# Patient Record
Sex: Male | Born: 1960 | Race: White | Hispanic: No | Marital: Single | Smoking: Never smoker
Health system: Southern US, Community
[De-identification: ages and names within clinical notes are randomized; demographics above are authoritative.]

## PROBLEM LIST (undated history)

## (undated) DIAGNOSIS — Z86718 Personal history of other venous thrombosis and embolism: Secondary | ICD-10-CM

## (undated) DIAGNOSIS — I10 Essential (primary) hypertension: Secondary | ICD-10-CM

## (undated) DIAGNOSIS — G4730 Sleep apnea, unspecified: Secondary | ICD-10-CM

## (undated) DIAGNOSIS — M109 Gout, unspecified: Secondary | ICD-10-CM

## (undated) DIAGNOSIS — K219 Gastro-esophageal reflux disease without esophagitis: Secondary | ICD-10-CM

## (undated) DIAGNOSIS — J45909 Unspecified asthma, uncomplicated: Secondary | ICD-10-CM

## (undated) DIAGNOSIS — M359 Systemic involvement of connective tissue, unspecified: Secondary | ICD-10-CM

## (undated) HISTORY — PX: UVULOPALATOPHARYNGOPLASTY: SHX827

## (undated) HISTORY — PX: ACHILLES TENDON REPAIR: SUR1153

## (undated) HISTORY — PX: EYE SURGERY: SHX253

---

## 1997-11-29 ENCOUNTER — Other Ambulatory Visit: Admission: RE | Admit: 1997-11-29 | Discharge: 1997-11-29 | Payer: Self-pay | Admitting: Otolaryngology

## 2005-04-21 ENCOUNTER — Emergency Department: Payer: Self-pay | Admitting: Emergency Medicine

## 2005-04-21 ENCOUNTER — Ambulatory Visit: Payer: Self-pay | Admitting: Podiatry

## 2006-07-05 ENCOUNTER — Emergency Department: Payer: Self-pay | Admitting: Emergency Medicine

## 2006-07-05 ENCOUNTER — Other Ambulatory Visit: Payer: Self-pay

## 2010-07-09 ENCOUNTER — Observation Stay: Payer: Self-pay | Admitting: Internal Medicine

## 2010-07-09 DIAGNOSIS — I517 Cardiomegaly: Secondary | ICD-10-CM

## 2011-05-24 ENCOUNTER — Emergency Department: Payer: Self-pay | Admitting: *Deleted

## 2011-05-24 LAB — BASIC METABOLIC PANEL
Anion Gap: 14 (ref 7–16)
Calcium, Total: 8.6 mg/dL (ref 8.5–10.1)
Chloride: 103 mmol/L (ref 98–107)
Co2: 25 mmol/L (ref 21–32)
Creatinine: 0.83 mg/dL (ref 0.60–1.30)
Osmolality: 284 (ref 275–301)
Potassium: 4.1 mmol/L (ref 3.5–5.1)

## 2011-05-24 LAB — CBC
HGB: 13.8 g/dL (ref 13.0–18.0)
MCHC: 34.5 g/dL (ref 32.0–36.0)
RBC: 4.54 10*6/uL (ref 4.40–5.90)
RDW: 13.8 % (ref 11.5–14.5)
WBC: 6.9 10*3/uL (ref 3.8–10.6)

## 2011-05-24 LAB — TROPONIN I: Troponin-I: <0.02 ng/mL

## 2011-06-02 ENCOUNTER — Ambulatory Visit: Payer: Self-pay | Admitting: Internal Medicine

## 2011-06-07 ENCOUNTER — Ambulatory Visit: Payer: Self-pay | Admitting: Internal Medicine

## 2011-12-23 ENCOUNTER — Ambulatory Visit: Payer: Self-pay | Admitting: Internal Medicine

## 2012-10-29 IMAGING — CT CT CHEST W/ CM
1 series · 15 of 31 positions shown, 19 images · IV contrast (isovue)
Comparison: None

REASON FOR EXAM: soft tissue mass rt lung seen on xray
COMMENTS:

PROCEDURE:     KCT - KCT CHEST WITH CONTRAST  - June 07, 2011 [DATE]
RESULT:     Indication: Soft tissue mass right lung
TECHNIQUE: Multiple axial images of the chest are obtained 85 mL of Isovue
300 intravenous contrast.

[Series 2: chest w/ 5.0 i41f 3 · axial · 0.96mm/px · z∈[-22,+283]mm · 15 of 67 slices shown, 19 images]
[im 3/67  mediastinal]
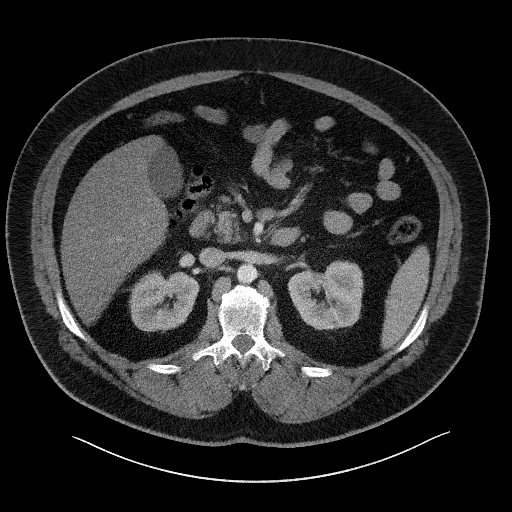
[im 3/67  lung]
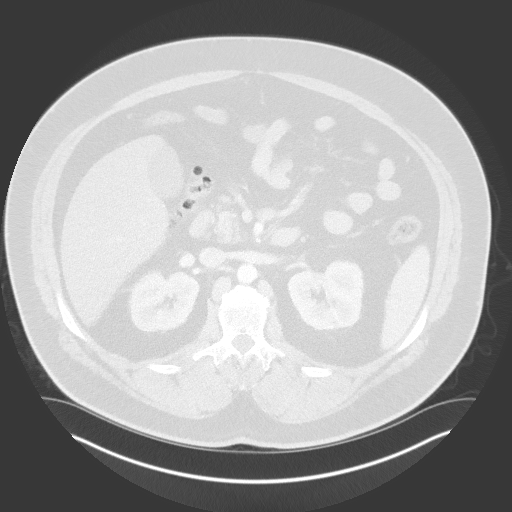
[im 8/67  lung]
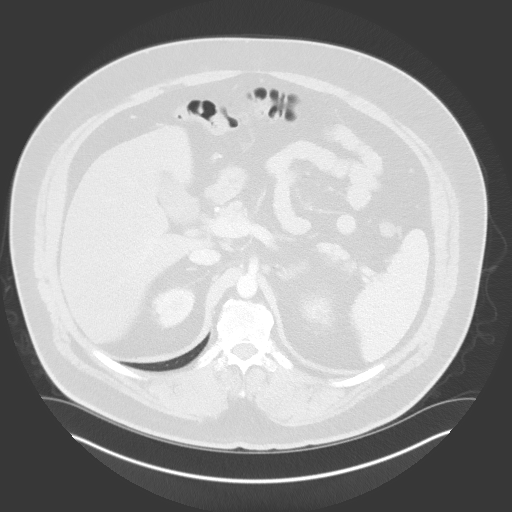
[im 13/67  lung]
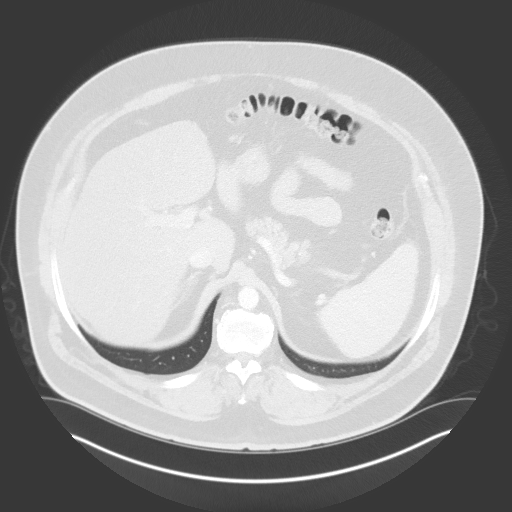
[im 15/67  lung]
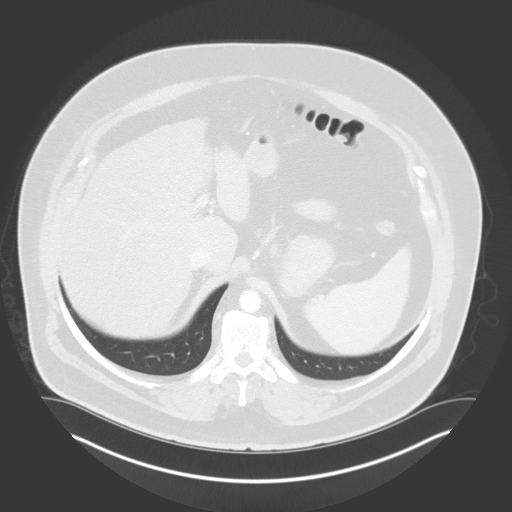
[im 20/67  mediastinal]
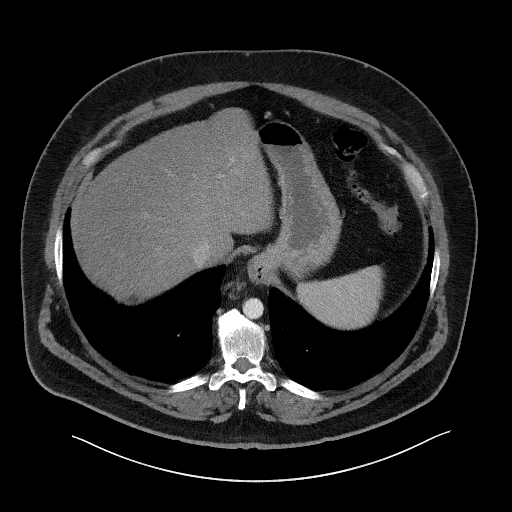
[im 20/67  lung]
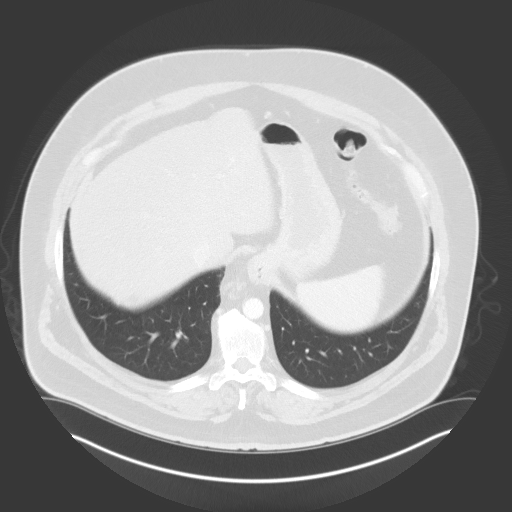
[im 25/67  lung]
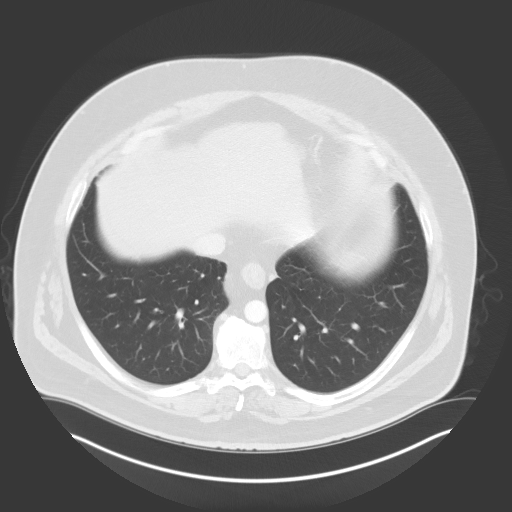
[im 30/67  lung]
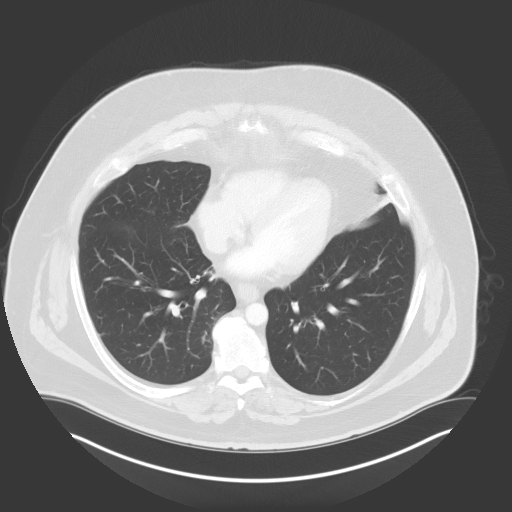
[im 35/67  lung]
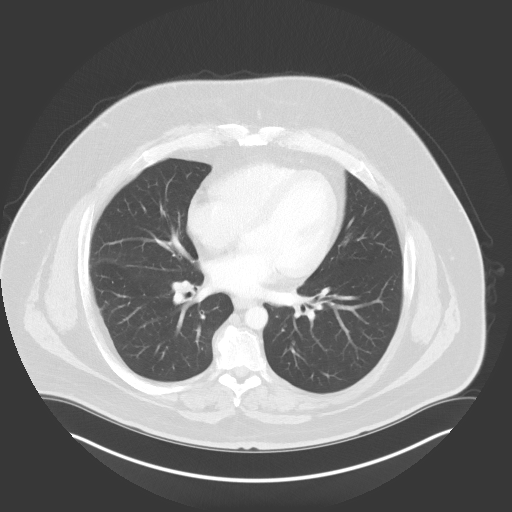
[im 37/67  mediastinal]
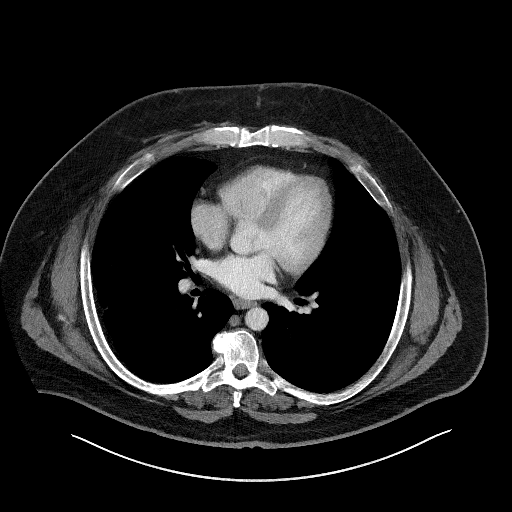
[im 37/67  lung]
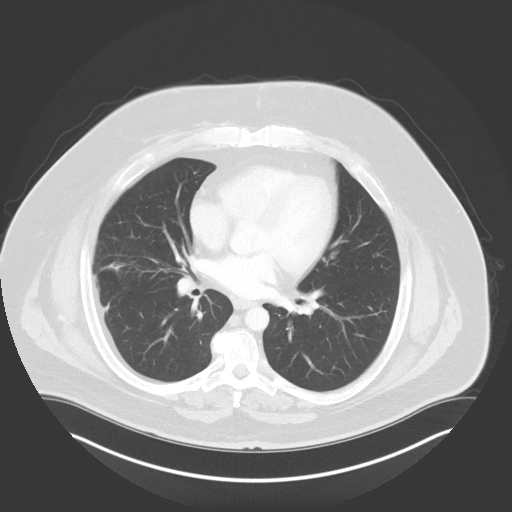
[im 42/67  lung]
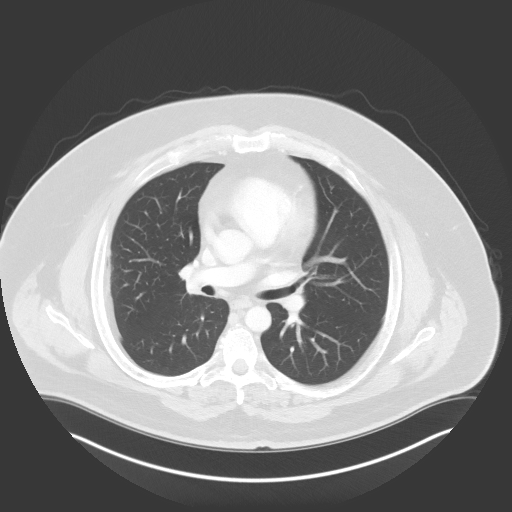
[im 47/67  lung]
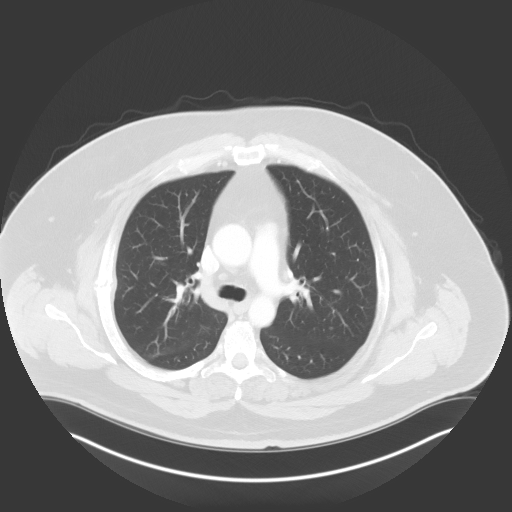
[im 52/67  lung]
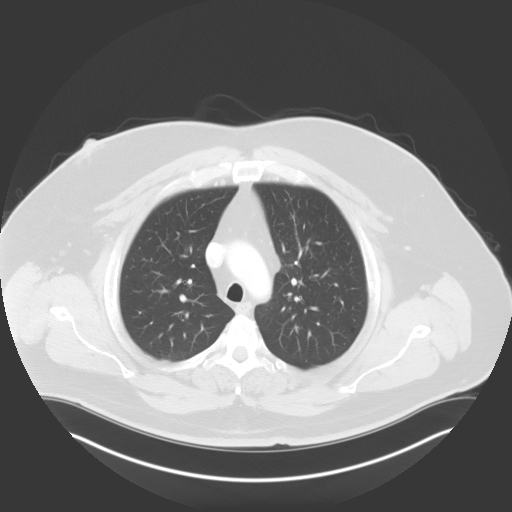
[im 54/67  mediastinal]
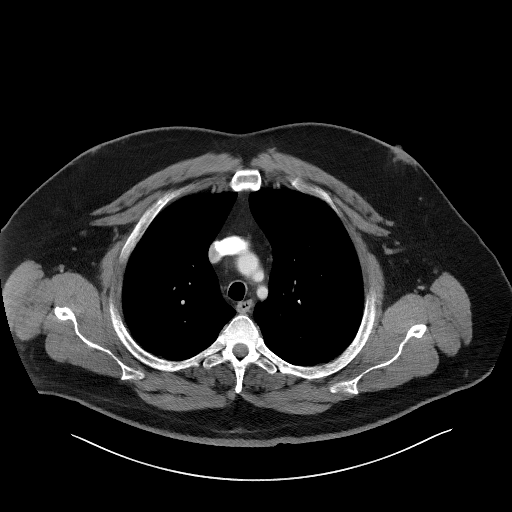
[im 54/67  lung]
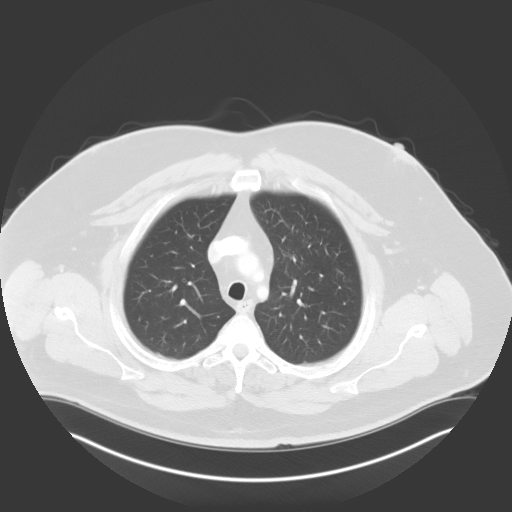
[im 59/67  lung]
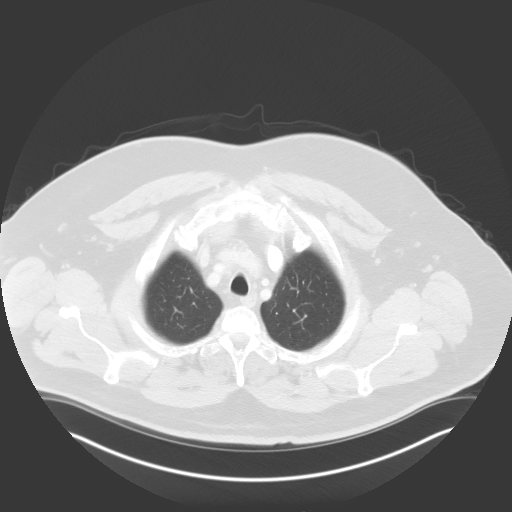
[im 64/67  lung]
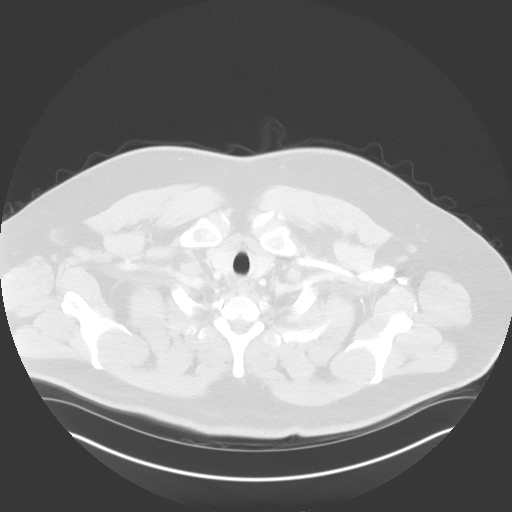

[15 of 31 positions shown; findings below may reference images not displayed]

FINDINGS: The central airways are patent. There is no pulmonary mass. There is no
focal consolidation. There is no pleural effusion or pneumothorax.

There are no pathologically enlarged axillary, hilar, or mediastinal lymph
nodes.

The heart size is normal. There is no pericardial effusion. The thoracic
aorta is normal in caliber.

Review of bone windows demonstrates no focal lytic or sclerotic lesions.
There is a nondisplaced fracture of the right lateral fifth rib with callus
formation a mild pleural thickening accounting for the lenticular shaped
pleural based opacity seen on recent radiograph.

Limited noncontrast images of the upper abdomen were obtained. The adrenal
glands appear normal. The liver is diffusely low in attenuation likely
secondary to hepatic steatosis..
IMPRESSION: 1.  There is a nondisplaced fracture of the right lateral fifth rib with
callus formation a mild pleural thickening accounting for the lenticular
shaped pleural based opacity seen on recent radiograph.

2. Hepatic steatosis.

## 2015-08-06 ENCOUNTER — Emergency Department: Payer: BLUE CROSS/BLUE SHIELD

## 2015-08-06 ENCOUNTER — Ambulatory Visit (INDEPENDENT_AMBULATORY_CARE_PROVIDER_SITE_OTHER)
Admission: EM | Admit: 2015-08-06 | Discharge: 2015-08-06 | Disposition: A | Discharge status: Other Acute Inpatient Hospital | Payer: BLUE CROSS/BLUE SHIELD | Source: Home / Self Care | Attending: Family Medicine | Admitting: Family Medicine

## 2015-08-06 ENCOUNTER — Observation Stay: Payer: BLUE CROSS/BLUE SHIELD

## 2015-08-06 ENCOUNTER — Encounter: Payer: Self-pay | Admitting: Emergency Medicine

## 2015-08-06 ENCOUNTER — Observation Stay (HOSPITAL_BASED_OUTPATIENT_CLINIC_OR_DEPARTMENT_OTHER)
Admit: 2015-08-06 | Discharge: 2015-08-06 | Disposition: A | Discharge status: Home / Self Care | Payer: BLUE CROSS/BLUE SHIELD | Attending: Internal Medicine | Admitting: Internal Medicine

## 2015-08-06 ENCOUNTER — Observation Stay
Admission: EM | Admit: 2015-08-06 | Discharge: 2015-08-07 | Disposition: A | Discharge status: Home / Self Care | Payer: BLUE CROSS/BLUE SHIELD | Attending: Internal Medicine | Admitting: Internal Medicine

## 2015-08-06 DIAGNOSIS — Z6841 Body Mass Index (BMI) 40.0 and over, adult: Secondary | ICD-10-CM | POA: Diagnosis not present

## 2015-08-06 DIAGNOSIS — R609 Edema, unspecified: Secondary | ICD-10-CM

## 2015-08-06 DIAGNOSIS — Z8249 Family history of ischemic heart disease and other diseases of the circulatory system: Secondary | ICD-10-CM | POA: Insufficient documentation

## 2015-08-06 DIAGNOSIS — G4730 Sleep apnea, unspecified: Secondary | ICD-10-CM | POA: Diagnosis not present

## 2015-08-06 DIAGNOSIS — Z566 Other physical and mental strain related to work: Secondary | ICD-10-CM | POA: Insufficient documentation

## 2015-08-06 DIAGNOSIS — M069 Rheumatoid arthritis, unspecified: Secondary | ICD-10-CM | POA: Diagnosis not present

## 2015-08-06 DIAGNOSIS — I890 Lymphedema, not elsewhere classified: Secondary | ICD-10-CM | POA: Insufficient documentation

## 2015-08-06 DIAGNOSIS — Z86718 Personal history of other venous thrombosis and embolism: Secondary | ICD-10-CM | POA: Diagnosis not present

## 2015-08-06 DIAGNOSIS — R0789 Other chest pain: Secondary | ICD-10-CM

## 2015-08-06 DIAGNOSIS — J45909 Unspecified asthma, uncomplicated: Secondary | ICD-10-CM | POA: Diagnosis not present

## 2015-08-06 DIAGNOSIS — J449 Chronic obstructive pulmonary disease, unspecified: Secondary | ICD-10-CM | POA: Diagnosis not present

## 2015-08-06 DIAGNOSIS — Z880 Allergy status to penicillin: Secondary | ICD-10-CM | POA: Diagnosis not present

## 2015-08-06 DIAGNOSIS — R079 Chest pain, unspecified: Secondary | ICD-10-CM

## 2015-08-06 DIAGNOSIS — E669 Obesity, unspecified: Secondary | ICD-10-CM | POA: Diagnosis not present

## 2015-08-06 DIAGNOSIS — M7989 Other specified soft tissue disorders: Secondary | ICD-10-CM | POA: Diagnosis not present

## 2015-08-06 HISTORY — DX: Systemic involvement of connective tissue, unspecified: M35.9

## 2015-08-06 HISTORY — DX: Personal history of other venous thrombosis and embolism: Z86.718

## 2015-08-06 HISTORY — DX: Unspecified asthma, uncomplicated: J45.909

## 2015-08-06 HISTORY — DX: Sleep apnea, unspecified: G47.30

## 2015-08-06 LAB — TROPONIN I
Troponin I: <0.03 ng/mL (ref <0.031)
Troponin I: <0.03 ng/mL (ref <0.031)

## 2015-08-06 LAB — FIBRIN DERIVATIVES D-DIMER (ARMC ONLY): Fibrin derivatives D-dimer (ARMC): 472 (ref 0–499)

## 2015-08-06 LAB — CREATININE, SERUM
CREATININE: 0.88 mg/dL (ref 0.61–1.24)
GFR calc non Af Amer: >60 mL/min (ref >60)

## 2015-08-06 LAB — BASIC METABOLIC PANEL
Anion gap: 4 — ABNORMAL LOW (ref 5–15)
BUN: 19 mg/dL (ref 6–20)
CALCIUM: 9 mg/dL (ref 8.9–10.3)
CO2: 28 mmol/L (ref 22–32)
CREATININE: 0.95 mg/dL (ref 0.61–1.24)
Chloride: 106 mmol/L (ref 101–111)
GFR calc non Af Amer: >60 mL/min (ref >60)
Glucose, Bld: 101 mg/dL — ABNORMAL HIGH (ref 65–99)
Potassium: 4.1 mmol/L (ref 3.5–5.1)
SODIUM: 138 mmol/L (ref 135–145)

## 2015-08-06 LAB — CBC
HCT: 40.7 % (ref 40.0–52.0)
Hemoglobin: 13.5 g/dL (ref 13.0–18.0)
MCH: 29.1 pg (ref 26.0–34.0)
MCHC: 33.3 g/dL (ref 32.0–36.0)
MCV: 87.6 fL (ref 80.0–100.0)
PLATELETS: 210 10*3/uL (ref 150–440)
RBC: 4.65 MIL/uL (ref 4.40–5.90)
RDW: 13.8 % (ref 11.5–14.5)
WBC: 7 10*3/uL (ref 3.8–10.6)

## 2015-08-06 LAB — LIPID PANEL
CHOL/HDL RATIO: 5.1 ratio
Cholesterol: 158 mg/dL (ref 0–200)
HDL: 31 mg/dL — ABNORMAL LOW (ref >40)
LDL CALC: 87 mg/dL (ref 0–99)
TRIGLYCERIDES: 200 mg/dL — AB (ref <150)
VLDL: 40 mg/dL (ref 0–40)

## 2015-08-06 MED ORDER — GI COCKTAIL ~~LOC~~
30.0000 mL | Freq: Once | ORAL | Status: AC
  Administered 2015-08-06: 30 mL via ORAL
  Filled 2015-08-06: qty 30

## 2015-08-06 MED ORDER — ASPIRIN EC 325 MG PO TBEC: 325.0000 mg | DELAYED_RELEASE_TABLET | Freq: Every day | ORAL | Status: DC

## 2015-08-06 MED ORDER — MOMETASONE FURO-FORMOTEROL FUM 200-5 MCG/ACT IN AERO
2.0000 | INHALATION_SPRAY | Freq: Two times a day (BID) | RESPIRATORY_TRACT | Status: DC
  Administered 2015-08-06: 2 via RESPIRATORY_TRACT
  Filled 2015-08-06 (×2): qty 8.8

## 2015-08-06 MED ORDER — ASPIRIN 81 MG PO CHEW
324.0000 mg | CHEWABLE_TABLET | Freq: Once | ORAL | Status: AC
  Administered 2015-08-06: 324 mg via ORAL
  Filled 2015-08-06: qty 4

## 2015-08-06 MED ORDER — SODIUM CHLORIDE 0.9% FLUSH
3.0000 mL | Freq: Two times a day (BID) | INTRAVENOUS | Status: DC
  Administered 2015-08-06: 3 mL via INTRAVENOUS

## 2015-08-06 MED ORDER — ALBUTEROL SULFATE (2.5 MG/3ML) 0.083% IN NEBU: 3.0000 mL | INHALATION_SOLUTION | Freq: Four times a day (QID) | RESPIRATORY_TRACT | Status: DC | PRN

## 2015-08-06 MED ORDER — IOPAMIDOL (ISOVUE-370) INJECTION 76%
100.0000 mL | Freq: Once | INTRAVENOUS | Status: AC | PRN
  Administered 2015-08-06: 100 mL via INTRAVENOUS

## 2015-08-06 MED ORDER — NITROGLYCERIN 0.4 MG SL SUBL: 0.4000 mg | SUBLINGUAL_TABLET | SUBLINGUAL | Status: DC | PRN

## 2015-08-06 MED ORDER — HEPARIN SODIUM (PORCINE) 5000 UNIT/ML IJ SOLN
5000.0000 [IU] | Freq: Three times a day (TID) | INTRAMUSCULAR | Status: DC
  Administered 2015-08-06 – 2015-08-07 (×2): 5000 [IU] via SUBCUTANEOUS
  Filled 2015-08-06 (×2): qty 1

## 2015-08-06 NOTE — Discharge Instructions (Signed)
Chest Pain Observation °It is often hard to give a specific diagnosis for the cause of chest pain. Among other possibilities your symptoms might be caused by inadequate oxygen delivery to your heart (angina). Angina that is not treated or evaluated can lead to a heart attack (myocardial infarction) or death. °Blood tests, electrocardiograms, and X-rays may have been done to help determine a possible cause of your chest pain. After evaluation and observation, your health care provider has determined that it is unlikely your pain was caused by an unstable condition that requires hospitalization. However, a full evaluation of your pain may need to be completed, with additional diagnostic testing as directed. It is very important to keep your follow-up appointments. Not keeping your follow-up appointments could result in permanent heart damage, disability, or death. If there is any problem keeping your follow-up appointments, you must call your health care provider. °HOME CARE INSTRUCTIONS  °Due to the slight chance that your pain could be angina, it is important to follow your health care provider's treatment plan and also maintain a healthy lifestyle: °· Maintain or work toward achieving a healthy weight. °· Stay physically active and exercise regularly. °· Decrease your salt intake. °· Eat a balanced, healthy diet. Talk to a dietitian to learn about heart-healthy foods. °· Increase your fiber intake by including whole grains, vegetables, fruits, and nuts in your diet. °· Avoid situations that cause stress, anger, or depression. °· Take medicines as advised by your health care provider. Report any side effects to your health care provider. Do not stop medicines or adjust the dosages on your own. °· Quit smoking. Do not use nicotine patches or gum until you check with your health care provider. °· Keep your blood pressure, blood sugar, and cholesterol levels within normal limits. °· Limit alcohol intake to no more than  1 drink per day for women who are not pregnant and 2 drinks per day for men. °· Do not abuse drugs. °SEEK IMMEDIATE MEDICAL CARE IF: °You have severe chest pain or pressure which may include symptoms such as: °· You feel pain or pressure in your arms, neck, jaw, or back. °· You have severe back or abdominal pain, feel sick to your stomach (nauseous), or throw up (vomit). °· You are sweating profusely. °· You are having a fast or irregular heartbeat. °· You feel short of breath while at rest. °· You notice increasing shortness of breath during rest, sleep, or with activity. °· You have chest pain that does not get better after rest or after taking your usual medicine. °· You wake from sleep with chest pain. °· You are unable to sleep because you cannot breathe. °· You develop a frequent cough or you are coughing up blood. °· You feel dizzy, faint, or experience extreme fatigue. °· You develop severe weakness, dizziness, fainting, or chills. °Any of these symptoms may represent a serious problem that is an emergency. Do not wait to see if the symptoms will go away. Call your local emergency services (911 in the [place] Do not drive yourself to the hospital. °MAKE SURE YOU: °· Understand these instructions. °· Will watch your condition. °· Will get help right away if you are not doing well or get worse. °  °This information is not intended to replace advice given to you by your health care provider. Make sure you discuss any questions you have with your health care provider. °  °Document Released: 04/03/2010 Document Revised: 03/06/2013 Document Reviewed: 08/31/2012 °Elsevier Interactive Patient   Education 2016 [employer]   Nonspecific Chest Pain It is often hard to find the cause of chest pain. There is always a chance that your pain could be related to something serious, such as a heart attack or a blood clot in your lungs. Chest pain can also be caused by conditions that are not life-threatening. If you have  chest pain, it is very important to follow up with your doctor.  HOME CARE  If you were prescribed an antibiotic medicine, finish it all even if you start to feel better.  Avoid any activities that cause chest pain.  Do not use any tobacco products, including cigarettes, chewing tobacco, or electronic cigarettes. If you need help quitting, ask your doctor.  Do not drink alcohol.  Take medicines only as told by your doctor.  Keep all follow-up visits as told by your doctor. This is important. This includes any further testing if your chest pain does not go away.  Your doctor may tell you to keep your head raised (elevated) while you sleep.  Make lifestyle changes as told by your doctor. These may include:  Getting regular exercise. Ask your doctor to suggest some activities that are safe for you.  Eating a heart-healthy diet. Your doctor or a diet specialist (dietitian) can help you to learn healthy eating options.  Maintaining a healthy weight.  Managing diabetes, if necessary.  Reducing stress. GET HELP IF:  Your chest pain does not go away, even after treatment.  You have a rash with blisters on your chest.  You have a fever. GET HELP RIGHT AWAY IF:  Your chest pain is worse.  You have an increasing cough, or you cough up blood.  You have severe belly (abdominal) pain.  You feel extremely weak.  You pass out (faint).  You have chills.  You have sudden, unexplained chest discomfort.  You have sudden, unexplained discomfort in your arms, back, neck, or jaw.  You have shortness of breath at any time.  You suddenly start to sweat, or your skin gets clammy.  You feel nauseous.  You vomit.  You suddenly feel light-headed or dizzy.  Your heart begins to beat quickly, or it feels like it is skipping beats. These symptoms may be an emergency. Do not wait to see if the symptoms will go away. Get medical help right away. Call your local emergency services (911  in the [place] Do not drive yourself to the hospital.   This information is not intended to replace advice given to you by your health care provider. Make sure you discuss any questions you have with your health care provider.   Document Released: 08/18/2007 Document Revised: 03/22/2014 Document Reviewed: 10/05/2013 Elsevier Interactive Patient Education [employer].

## 2015-08-06 NOTE — ED Notes (Signed)
Pt reports taking two Aspirin 325mg  p.o. Just prior to arrival to urgent care.

## 2015-08-06 NOTE — H&P (Signed)
[facility] [place] NAME: Sean Garza    MR#:  [id number]  DATE OF BIRTH:  30-Jul-1960  DATE OF ADMISSION:  08/06/2015  PRIMARY CARE PHYSICIAN: Albina Billet, MD   REQUESTING/REFERRING PHYSICIAN: Conni Slipper  CHIEF COMPLAINT:   Chief Complaint  Patient presents with  . Chest Pain    HISTORY OF PRESENT ILLNESS: Sean Garza  is a 55 y.o. 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 Today morning while he was at work started having serious substernal chest pain, which was pressure-like, as if somebody sitting on his chest. The pain was not associated with any activities, the breathing, movements. As the pain continued he took off from his work and went to urgent care Center after taking 2 aspirin tablets. Listening to his symptoms is because center suggested to go to emergency room right away and so he came here. His 2 troponins are negative, but ER physician spoke to on call cardiologist and patient has family history of coronary artery disease in his father and brother at almost same age as patient is having currently. So cardiology suggested to observe him in the hospital and possibly stress test tomorrow.  Patient's d-dimer is high but the CT scan of the chest is not done yet, and he also had complain of on and off swelling on his right leg since the last 11 years when he was diagnosed with DVT.  PAST MEDICAL HISTORY:   Past Medical History  Diagnosis Date  . Asthma   . Hx of blood clots   . Sleep apnea     PAST SURGICAL HISTORY: Past Surgical History  Procedure Laterality Date  . Achilles tendon repair      SOCIAL HISTORY:  Social History  Substance Use Topics  . Smoking status: Former Research scientist (life sciences)  . Smokeless tobacco: Not on file  . Alcohol Use: No    FAMILY HISTORY:  Family History  Problem Relation Age of Onset  . CAD Father   . CAD Brother      DRUG ALLERGIES:  Allergies  Allergen Reactions  . Penicillins Other (See Comments)    Reaction:  Unknown     REVIEW OF SYSTEMS:   CONSTITUTIONAL: No fever, fatigue or weakness.  EYES: No blurred or double vision.  EARS, NOSE, AND THROAT: No tinnitus or ear pain.  RESPIRATORY: No cough, shortness of breath, wheezing or hemoptysis.  CARDIOVASCULAR: positive for chest pain, orthopnea, edema.  GASTROINTESTINAL: No nausea, vomiting, diarrhea or abdominal pain.  GENITOURINARY: No dysuria, hematuria.  ENDOCRINE: No polyuria, nocturia,  HEMATOLOGY: No anemia, easy bruising or bleeding SKIN: No rash or lesion. MUSCULOSKELETAL: No joint pain or arthritis. Swelling.   NEUROLOGIC: No tingling, numbness, weakness.  PSYCHIATRY: No anxiety or depression.   MEDICATIONS AT HOME:  Prior to Admission medications   Medication Sig Start Date End Date Taking? Authorizing Provider  albuterol (PROVENTIL HFA;VENTOLIN HFA) 108 (90 Base) MCG/ACT inhaler Inhale into the lungs every 6 (six) hours as needed for wheezing or shortness of breath.    Historical Provider, MD  budesonide-formoterol (SYMBICORT) 160-4.5 MCG/ACT inhaler Inhale 2 puffs into the lungs 2 (two) times daily.    Historical Provider, MD  sildenafil (REVATIO) 20 MG tablet Take 20 mg by mouth 3 (three) times daily.    Historical Provider, MD      PHYSICAL EXAMINATION:   VITAL SIGNS: Blood pressure 122/82, pulse  68, temperature 97.8 F (36.6 C), temperature source Oral, resp. rate 16, height 5\' 8"  (1.727 m), weight 133.358 kg (294 lb), SpO2 97 %.  GENERAL:  55 y.o.-year-old patient lying in the bed with no acute distress.  EYES: Pupils equal, round, reactive to light and accommodation. No scleral icterus. Extraocular muscles intact.  HEENT: Head atraumatic, normocephalic. Oropharynx and nasopharynx clear.  NECK:  Supple, no jugular venous distention. No thyroid enlargement, no tenderness.  LUNGS: Normal breath sounds bilaterally, no  wheezing, rales,rhonchi or crepitation. No use of accessory muscles of respiration.  CARDIOVASCULAR: S1, S2 normal. No murmurs, rubs, or gallops.  ABDOMEN: Soft, nontender, nondistended. Bowel sounds present. No organomegaly or mass.  EXTREMITIES: No pedal edema, cyanosis, or clubbing.  Right leg is swollen, nonpitting, patient says it is chronically swollen. NEUROLOGIC: Cranial nerves II through XII are intact. Muscle strength 5/5 in all extremities. Sensation intact. Gait not checked.  PSYCHIATRIC: The patient is alert and oriented x 3.  SKIN: No obvious rash, lesion, or ulcer.   LABORATORY PANEL:   CBC  Recent Labs Lab 08/06/15 1318  WBC 7.0  HGB 13.5  HCT 40.7  PLT 210  MCV 87.6  MCH 29.1  MCHC 33.3  RDW 13.8   ------------------------------------------------------------------------------------------------------------------  Chemistries   Recent Labs Lab 08/06/15 1318  NA 138  K 4.1  CL 106  CO2 28  GLUCOSE 101*  BUN 19  CREATININE 0.95  CALCIUM 9.0   ------------------------------------------------------------------------------------------------------------------ estimated creatinine clearance is 118.7 mL/min (by C-G formula based on Cr of 0.95). ------------------------------------------------------------------------------------------------------------------ No results for input(s): TSH, T4TOTAL, T3FREE, THYROIDAB in the last 72 hours.  Invalid input(s): FREET3   Coagulation profile No results for input(s): INR, PROTIME in the last 168 hours. ------------------------------------------------------------------------------------------------------------------- No results for input(s): DDIMER in the last 72 hours. -------------------------------------------------------------------------------------------------------------------  Cardiac Enzymes  Recent Labs Lab 08/06/15 1318 08/06/15 1527  TROPONINI <0.03 <0.03    ------------------------------------------------------------------------------------------------------------------ Invalid input(s): POCBNP  ---------------------------------------------------------------------------------------------------------------  Urinalysis No results found for: COLORURINE, APPEARANCEUR, LABSPEC, PHURINE, GLUCOSEU, HGBUR, BILIRUBINUR, KETONESUR, PROTEINUR, UROBILINOGEN, NITRITE, LEUKOCYTESUR   RADIOLOGY: Dg Chest 2 View  08/06/2015  CLINICAL DATA:  Left side chest pain, dizziness starting 8 a.m. this morning EXAM: CHEST  2 VIEW COMPARISON:  06/07/2011 FINDINGS: Cardiomediastinal silhouette is stable. No acute infiltrate or pleural effusion. No pulmonary edema. Osteopenia and mild degenerative changes thoracic spine. IMPRESSION: No active cardiopulmonary disease. Electronically Signed   By: Lahoma Crocker M.D.   On: 08/06/2015 13:55    EKG: Orders placed or performed during the hospital encounter of 08/06/15  . ED EKG within 10 minutes  . ED EKG within 10 minutes  Normal sinus rhythm  IMPRESSION AND PLAN:  * Chest pain   As patient has strong family history and his ovaries, cardiology suggested to observe on telemetry and do a nuclear stress test tomorrow.   We'll follow serial troponin, give aspirin, check his lipid panel and hemoglobin A1c.  * Elevated d-dimer   Patient has history of DVT on the right leg 11 years ago, he did not took anticoagulation for more than 2 months at that time, since then he is on and off swelling of his right leg.   I will do a CT chest angiogram for pulmonary embolism and her DVT study on the right lower extremity.  * Asthma   No exacerbation, continue inhalers as home.   All the records are reviewed and case discussed with ED provider. Management plans discussed with the patient, family and they are  in agreement.  CODE STATUS: Full code Code Status History    This patient does not have a recorded code status. Please follow  your organizational policy for patients in this situation.       TOTAL TIME TAKING CARE OF THIS PATIENT: 50 minutes.    Vaughan Basta M.D on 08/06/2015   Between 7am to 6pm - Pager - [phone]  After 6pm go to [email or web address] - password [email or web address] [facility]  Office  [phone]  CC: Primary care physician; Albina Billet, MD   Note: This dictation was prepared with Dragon dictation along with smaller phrase technology. Any transcriptional errors that result from this process are unintentional.

## 2015-08-06 NOTE — ED Notes (Signed)
Pt resting in bed, family at bedside, pt in no acute distress 

## 2015-08-06 NOTE — ED Notes (Signed)
Patient transported to CT 

## 2015-08-06 NOTE — ED Provider Notes (Signed)
-----------------------------------------   5:31 PM on 08/06/2015 -----------------------------------------  Patient continues with chest pain. Patient's second troponin is negative. Patient has a very strong family history including his father with multiple MIs, brother of multiple MIs who has passed away. I discussed the patient with cardiology. I dosed a GI cocktail, pain remains. Given the patient's very strong family history and continued chest pain we will admit the patient to the hospital for further workup and evaluation. I discussed the patient with cardiology who is in agreement to this plan.  Harvest Dark, MD 08/06/15 [phone]

## 2015-08-06 NOTE — Progress Notes (Signed)
Patient admitted to unit from ED for chest pain/chest pressure, Patient alert and oriented, denies any pain at this time, VSS, family at bedside, Patient oriented to room and call system will continue to monitor

## 2015-08-06 NOTE — ED Provider Notes (Signed)
[facility] Emergency Department Provider Note   ____________________________________________  Time seen: Approximately 3:45 PM  I have reviewed the triage vital signs and the nursing notes.   HISTORY  Chief Complaint Chest Pain    HPI Sean Garza is a 55 y.o. male sent from urgent care. Patient reports he was at work and had a sharp stabbing chest pain lasted just a few seconds but since then he's had a dull achy heaviness in his chest ever since. He denies any shortness of breath he says what he usually has with his asthma denies any nausea vomiting or sweating. He is somewhat lightheaded he says. He reports his father had an MI at about his age and his brother had 2 MIs at about his age as well.  Past Medical History  Diagnosis Date  . Asthma   . Hx of blood clots   . Sleep apnea     There are no active problems to display for this patient.   Past Surgical History  Procedure Laterality Date  . Achilles tendon repair      Current Outpatient Rx  Name  Route  Sig  Dispense  Refill  . albuterol (PROVENTIL HFA;VENTOLIN HFA) 108 (90 Base) MCG/ACT inhaler   Inhalation   Inhale into the lungs every 6 (six) hours as needed for wheezing or shortness of breath.         . budesonide-formoterol (SYMBICORT) 160-4.5 MCG/ACT inhaler   Inhalation   Inhale 2 puffs into the lungs 2 (two) times daily.         . sildenafil (REVATIO) 20 MG tablet   Oral   Take 20 mg by mouth 3 (three) times daily.           Allergies Penicillins  No family history on file.  Social History Social History  Substance Use Topics  . Smoking status: Former Research scientist (life sciences)  . Smokeless tobacco: None  . Alcohol Use: No    Review of Systems Constitutional: No fever/chills Eyes: No visual changes. ENT: No sore throat. Cardiovascular: See history of present illness Respiratory: Denies shortness of breath. Gastrointestinal: No abdominal pain.  No nausea, no vomiting.   No diarrhea.  No constipation. Genitourinary: Negative for dysuria. Musculoskeletal: Negative for back pain. Skin: Negative for rash. Neurological: Negative for headaches, focal weakness or numbness. } 10-point ROS otherwise negative.  ____________________________________________   PHYSICAL EXAM:  VITAL SIGNS: ED Triage Vitals  Enc Vitals Group     BP 08/06/15 1439 144/91 mmHg     Pulse Rate 08/06/15 1320 71     Resp 08/06/15 1320 15     Temp 08/06/15 1320 97.8 F (36.6 C)     Temp Source 08/06/15 1320 Oral     SpO2 08/06/15 1320 97 %     Weight 08/06/15 1320 294 lb (133.358 kg)     Height 08/06/15 1320 5\' 8"  (1.727 m)     Head Cir --      Peak Flow --      Pain Score 08/06/15 1321 2     Pain Loc --      Pain Edu? --      Excl. in [place]? --     Constitutional: Alert and oriented. Well appearing and in no acute distress. Eyes: Conjunctivae are normal. PERRL. EOMI. Head: Atraumatic. Nose: No congestion/rhinnorhea. Mouth/Throat: Mucous membranes are moist.  Oropharynx non-erythematous. Neck: No stridor.   Cardiovascular: Normal rate, regular rhythm. Grossly normal heart sounds.  Good peripheral circulation.  Respiratory: Normal respiratory effort.  No retractions. Lungs CTAB. Gastrointestinal: Soft and nontender. No distention. No abdominal bruits. No CVA tenderness. {Musculoskeletal: No lower extremity tenderness nor edema.  No joint effusions. Neurologic:  Normal speech and language. No gross focal neurologic deficits are appreciated. No gait instability. Skin:  Skin is warm, dry and intact. No rash noted. Psychiatric: Mood and affect are normal. Speech and behavior are normal.  ____________________________________________   LABS (all labs ordered are listed, but only abnormal results are displayed)  Labs Reviewed  BASIC METABOLIC PANEL - Abnormal; Notable for the following:    Glucose, Bld 101 (*)    Anion gap 4 (*)    All other components within normal limits    CBC  TROPONIN I  FIBRIN DERIVATIVES D-DIMER ([facility] ONLY)  TROPONIN I   ____________________________________________  EKG EKG read and interpreted by me shows normal sinus rhythm rate of 72 normal axis no acute ST-T wave changes looks very similar to the EKG from urgent care which also looks very similar to an EKG from 05/24/2011. ____________________________________________  RADIOLOGY chest x-ray read by radiology as no acute disease ____________________________________________   PROCEDURES    ____________________________________________   INITIAL IMPRESSION / ASSESSMENT AND PLAN / ED COURSE  Pertinent labs & imaging results that were available during my care of the patient were reviewed by me and considered in my medical decision making (see chart for details).  I'm currently waiting for the second troponin plan is if this is negative we'll refer him to cardiology for follow-up tomorrow after discussing with them Dr. Ellyn Hack is on-call for unassigned patients. I will likely sign this patient out to Dr. Jacklynn Bue. ____________________________________________   FINAL CLINICAL IMPRESSION(S) / ED DIAGNOSES  Final diagnoses:  Chest pain, unspecified chest pain type      NEW MEDICATIONS STARTED DURING THIS VISIT:  New Prescriptions   No medications on file     Note:  This document was prepared using Dragon voice recognition software and may include unintentional dictation errors.    Nena Polio, MD 08/06/15 [phone]

## 2015-08-06 NOTE — ED Notes (Signed)
Pt comes into the ED via EMS from [facility] urgent care with the c/o chest pain that was initially a sharp pain centrally to the chest and then throughout the day it has become pressure.  Patient states he has mild dizziness today with the initial episode.  Patient in NAD at this time with even and unlabored respirations.

## 2015-08-06 NOTE — ED Provider Notes (Signed)
CSN: [id number]     Arrival date & time 08/06/15  1203 History   First MD Initiated Contact with Patient 08/06/15 1243    Nurses notes were reviewed. Chief Complaint  Patient presents with  . Chest Pain    Mid sternal to left sided chest pain starting at 0830 with a sharp pain and then feels heavy. Pain 3/10. Denies nausea or radiation.    Patient's here because of chest pain that was described as being substernal. He reports waking up this morning with substernal chest pain that was very sharp and intense. He had stabbing pain diaphoresis. The pain lasted several minutes since then he's had a heaviness and fullness in his chest they cannot shake. He describes stiffness as though something sitting on his chest. He denies any nausea vomiting and no radiation with pain. He did take 2 full aspirins before he came here. He does take low-dose Viagra but states she's not had it for several days. Past history denies hypertension diabetes he is obese and has a history of asthma and history of blood clots. He is a former smoker. And the strong family history of heart disease and a father and brother     (Consider location/radiation/quality/duration/timing/severity/associated sxs/prior Treatment) Patient is a 55 y.o. male presenting with chest pain. The history is provided by the patient, a significant other and a relative. No language interpreter was used.  Chest Pain Pain location:  Substernal area Pain quality: pressure, stabbing and tightness   Pain quality: not radiating   Pain radiates to:  Does not radiate Pain radiates to the back: no   Pain severity:  Moderate Onset quality:  Sudden Timing:  Constant Progression:  Waxing and waning Chronicity:  New Relieved by:  Nothing Worsened by:  Nothing tried Risk factors: male sex, obesity and prior DVT/PE     Past Medical History  Diagnosis Date  . Asthma   . Hx of blood clots    Past Surgical History  Procedure Laterality Date  . Achilles  tendon repair     History reviewed. No pertinent family history. Social History  Substance Use Topics  . Smoking status: Former Research scientist (life sciences)  . Smokeless tobacco: None  . Alcohol Use: No    Review of Systems  Cardiovascular: Positive for chest pain.  All other systems reviewed and are negative.   Allergies  Penicillins  Home Medications   Prior to Admission medications   Medication Sig Start Date End Date Taking? Authorizing Provider  albuterol (PROVENTIL HFA;VENTOLIN HFA) 108 (90 Base) MCG/ACT inhaler Inhale into the lungs every 6 (six) hours as needed for wheezing or shortness of breath.   Yes Historical Provider, MD  budesonide-formoterol (SYMBICORT) 160-4.5 MCG/ACT inhaler Inhale 2 puffs into the lungs 2 (two) times daily.   Yes Historical Provider, MD  sildenafil (REVATIO) 20 MG tablet Take 20 mg by mouth 3 (three) times daily.   Yes Historical Provider, MD   Meds Ordered and Administered this Visit  Medications - No data to display  BP 142/83 mmHg  Pulse 76  Temp(Src) 98.5 F (36.9 C) (Tympanic)  Resp 20  Ht 5\' 8"  (1.727 m)  Wt 300 lb (136.079 kg)  BMI 45.63 kg/m2  SpO2 97% No data found.   Physical Exam  Constitutional: He is oriented to person, place, and time. He appears well-developed and well-nourished.  Obese white male  HENT:  Head: Normocephalic and atraumatic.  Eyes: Conjunctivae are normal. Pupils are equal, round, and reactive to light.  Neck:  Normal range of motion. Neck supple.  Cardiovascular: Normal rate, regular rhythm and normal heart sounds.   Pulmonary/Chest: Effort normal and breath sounds normal.  Abdominal: Soft.  Musculoskeletal: Normal range of motion. He exhibits no tenderness.  Neurological: He is alert and oriented to person, place, and time.  Skin: Skin is warm and dry. No erythema.  Psychiatric: He has a normal mood and affect.  Vitals reviewed.   ED Course  Procedures (including critical care time)  Labs Review Labs Reviewed  - No data to display  Imaging Review No results found.   Visual Acuity Review  Right Eye Distance:   Left Eye Distance:   Bilateral Distance:    Right Eye Near:   Left Eye Near:    Bilateral Near:         MDM   1. Chest pain of uncertain etiology    Patient will be transferred to [facility] ED. Report given to nurse covering for charge nurse Marya Amsler at [facility] ED. Explained to patient significant other and daughter risk factors age weight and EKGs still has some worsening changes with Q-wave in 3 aVF and some prolongation of the QRS complex.  ED ECG REPORT I, Shyia Fillingim H, the attending physician, personally viewed and interpreted this ECG.   Date: 08/06/2015  EKG  Time12:07:25  Rate:77  Rhythm: there are no previous tracings available for comparison, normal sinus rhythm  Axis: 45  Intervals:none  ST&T Change: none EKG with Q waves in 3 and aVF also some prolongation of QRS complex.  Note: This dictation was prepared with Dragon dictation along with smaller phrase technology. Any transcriptional errors that result from this process are unintentional.    Frederich Cha, MD 08/06/15 1303

## 2015-08-06 NOTE — ED Notes (Signed)
Pt return from xray, resting in bed, family at bedside

## 2015-08-06 NOTE — ED Notes (Signed)
Patient transported to X-ray 

## 2015-08-07 ENCOUNTER — Observation Stay (HOSPITAL_BASED_OUTPATIENT_CLINIC_OR_DEPARTMENT_OTHER): Payer: BLUE CROSS/BLUE SHIELD

## 2015-08-07 ENCOUNTER — Encounter: Payer: Self-pay | Admitting: Radiology

## 2015-08-07 ENCOUNTER — Other Ambulatory Visit: Payer: BLUE CROSS/BLUE SHIELD

## 2015-08-07 ENCOUNTER — Observation Stay: Payer: BLUE CROSS/BLUE SHIELD

## 2015-08-07 DIAGNOSIS — G4730 Sleep apnea, unspecified: Secondary | ICD-10-CM | POA: Insufficient documentation

## 2015-08-07 DIAGNOSIS — M7989 Other specified soft tissue disorders: Secondary | ICD-10-CM | POA: Insufficient documentation

## 2015-08-07 DIAGNOSIS — Z8249 Family history of ischemic heart disease and other diseases of the circulatory system: Secondary | ICD-10-CM

## 2015-08-07 DIAGNOSIS — J449 Chronic obstructive pulmonary disease, unspecified: Secondary | ICD-10-CM | POA: Insufficient documentation

## 2015-08-07 DIAGNOSIS — Z880 Allergy status to penicillin: Secondary | ICD-10-CM | POA: Insufficient documentation

## 2015-08-07 DIAGNOSIS — E669 Obesity, unspecified: Secondary | ICD-10-CM

## 2015-08-07 DIAGNOSIS — J45909 Unspecified asthma, uncomplicated: Secondary | ICD-10-CM | POA: Insufficient documentation

## 2015-08-07 DIAGNOSIS — R609 Edema, unspecified: Secondary | ICD-10-CM

## 2015-08-07 DIAGNOSIS — Z566 Other physical and mental strain related to work: Secondary | ICD-10-CM

## 2015-08-07 DIAGNOSIS — R079 Chest pain, unspecified: Secondary | ICD-10-CM

## 2015-08-07 DIAGNOSIS — Z6841 Body Mass Index (BMI) 40.0 and over, adult: Secondary | ICD-10-CM | POA: Insufficient documentation

## 2015-08-07 DIAGNOSIS — Z86718 Personal history of other venous thrombosis and embolism: Secondary | ICD-10-CM

## 2015-08-07 DIAGNOSIS — I890 Lymphedema, not elsewhere classified: Secondary | ICD-10-CM | POA: Insufficient documentation

## 2015-08-07 DIAGNOSIS — M069 Rheumatoid arthritis, unspecified: Secondary | ICD-10-CM | POA: Insufficient documentation

## 2015-08-07 DIAGNOSIS — R0789 Other chest pain: Principal | ICD-10-CM | POA: Insufficient documentation

## 2015-08-07 LAB — NM MYOCAR MULTI W/SPECT W/WALL MOTION / EF
CHL CUP RESTING HR STRESS: 61 {beats}/min
CSEPED: 8 min
CSEPHR: 89 %
Estimated workload: 10.1 METS
Exercise duration (sec): 15 s
Peak HR: 148 {beats}/min

## 2015-08-07 LAB — CBC
HCT: 40.4 % (ref 40.0–52.0)
Hemoglobin: 13.7 g/dL (ref 13.0–18.0)
MCH: 29.4 pg (ref 26.0–34.0)
MCHC: 34 g/dL (ref 32.0–36.0)
MCV: 86.4 fL (ref 80.0–100.0)
PLATELETS: 201 10*3/uL (ref 150–440)
RBC: 4.67 MIL/uL (ref 4.40–5.90)
RDW: 13.9 % (ref 11.5–14.5)
WBC: 6.6 10*3/uL (ref 3.8–10.6)

## 2015-08-07 LAB — BASIC METABOLIC PANEL
Anion gap: 5 (ref 5–15)
BUN: 18 mg/dL (ref 6–20)
CHLORIDE: 106 mmol/L (ref 101–111)
CO2: 26 mmol/L (ref 22–32)
CREATININE: 0.8 mg/dL (ref 0.61–1.24)
Calcium: 8.6 mg/dL — ABNORMAL LOW (ref 8.9–10.3)
Glucose, Bld: 87 mg/dL (ref 65–99)
Potassium: 3.8 mmol/L (ref 3.5–5.1)
SODIUM: 137 mmol/L (ref 135–145)

## 2015-08-07 LAB — GLUCOSE, CAPILLARY: GLUCOSE-CAPILLARY: 88 mg/dL (ref 65–99)

## 2015-08-07 LAB — TROPONIN I

## 2015-08-07 LAB — ECHOCARDIOGRAM COMPLETE
Height: 68 in
WEIGHTICAEL: 4704 [oz_av]

## 2015-08-07 MED ORDER — TECHNETIUM TC 99M TETROFOSMIN IV KIT: 30.0000 | PACK | Freq: Once | INTRAVENOUS | Status: DC | PRN

## 2015-08-07 MED ORDER — ACETAMINOPHEN 325 MG PO TABS
650.0000 mg | ORAL_TABLET | Freq: Four times a day (QID) | ORAL | Status: DC | PRN
  Administered 2015-08-07: 650 mg via ORAL
  Filled 2015-08-07: qty 2

## 2015-08-07 NOTE — Progress Notes (Signed)
Per Dr. Rockey Situ, stress test was negative and patient does not need to stay to have second part done tomorrow. Can be discharged from his perspective. Waiting on [place] LE results. Will update Dr. Vianne Bulls.

## 2015-08-07 NOTE — Progress Notes (Signed)
Patient complaining of headache - thought it might because he hasn't eaten anything since yesterday. CBG was checked (88). Spoke with Dr. Vianne Bulls - order for 650 tylenol PO.

## 2015-08-07 NOTE — Consult Note (Signed)
Cardiology Consultation Note  Patient ID: Sean Garza, MRN: [id number], DOB/AGE: 05/15/1960 55 y.o. Admit date: 08/06/2015   Date of Consult: 08/07/2015 Primary Physician: Albina Billet, MD Primary Cardiologist: New to [facility] Requesting Physician: Doylene Canning, MD  Chief Complaint: Chest pain Reason for Consult: Chest pain  HPI: 55 y.o. 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  No prior cardiac history. He reports his father had an MI around the patient's current age and his brother had an MI "just a little older" leading to cardiac bypass surgery. Patient was a prior smoker in his 24's, though reports smoking no more than 10 cigarettes total. He denies any etoh or illegal drugs. He does not use stairs 2/2 SOB. He has never had chest pain like the pain he has been experiencing on 5/24.   Patient recently started a new job, which has been stressful. He was at work on 5/24 when he developed sharp chest pain that lasted for a few minutes and self resolved. This was followed by the development of an ache along his substernal region. He reported feeling like something was sitting on his chest. Some dizziness, otherwise no associated symptoms.  Upon the patient's arrival to [facility] they were found to have negative troponin x 3, .SCr 0.95, K+ 4.1-->3.8, D-dimer 476 (must be greater than 499 to be elevated), unremarkable CBC, LDL 87, A1C pending. ECG non-acute as below, CXR negative. CTA chest without evidence of central PE, peripheral vessel were poorly seen. He is currently chest pain free. IM has already ordered nuclear stress test.     Past Medical History  Diagnosis Date  . Asthma   . Hx of blood clots   . Sleep apnea   . Collagen vascular disease ([place])     Rhematoid Arthritis      Most Recent Cardiac Studies: none   Surgical History:  Past Surgical History  Procedure Laterality Date  .  Achilles tendon repair       Home Meds: Prior to Admission medications   Medication Sig Start Date End Date Taking? Authorizing Provider  albuterol (PROVENTIL HFA;VENTOLIN HFA) 108 (90 Base) MCG/ACT inhaler Inhale 2 puffs into the lungs every 6 (six) hours as needed for wheezing or shortness of breath.    Yes Historical Provider, MD  budesonide-formoterol (SYMBICORT) 160-4.5 MCG/ACT inhaler Inhale 2 puffs into the lungs 2 (two) times daily.   Yes Historical Provider, MD    Inpatient Medications:  . aspirin EC  325 mg Oral Daily  . heparin  5,000 Units Subcutaneous Q8H  . mometasone-formoterol  2 puff Inhalation BID  . sodium chloride flush  3 mL Intravenous Q12H      Allergies:  Allergies  Allergen Reactions  . Penicillins Other (See Comments)    Reaction:  Unknown     Social History   Social History  . Marital Status: Single    Spouse Name: N/A  . Number of Children: N/A  . Years of Education: N/A   Occupational History  . Not on file.   Social History Main Topics  . Smoking status: Former Research scientist (life sciences)  . Smokeless tobacco: Not on file  . Alcohol Use: No  . Drug Use: No  . Sexual Activity: Not on file   Other Topics Concern  . Not on file   Social History Narrative     Family History  Problem Relation Age of  Onset  . CAD Father   . CAD Brother      Review of Systems: Review of Systems  Constitutional: Negative for fever, chills, weight loss, malaise/fatigue and diaphoresis.  HENT: Negative for congestion.   Eyes: Negative for discharge and redness.  Respiratory: Negative for cough, hemoptysis, sputum production, shortness of breath and wheezing.   Cardiovascular: Positive for chest pain. Negative for palpitations, orthopnea, claudication, leg swelling and PND.  Gastrointestinal: Negative for heartburn and nausea.  Musculoskeletal: Negative for myalgias and falls.  Neurological: Positive for dizziness. Negative for tingling, tremors, sensory change, speech  change, focal weakness, loss of consciousness and weakness.  Endo/Heme/Allergies: Does not bruise/bleed easily.  Psychiatric/Behavioral: Negative for substance abuse. The patient is nervous/anxious.   All other systems reviewed and are negative.   Labs:  Recent Labs  08/06/15 1318 08/06/15 1527 08/06/15 1904 08/07/15 0155  TROPONINI <0.03 <0.03 <0.03 <0.03   Lab Results  Component Value Date   WBC 6.6 08/07/2015   HGB 13.7 08/07/2015   HCT 40.4 08/07/2015   MCV 86.4 08/07/2015   PLT 201 08/07/2015    Recent Labs Lab 08/07/15 0155  NA 137  K 3.8  CL 106  CO2 26  BUN 18  CREATININE 0.80  CALCIUM 8.6*  GLUCOSE 87   Lab Results  Component Value Date   CHOL 158 08/06/2015   HDL 31* 08/06/2015   LDLCALC 87 08/06/2015   TRIG 200* 08/06/2015   No results found for: DDIMER  Radiology/Studies:  Dg Chest 2 View  08/06/2015  CLINICAL DATA:  Left side chest pain, dizziness starting 8 a.m. this morning EXAM: CHEST  2 VIEW COMPARISON:  06/07/2011 FINDINGS: Cardiomediastinal silhouette is stable. No acute infiltrate or pleural effusion. No pulmonary edema. Osteopenia and mild degenerative changes thoracic spine. IMPRESSION: No active cardiopulmonary disease. Electronically Signed   By: Lahoma Crocker M.D.   On: 08/06/2015 13:55   Ct Angio Chest Pe W/cm &/or Wo Cm  08/06/2015  CLINICAL DATA:  Chest pain, right lower extremity DVT 11 years ago. Not on anticoagulation. Substernal chest pain. EXAM: CT ANGIOGRAPHY CHEST WITH CONTRAST TECHNIQUE: Multidetector CT imaging of the chest was performed using the standard protocol during bolus administration of intravenous contrast. Multiplanar CT image reconstructions and MIPs were obtained to evaluate the vascular anatomy. CONTRAST:  100 mL Isovue 370 COMPARISON:  None. FINDINGS: Mediastinum/Lymph Nodes: There is adequate opacification of the central pulmonary arteries, but the segmental and subsegmental branches are suboptimally opacified. There  is no central pulmonary embolus. The main pulmonary artery, right main pulmonary artery and left main pulmonary arteries are normal in size. The heart size is normal. There is no pericardial effusion. Normal caliber thoracic aorta. No thoracic aortic dissection. Lungs/Pleura: No pulmonary mass, infiltrate, or effusion. Upper abdomen: Small hiatal hernia. No other focal upper abdominal abnormality. Musculoskeletal: No chest wall mass or suspicious bone lesions identified. Anterior bridging osteophytes throughout the lower thoracic spine as can be seen with diffuse idiopathic skeletal hyperostosis. Review of the MIP images confirms the above findings. IMPRESSION: 1. There is adequate opacification of the central pulmonary arteries, but the segmental and subsegmental branches are suboptimally opacified. There is no central pulmonary embolus. Electronically Signed   By: Kathreen Devoid   On: 08/06/2015 18:39    EKG: NSR, 77 bpm, no acute st/t changes  Weights: Filed Weights   08/06/15 1320  Weight: 294 lb (133.358 kg)     Physical Exam: Blood pressure 122/80, pulse 60, temperature 97.6 F (36.4  C), temperature source Oral, resp. rate 19, height 5\' 8"  (1.727 m), weight 294 lb (133.358 kg), SpO2 98 %. Body mass index is 44.71 kg/(m^2). General: Well developed, well nourished, in no acute distress. Head: Normocephalic, atraumatic, sclera non-icteric, no xanthomas, nares are without discharge.  Neck: Negative for carotid bruits. JVD not elevated. Lungs: Clear bilaterally to auscultation without wheezes, rales, or rhonchi. Breathing is unlabored. Heart: RRR with S1 S2. No murmurs, rubs, or gallops appreciated. Abdomen: Obese, soft, non-tender, non-distended with normoactive bowel sounds. No hepatomegaly. No rebound/guarding. No obvious abdominal masses. Msk:  Strength and tone appear normal for age. Extremities: No clubbing or cyanosis. No edema.  Distal pedal pulses are 2+ and equal bilaterally. Neuro:  Alert and oriented X 3. No facial asymmetry. No focal deficit. Moves all extremities spontaneously. Psych:  Responds to questions appropriately with a normal affect.    Assessment and Plan:  Principal Problem:   Chest pain    1. Chest pain with moderate risk of cardiac etiology: -Currently chest pain free and has been so since last evening -Troponin negative x 3, CTA chest negative for central PE, unable to visualize peripheral vessels -He is already scheduled for nuclear stress test by IM -Further recommendations pending results -A1C pending -Lipid with LDL of 87 -Aspirin   2. Family history of premature CAD: -As above  3. Asthma: -Well controlled -Per IM  4. Obesity: -Weight loss advised   5. History of LE DVT: -11 years ago -Treated with Coumadin, no longer on   Signed, Ryan Dunn, PA-C Pager: [phone] 08/07/2015, 8:37 AM

## 2015-08-07 NOTE — Discharge Summary (Signed)
Sean Garza, is a 55 y.o. male  DOB 1960-12-19  MRN [id number].  Admission date:  08/06/2015  Admitting Physician  Vaughan Basta, MD  Discharge Date:  08/07/2015   Primary MD  Albina Billet, MD  Recommendations for primary care physician for things to follow:    follow up  With primary doctor in 1 week Admission Diagnosis  Swelling [R60.9] Chest pain [R07.9] Chest pain, unspecified chest pain type [R07.9]   Discharge Diagnosis  Swelling [R60.9] Chest pain [R07.9] Chest pain, unspecified chest pain type [R07.9]    Principal Problem:   Chest pain Active Problems:   Swelling   History of DVT (deep vein thrombosis)   Family history of coronary artery disease   Obesity   Stress at work      Past Medical History  Diagnosis Date  . Asthma   . Hx of blood clots   . Sleep apnea   . Collagen vascular disease ([place])     Rhematoid Arthritis    Past Surgical History  Procedure Laterality Date  . Achilles tendon repair         History of present illness and  Hospital Course:     Kindly see H&P for history of present illness and admission details, please review complete Labs, Consult reports and Test reports for all details in brief  HPI  from the history and physical done on the day of admission  55 year old male patient admitted for chest pain. Has strong family history of premature coronary artery disease.  Hospital Course  #1 chest pain likely musculoskeletal: Troponins are negative for 3 times. Patient had a stress test this morning negative for ischemia. EF 62%. Discharge home in stable condition. #2. History of DVT: Patient ultrasound of the legs did not show any DVT, CT of the chest did not show any PE. 3.h/o copd;no wheezing.    Discharge Condition:   Follow UP  Follow-up Information     Follow up with Please make an appointment with your PCP.        Discharge Instructions  and  Discharge Medications       Medication List    TAKE these medications        albuterol 108 (90 Base) MCG/ACT inhaler  Commonly known as:  PROVENTIL HFA;VENTOLIN HFA  Inhale 2 puffs into the lungs every 6 (six) hours as needed for wheezing or shortness of breath.     budesonide-formoterol 160-4.5 MCG/ACT inhaler  Commonly known as:  SYMBICORT  Inhale 2 puffs into the lungs 2 (two) times daily.          Diet and Activity recommendation: See Discharge Instructions above   Consults obtained - cardiology   Major procedures and Radiology Reports - PLEASE review detailed and final reports for all details, in brief -     Dg Chest 2 View  08/06/2015  CLINICAL DATA:  Left side chest pain, dizziness starting 8 a.m. this morning EXAM: CHEST  2 VIEW COMPARISON:  06/07/2011 FINDINGS: Cardiomediastinal silhouette is stable. No acute infiltrate or pleural effusion. No pulmonary edema. Osteopenia and mild degenerative changes thoracic spine. IMPRESSION: No active cardiopulmonary disease. Electronically Signed   By: Lahoma Crocker M.D.   On: 08/06/2015 13:55   Ct Angio Chest Pe W/cm &/or Wo Cm  08/06/2015  CLINICAL DATA:  Chest pain, right lower extremity DVT 11 years ago. Not on anticoagulation. Substernal chest pain. EXAM: CT ANGIOGRAPHY CHEST WITH CONTRAST TECHNIQUE: Multidetector CT imaging of the chest was  performed using the standard protocol during bolus administration of intravenous contrast. Multiplanar CT image reconstructions and MIPs were obtained to evaluate the vascular anatomy. CONTRAST:  100 mL Isovue 370 COMPARISON:  None. FINDINGS: Mediastinum/Lymph Nodes: There is adequate opacification of the central pulmonary arteries, but the segmental and subsegmental branches are suboptimally opacified. There is no central pulmonary embolus. The main pulmonary artery, right main pulmonary artery  and left main pulmonary arteries are normal in size. The heart size is normal. There is no pericardial effusion. Normal caliber thoracic aorta. No thoracic aortic dissection. Lungs/Pleura: No pulmonary mass, infiltrate, or effusion. Upper abdomen: Small hiatal hernia. No other focal upper abdominal abnormality. Musculoskeletal: No chest wall mass or suspicious bone lesions identified. Anterior bridging osteophytes throughout the lower thoracic spine as can be seen with diffuse idiopathic skeletal hyperostosis. Review of the MIP images confirms the above findings. IMPRESSION: 1. There is adequate opacification of the central pulmonary arteries, but the segmental and subsegmental branches are suboptimally opacified. There is no central pulmonary embolus. Electronically Signed   By: Kathreen Devoid   On: 08/06/2015 18:39   Nm Myocar Multi W/spect W/wall Motion / Leory Plowman  08/07/2015  Exercise myocardial perfusion imaging study with no significant  ischemia Normal wall motion, EF estimated at 62% No EKG changes concerning for ischemia at peak stress or in recovery. Hypertensive with exercise Low risk scan Signed, Esmond Plants, MD, Ph.D [facility]   US Venous Img Lower Unilateral Right  08/07/2015  CLINICAL DATA:  Right lower extremity swelling. EXAM: RIGHT LOWER EXTREMITY VENOUS DOPPLER ULTRASOUND TECHNIQUE: Gray-scale sonography with graded compression, as well as color Doppler and duplex ultrasound were performed to evaluate the lower extremity deep venous systems from the level of the common femoral vein and including the common femoral, femoral, profunda femoral, popliteal and calf veins including the posterior tibial, peroneal and gastrocnemius veins when visible. The superficial great saphenous vein was also interrogated. Spectral Doppler was utilized to evaluate flow at rest and with distal augmentation maneuvers in the common femoral, femoral and popliteal veins. COMPARISON:  None. FINDINGS: Contralateral Common  Femoral Vein: Respiratory phasicity is normal and symmetric with the symptomatic side. No evidence of thrombus. Normal compressibility. Common Femoral Vein: No evidence of thrombus. Normal compressibility, respiratory phasicity and response to augmentation. Saphenofemoral Junction: No evidence of thrombus. Normal compressibility and flow on color Doppler imaging. Profunda Femoral Vein: No evidence of thrombus. Normal compressibility and flow on color Doppler imaging. Femoral Vein: No evidence of thrombus. Normal compressibility, respiratory phasicity and response to augmentation. Popliteal Vein: No evidence of thrombus. Normal compressibility, respiratory phasicity and response to augmentation. Calf Veins: No evidence of thrombus. Normal compressibility and flow on color Doppler imaging. Superficial Great Saphenous Vein: No evidence of thrombus. Normal compressibility and flow on color Doppler imaging. Other Findings:  None. IMPRESSION: No evidence of deep venous thrombosis. Electronically Signed   By: Marcello Moores  Register   On: 08/07/2015 12:06    Micro Results    No results found for this or any previous visit (from the past 240 hour(s)).     Today   Subjective:   Sean Garza today has no headache,no chest abdominal pain,no new weakness tingling or numbness, feels much better wants to go home today.  Objective:   Blood pressure 100/59, pulse 68, temperature 97.5 F (36.4 C), temperature source Oral, resp. rate 18, height 5\' 8"  (1.727 m), weight 133.358 kg (294 lb), SpO2 95 %.   Intake/Output Summary (Last 24 hours) at 08/07/15  [place] filed at 08/07/15 1000  Gross per 24 hour  Intake      0 ml  Output      0 ml  Net      0 ml    Exam Awake Alert, Oriented x 3, No new F.N deficits, Normal affect [place].AT,PERRAL Supple Neck,No JVD, No cervical lymphadenopathy appriciated.  Symmetrical Chest wall movement, Good air movement bilaterally, CTAB RRR,No Gallops,Rubs or new Murmurs, No  Parasternal Heave +ve B.Sounds, Abd Soft, Non tender, No organomegaly appriciated, No rebound -guarding or rigidity. No Cyanosis, Clubbing or edema, No new Rash or bruise  Data Review   CBC w Diff: Lab Results  Component Value Date   WBC 6.6 08/07/2015   WBC 6.9 05/24/2011   HGB 13.7 08/07/2015   HGB 13.8 05/24/2011   HCT 40.4 08/07/2015   HCT 40.0 05/24/2011   PLT 201 08/07/2015   PLT 249 05/24/2011    CMP: Lab Results  Component Value Date   NA 137 08/07/2015   NA 142 05/24/2011   K 3.8 08/07/2015   K 4.1 05/24/2011   CL 106 08/07/2015   CL 103 05/24/2011   CO2 26 08/07/2015   CO2 25 05/24/2011   BUN 18 08/07/2015   BUN 16 05/24/2011   CREATININE 0.80 08/07/2015   CREATININE 0.83 05/24/2011  .   Total Time in preparing paper work, data evaluation and todays exam - 47 minutes  Kandee Escalante M.D on 08/07/2015 at 2:06 PM    Note: This dictation was prepared with Dragon dictation along with smaller phrase technology. Any transcriptional errors that result from this process are unintentional.

## 2015-08-07 NOTE — Progress Notes (Signed)
Patient given discharge teaching and paperwork regarding medications, diet, follow-up appointments and activity. Patient understanding verbalized. No complaints at this time. IV and telemetry discontinued prior to leaving. Skin assessment as previously charted and vitals are stable; on room air. Patient being discharged to home. Family present during discharge teaching.

## 2015-08-08 ENCOUNTER — Other Ambulatory Visit: Payer: BLUE CROSS/BLUE SHIELD

## 2015-08-08 LAB — HEMOGLOBIN A1C: HEMOGLOBIN A1C: 5.4 % (ref 4.0–6.0)

## 2015-08-28 ENCOUNTER — Encounter: Payer: Self-pay | Admitting: *Deleted

## 2015-08-28 ENCOUNTER — Ambulatory Visit
Admission: EM | Admit: 2015-08-28 | Discharge: 2015-08-28 | Disposition: A | Discharge status: Home / Self Care | Payer: BLUE CROSS/BLUE SHIELD | Attending: Family Medicine | Admitting: Family Medicine

## 2015-08-28 DIAGNOSIS — H109 Unspecified conjunctivitis: Secondary | ICD-10-CM

## 2015-08-28 DIAGNOSIS — H02533 Eyelid retraction right eye, unspecified eyelid: Secondary | ICD-10-CM | POA: Diagnosis not present

## 2015-08-28 MED ORDER — MOXIFLOXACIN HCL 0.5 % OP SOLN: 1.0000 [drp] | Freq: Three times a day (TID) | OPHTHALMIC | Status: DC

## 2015-08-28 NOTE — ED Notes (Signed)
Right eye redness and edema x3 days. Pt awakens in the morning with yellow matting to right eye.

## 2015-08-28 NOTE — ED Provider Notes (Signed)
CSN: [id number]     Arrival date & time 08/28/15  1114 History   First MD Initiated Contact with Patient 08/28/15 1203     Chief Complaint  Patient presents with  . Eye Drainage  . Facial Swelling   (Consider location/radiation/quality/duration/timing/severity/associated sxs/prior Treatment) HPI  This a 55 year old male presents with right eye redness and edema and matting for about a week but has worsened over the last 3 days. He states when he awakens in the morning he has a yellow matting to his right eye. Is recently had his prescription redone for his eyes. His Acuity is 20/50 in the right eye 20/20 on the left eye and 20/20 both eyes. He does not wear contacts. He has no light sensitivity. He has no foreign body sensation. He does have a lid lag noticeable on the right. He denies any other neurological symptoms. He does state that his vision on his right eye is blurred. Denies any itching or burning.      Past Medical History  Diagnosis Date  . Asthma   . Hx of blood clots   . Sleep apnea   . Collagen vascular disease ([place])     Rhematoid Arthritis   Past Surgical History  Procedure Laterality Date  . Achilles tendon repair     Family History  Problem Relation Age of Onset  . CAD Father   . CAD Brother    Social History  Substance Use Topics  . Smoking status: Former Research scientist (life sciences)  . Smokeless tobacco: None  . Alcohol Use: No    Review of Systems  Constitutional: Positive for activity change. Negative for fever, chills and fatigue.  Eyes: Positive for discharge, redness and visual disturbance. Negative for photophobia and pain.  All other systems reviewed and are negative.   Allergies  Penicillins  Home Medications   Prior to Admission medications   Medication Sig Start Date End Date Taking? Authorizing Provider  budesonide-formoterol (SYMBICORT) 160-4.5 MCG/ACT inhaler Inhale 2 puffs into the lungs 2 (two) times daily.   Yes Historical Provider, MD  albuterol  (PROVENTIL HFA;VENTOLIN HFA) 108 (90 Base) MCG/ACT inhaler Inhale 2 puffs into the lungs every 6 (six) hours as needed for wheezing or shortness of breath.     Historical Provider, MD  moxifloxacin (VIGAMOX) 0.5 % ophthalmic solution Place 1 drop into the right eye 3 (three) times daily. 08/28/15   Lorin Picket, PA-C   Meds Ordered and Administered this Visit  Medications - No data to display  BP 134/82 mmHg  Pulse 71  Temp(Src) 98 F (36.7 C) (Oral)  Resp 16  Ht 5\' 8"  (1.727 m)  Wt 285 lb (129.275 kg)  BMI 43.34 kg/m2  SpO2 95% No data found.   Physical Exam  Constitutional: He is oriented to person, place, and time. He appears well-developed and well-nourished. No distress.  HENT:  Head: Normocephalic and atraumatic.  Right Ear: External ear normal.  Left Ear: External ear normal.  Eyes: EOM are normal. Pupils are equal, round, and reactive to light. Right eye exhibits discharge. Left eye exhibits no discharge. No scleral icterus.  Exam nation of the right eye shows [place] a small amount of conjunctivitis with residual matting over the lateral eyelashes and lateral canthus. He does have noticeable lid lag in the right eye. EOMs are intact and full. PERRLA. No foreign body is seen under the upper lids. There does not appear to be any photosensitivity. Woods lamp was not available to [place] today and we  do not have slit lamp  Neck: Normal range of motion. Neck supple.  Musculoskeletal: Normal range of motion. He exhibits edema and tenderness.  Lymphadenopathy:    He has no cervical adenopathy.  Neurological: He is alert and oriented to person, place, and time.  Skin: Skin is warm and dry. He is not diaphoretic.  Psychiatric: He has a normal mood and affect. His behavior is normal. Judgment and thought content normal.  Nursing note and vitals reviewed.   ED Course  Procedures (including critical care time)  Labs Review Labs Reviewed - No data to display  Imaging Review No results  found.   Visual Acuity Review  Right Eye Distance: 20/50 Left Eye Distance: 20/20 Bilateral Distance: 20/20  Right Eye Near:   Left Eye Near:    Bilateral Near:         MDM   1. Conjunctivitis of right eye   2. Lid retraction or lag, right    Discharge Medication List as of 08/28/2015 12:28 PM    START taking these medications   Details  moxifloxacin (VIGAMOX) 0.5 % ophthalmic solution Place 1 drop into the right eye 3 (three) times daily., Starting 08/28/2015, Until Discontinued, Print       Discharge Medication List as of 08/28/2015 12:28 PM    START taking these medications   Details  moxifloxacin (VIGAMOX) 0.5 % ophthalmic solution Place 1 drop into the right eye 3 (three) times daily., Starting 08/28/2015, Until Discontinued, Print      Plan: 1. Test/x-ray results and diagnosis reviewed with patient 2. rx as per orders; risks, benefits, potential side effects reviewed with patient 3. Recommend supportive treatment with Compresses as needed. I recommended that he be seen at [facility] eye for further evaluation and necessary treatment for the lid lag and conjunctivitis. The information was provided to the patient he will make the phone call appointment in 2 days. Treat him empirically with Vigamox. This is explained to him in detail 4. F/u prn if symptoms worsen or don't improve     Lorin Picket, PA-C 08/28/15 1248

## 2015-08-28 NOTE — Discharge Instructions (Signed)
Bacterial Conjunctivitis °Bacterial conjunctivitis, commonly called pink eye, is an inflammation of the clear membrane that covers the white part of the eye (conjunctiva). The inflammation can also happen on the underside of the eyelids. The blood vessels in the conjunctiva become inflamed, causing the eye to become red or pink. Bacterial conjunctivitis may spread easily from one eye to another and from person to person (contagious).  °CAUSES  °Bacterial conjunctivitis is caused by bacteria. The bacteria may come from your own skin, your upper respiratory tract, or from someone else with bacterial conjunctivitis. °SYMPTOMS  °The normally white color of the eye or the underside of the eyelid is usually pink or red. The pink eye is usually associated with irritation, tearing, and some sensitivity to light. Bacterial conjunctivitis is often associated with a thick, yellowish discharge from the eye. The discharge may turn into a crust on the eyelids overnight, which causes your eyelids to stick together. If a discharge is present, there may also be some blurred vision in the affected eye. °DIAGNOSIS  °Bacterial conjunctivitis is diagnosed by your caregiver through an eye exam and the symptoms that you report. Your caregiver looks for changes in the surface tissues of your eyes, which may point to the specific type of conjunctivitis. A sample of any discharge may be collected on a cotton-tip swab if you have a severe case of conjunctivitis, if your cornea is affected, or if you keep getting repeat infections that do not respond to treatment. The sample will be sent to a lab to see if the inflammation is caused by a bacterial infection and to see if the infection will respond to antibiotic medicines. °TREATMENT  °1. Bacterial conjunctivitis is treated with antibiotics. Antibiotic eyedrops are most often used. However, antibiotic ointments are also available. Antibiotics pills are sometimes used. Artificial tears or eye  washes may ease discomfort. °HOME CARE INSTRUCTIONS  °1. To ease discomfort, apply a cool, clean washcloth to your eye for 10-20 minutes, 3-4 times a day. °2. Gently wipe away any drainage from your eye with a warm, wet washcloth or a cotton ball. °3. Wash your hands often with soap and water. Use paper towels to dry your hands. °4. Do not share towels or washcloths. This may spread the infection. °5. Change or wash your pillowcase every day. °6. You should not use eye makeup until the infection is gone. °7. Do not operate machinery or drive if your vision is blurred. °8. Stop using contact lenses. Ask your caregiver how to sterilize or replace your contacts before using them again. This depends on the type of contact lenses that you use. °9. When applying medicine to the infected eye, do not touch the edge of your eyelid with the eyedrop bottle or ointment tube. °SEEK IMMEDIATE MEDICAL CARE IF:  °· Your infection has not improved within 3 days after beginning treatment. °· You had yellow discharge from your eye and it returns. °· You have increased eye pain. °· Your eye redness is spreading. °· Your vision becomes blurred. °· You have a fever or persistent symptoms for more than 2-3 days. °· You have a fever and your symptoms suddenly get worse. °· You have facial pain, redness, or swelling. °MAKE SURE YOU:  °· Understand these instructions. °· Will watch your condition. °· Will get help right away if you are not doing well or get worse. °  °This information is not intended to replace advice given to you by your health care provider. Make sure you   discuss any questions you have with your health care provider. °  °Document Released: 03/01/2005 Document Revised: 03/22/2014 Document Reviewed: 08/02/2011 °Elsevier Interactive Patient Education ©2016 [employer] ° °How to Use Eye Drops and Eye Ointments °HOW TO APPLY EYE DROPS °Follow these steps when applying eye drops: °2. Wash your hands. °3. Tilt your head  back. °4. Put a finger under your eye and use it to gently pull your lower lid downward. Keep that finger in place. °5. Using your other hand, hold the dropper between your thumb and index finger. °6. Position the dropper just over the edge of the lower lid. Hold it as close to your eye as you can without touching the dropper to your eye. °7. Steady your hand. One way to do this is to lean your index finger against your brow. °8. Look up. °9. Slowly and gently squeeze one drop of medicine into your eye. °10. Close your eye. °11. Place a finger between your lower eyelid and your nose. Press gently for 2 minutes. This increases the amount of time that the medicine is exposed to the eye. It also reduces side effects that can develop if the drop gets into the bloodstream through the nose. °HOW TO APPLY EYE OINTMENTS °Follow these steps when applying eye ointments: °10. Wash your hands. °11. Put a finger under your eye and use it to gently pull your lower lid downward. Keep that finger in place. °12. Using your other hand, place the tip of the tube between your thumb and index finger with the remaining fingers braced against your cheek or nose. °13. Hold the tube just over the edge of your lower lid without touching the tube to your lid or eyeball. °14. Look up. °15. Line the inner part of your lower lid with ointment. °16. Gently pull up on your upper lid and look down. This will force the ointment to spread over the surface of the eye. °17. Release the upper lid. °18. If you can, close your eyes for 1-2 minutes. °Do not rub your eyes. If you applied the ointment correctly, your vision will be blurry for a few minutes. This is normal. °ADDITIONAL INFORMATION °· Make sure to use the eye drops or ointment as told by your health care provider. °· If you have been told to use both eye drops and an eye ointment, apply the eye drops first, then wait 3-4 minutes before you apply the ointment. °· Try not to touch the tip of the  dropper or tube to your eye. A dropper or tube that has touched the eye can become contaminated. °  °This information is not intended to replace advice given to you by your health care provider. Make sure you discuss any questions you have with your health care provider. °  °Document Released: 06/07/2000 Document Revised: 07/16/2014 Document Reviewed: 02/25/2014 °Elsevier Interactive Patient Education ©2016 Elsevier Inc. ° °

## 2016-12-10 ENCOUNTER — Encounter: Payer: Self-pay | Admitting: *Deleted

## 2016-12-13 ENCOUNTER — Ambulatory Visit: Payer: BLUE CROSS/BLUE SHIELD | Admitting: Anesthesiology

## 2016-12-13 ENCOUNTER — Encounter
Admission: RE | Disposition: A | Discharge status: Home / Self Care | Payer: Self-pay | Source: Ambulatory Visit | Attending: Unknown Physician Specialty

## 2016-12-13 ENCOUNTER — Ambulatory Visit
Admission: RE | Admit: 2016-12-13 | Discharge: 2016-12-13 | Disposition: A | Discharge status: Home / Self Care | Payer: BLUE CROSS/BLUE SHIELD | Source: Ambulatory Visit | Attending: Unknown Physician Specialty | Admitting: Unknown Physician Specialty

## 2016-12-13 ENCOUNTER — Encounter: Payer: Self-pay | Admitting: *Deleted

## 2016-12-13 DIAGNOSIS — Z1211 Encounter for screening for malignant neoplasm of colon: Secondary | ICD-10-CM | POA: Diagnosis not present

## 2016-12-13 DIAGNOSIS — Z9889 Other specified postprocedural states: Secondary | ICD-10-CM | POA: Insufficient documentation

## 2016-12-13 DIAGNOSIS — D123 Benign neoplasm of transverse colon: Secondary | ICD-10-CM | POA: Insufficient documentation

## 2016-12-13 DIAGNOSIS — J45909 Unspecified asthma, uncomplicated: Secondary | ICD-10-CM | POA: Insufficient documentation

## 2016-12-13 DIAGNOSIS — Z86718 Personal history of other venous thrombosis and embolism: Secondary | ICD-10-CM | POA: Insufficient documentation

## 2016-12-13 DIAGNOSIS — Z79899 Other long term (current) drug therapy: Secondary | ICD-10-CM | POA: Diagnosis not present

## 2016-12-13 DIAGNOSIS — G4730 Sleep apnea, unspecified: Secondary | ICD-10-CM | POA: Insufficient documentation

## 2016-12-13 DIAGNOSIS — M069 Rheumatoid arthritis, unspecified: Secondary | ICD-10-CM | POA: Insufficient documentation

## 2016-12-13 DIAGNOSIS — K621 Rectal polyp: Secondary | ICD-10-CM | POA: Insufficient documentation

## 2016-12-13 DIAGNOSIS — I10 Essential (primary) hypertension: Secondary | ICD-10-CM | POA: Insufficient documentation

## 2016-12-13 DIAGNOSIS — K640 First degree hemorrhoids: Secondary | ICD-10-CM | POA: Diagnosis not present

## 2016-12-13 DIAGNOSIS — E669 Obesity, unspecified: Secondary | ICD-10-CM | POA: Insufficient documentation

## 2016-12-13 DIAGNOSIS — Z87891 Personal history of nicotine dependence: Secondary | ICD-10-CM | POA: Diagnosis not present

## 2016-12-13 DIAGNOSIS — Z6841 Body Mass Index (BMI) 40.0 and over, adult: Secondary | ICD-10-CM | POA: Insufficient documentation

## 2016-12-13 DIAGNOSIS — M359 Systemic involvement of connective tissue, unspecified: Secondary | ICD-10-CM | POA: Diagnosis not present

## 2016-12-13 DIAGNOSIS — Z8249 Family history of ischemic heart disease and other diseases of the circulatory system: Secondary | ICD-10-CM | POA: Insufficient documentation

## 2016-12-13 DIAGNOSIS — K219 Gastro-esophageal reflux disease without esophagitis: Secondary | ICD-10-CM | POA: Insufficient documentation

## 2016-12-13 DIAGNOSIS — Z880 Allergy status to penicillin: Secondary | ICD-10-CM | POA: Insufficient documentation

## 2016-12-13 DIAGNOSIS — Z7951 Long term (current) use of inhaled steroids: Secondary | ICD-10-CM | POA: Insufficient documentation

## 2016-12-13 HISTORY — DX: Gout, unspecified: M10.9

## 2016-12-13 HISTORY — DX: Essential (primary) hypertension: I10

## 2016-12-13 HISTORY — DX: Gastro-esophageal reflux disease without esophagitis: K21.9

## 2016-12-13 HISTORY — PX: COLONOSCOPY WITH PROPOFOL: SHX5780

## 2016-12-13 SURGERY — COLONOSCOPY WITH PROPOFOL
Anesthesia: General

## 2016-12-13 MED ORDER — PROPOFOL 500 MG/50ML IV EMUL
INTRAVENOUS | Status: DC | PRN
  Administered 2016-12-13: 120 ug/kg/min via INTRAVENOUS

## 2016-12-13 MED ORDER — SODIUM CHLORIDE 0.9 % IV SOLN: INTRAVENOUS | Status: DC

## 2016-12-13 MED ORDER — PROPOFOL 10 MG/ML IV BOLUS
INTRAVENOUS | Status: AC
  Filled 2016-12-13: qty 20

## 2016-12-13 MED ORDER — PROPOFOL 10 MG/ML IV BOLUS
INTRAVENOUS | Status: DC | PRN
  Administered 2016-12-13: 90 mg via INTRAVENOUS

## 2016-12-13 MED ORDER — SODIUM CHLORIDE 0.9 % IV SOLN
INTRAVENOUS | Status: DC
  Administered 2016-12-13: 15:00:00 via INTRAVENOUS

## 2016-12-13 MED ORDER — FENTANYL CITRATE (PF) 100 MCG/2ML IJ SOLN
INTRAMUSCULAR | Status: AC
  Filled 2016-12-13: qty 2

## 2016-12-13 MED ORDER — PROPOFOL 500 MG/50ML IV EMUL
INTRAVENOUS | Status: AC
  Filled 2016-12-13: qty 50

## 2016-12-13 NOTE — Transfer of Care (Signed)
Immediate Anesthesia Transfer of Care Note  Patient: Sean Garza  Procedure(s) Performed: COLONOSCOPY WITH PROPOFOL (N/A )  Patient Location: PACU and Endoscopy Unit  Anesthesia Type:General  Level of Consciousness: drowsy and patient cooperative  Airway & Oxygen Therapy: Patient Spontanous Breathing and Patient connected to nasal cannula oxygen  Post-op Assessment: Report given to RN and Post -op Vital signs reviewed and stable  Post vital signs: Reviewed and stable  Last Vitals:  Vitals:   12/13/16 1238 12/13/16 1545  BP: (!) 149/95 123/61  Pulse: 75 68  Resp: 16 18  Temp: 37.1 C   SpO2: 99% 97%    Last Pain:  Vitals:   12/13/16 1238  TempSrc: Tympanic  PainSc: 3          Complications: No apparent anesthesia complications

## 2016-12-13 NOTE — Op Note (Signed)
[facility] Gastroenterology Patient Name: Sean Garza Procedure Date: 12/13/2016 3:02 PM MRN: [id number] Account #: [id number] Date of Birth: [birth year] Admit Type: Outpatient Age: 56 Room: [facility] ENDO ROOM 3 Gender: Male Note Status: Finalized Procedure:            Colonoscopy Indications:          Screening for colorectal malignant neoplasm Providers:            Manya Silvas, MD Referring MD:         Leona Carry. Hall Busing, MD (Referring MD) Medicines:            Propofol per Anesthesia Complications:        No immediate complications. Procedure:            Pre-Anesthesia Assessment:                       - After reviewing the risks and benefits, the patient                        was deemed in satisfactory condition to undergo the                        procedure.                       After obtaining informed consent, the colonoscope was                        passed under direct vision. Throughout the procedure,                        the patient's blood pressure, pulse, and oxygen                        saturations were monitored continuously. The                        Colonoscope was introduced through the anus and                        advanced to the the cecum, identified by appendiceal                        orifice and ileocecal valve. The colonoscopy was                        somewhat difficult. The patient tolerated the procedure                        well. The quality of the bowel preparation was adequate                        to identify polyps. Findings:      A diminutive polyp was found in the transverse colon. The polyp was       sessile. The polyp was removed with a jumbo cold forceps. Resection and       retrieval were complete.      A diminutive polyp was found in the rectum. The polyp was sessile. The       polyp was removed with a jumbo cold forceps. Resection and retrieval  were complete.      The exam was otherwise without  abnormality.      Internal hemorrhoids were found during endoscopy. The hemorrhoids were       small and Grade I (internal hemorrhoids that do not prolapse). Impression:           - One diminutive polyp in the transverse colon, removed                        with a jumbo cold forceps. Resected and retrieved.                       - One diminutive polyp in the rectum, removed with a                        jumbo cold forceps. Resected and retrieved.                       - The examination was otherwise normal.                       - Internal hemorrhoids. Recommendation:       - Await pathology results. Manya Silvas, MD 12/13/2016 3:40:22 PM This report has been signed electronically. Number of Addenda: 0 Note Initiated On: 12/13/2016 3:02 PM Scope Withdrawal Time: 0 hours 21 minutes 31 seconds  Total Procedure Duration: 0 hours 28 minutes 16 seconds       [facility]

## 2016-12-13 NOTE — H&P (Signed)
   Primary Care Physician:  Albina Billet, MD Primary Gastroenterologist:  Dr. Vira Agar  Pre-Procedure History & Physical: HPI:  Sean Garza is a 56 y.o. male is here for an colonoscopy.   Past Medical History:  Diagnosis Date  . Asthma   . Collagen vascular disease (H[facility])    Rhematoid Arthritis  . GERD (gastroesophageal reflux disease)   . Gout   . Hx of blood clots   . Hypertension   . Sleep apnea     Past Surgical History:  Procedure Laterality Date  . ACHILLES TENDON REPAIR    . EYE SURGERY     floppy eye syndrome surgery    Prior to Admission medications   Medication Sig Start Date End Date Taking? Authorizing Provider  albuterol (PROVENTIL HFA;VENTOLIN HFA) 108 (90 Base) MCG/ACT inhaler Inhale 2 puffs into the lungs every 6 (six) hours as needed for wheezing or shortness of breath.    Yes [provider]  budesonide-formoterol (SYMBICORT) 160-4.5 MCG/ACT inhaler Inhale 2 puffs into the lungs 2 (two) times daily.   Yes [provider]  sildenafil (REVATIO) 20 MG tablet Take 20 mg by mouth daily. As needed   Yes [provider]  moxifloxacin (VIGAMOX) 0.5 % ophthalmic solution Place 1 drop into the right eye 3 (three) times daily. 08/28/15   Lorin Picket, PA-C    Allergies as of 09/17/2016 - Review Complete 08/28/2015  Allergen Reaction Noted  . Penicillins Other (See Comments) 08/06/2015    Family History  Problem Relation Age of Onset  . CAD Father   . CAD Brother     Social History   Social History  . Marital status: Single    Spouse name: N/A  . Number of children: N/A  . Years of education: N/A   Occupational History  . Not on file.   Social History Main Topics  . Smoking status: Former Research scientist (life sciences)  . Smokeless tobacco: Never Used  . Alcohol use No  . Drug use: No  . Sexual activity: Not on file   Other Topics Concern  . Not on file   Social History Narrative  . No narrative on file    Review of Systems: See  HPI, otherwise negative ROS  Physical Exam: BP (!) 149/95   Pulse 75   Temp 98.8 F (37.1 C) (Tympanic)   Resp 16   Ht 5\' 8"  (1.727 m)   Wt 133.8 kg (295 lb)   SpO2 99%   BMI 44.85 kg/m  General:   Alert,  pleasant and cooperative in NAD,patient significantly obese. Head:  Normocephalic and atraumatic. Neck:  Supple; no masses or thyromegaly. Lungs:  Clear throughout to auscultation.    Heart:  Regular rate and rhythm. Abdomen:  Soft, nontender and nondistended. Normal bowel sounds, without guarding, and without rebound.   Neurologic:  Alert and  oriented x4;  grossly normal neurologically.  Impression/Plan: Sean Garza is here for an colonoscopy to be performed for colon screening.  Risks, benefits, limitations, and alternatives regarding  colonoscopy have been reviewed with the patient.  Questions have been answered.  All parties agreeable.   Gaylyn Cheers, MD  12/13/2016, 3:00 PM

## 2016-12-13 NOTE — Anesthesia Post-op Follow-up Note (Signed)
Anesthesia QCDR form completed.        

## 2016-12-13 NOTE — Anesthesia Preprocedure Evaluation (Signed)
Anesthesia Evaluation  Patient identified by MRN, date of birth, ID band Patient awake    Reviewed: Allergy & Precautions, NPO status , Patient's Chart, lab work & pertinent test results  History of Anesthesia Complications Negative for: history of anesthetic complications  Airway Mallampati: I  TM Distance: >3 FB Neck ROM: Full    Dental no notable dental hx.    Pulmonary asthma , sleep apnea (s/p corrective surgery) , former smoker,    breath sounds clear to auscultation- rhonchi (-) wheezing      Cardiovascular hypertension, Pt. on medications (-) CAD, (-) Past MI and (-) Cardiac Stents  Rhythm:Regular Rate:Normal - Systolic murmurs and - Diastolic murmurs    Neuro/Psych negative neurological ROS  negative psych ROS   GI/Hepatic Neg liver ROS, GERD  ,  Endo/Other  negative endocrine ROSneg diabetes  Renal/GU negative Renal ROS     Musculoskeletal negative musculoskeletal ROS (+)   Abdominal (+) + obese,   Peds  Hematology negative hematology ROS (+)   Anesthesia Other Findings Past Medical History: No date: Asthma No date: Collagen vascular disease ([place])     Comment:  Rhematoid Arthritis No date: GERD (gastroesophageal reflux disease) No date: Gout No date: Hx of blood clots No date: Hypertension No date: Sleep apnea   Reproductive/Obstetrics                             Anesthesia Physical Anesthesia Plan  ASA: II  Anesthesia Plan: General   Post-op Pain Management:    Induction: Intravenous  PONV Risk Score and Plan: 1 and Propofol infusion  Airway Management Planned: Natural Airway  Additional Equipment:   Intra-op Plan:   Post-operative Plan:   Informed Consent: I have reviewed the patients History and Physical, chart, labs and discussed the procedure including the risks, benefits and alternatives for the proposed anesthesia with the patient or authorized  representative who has indicated his/her understanding and acceptance.   Dental advisory given  Plan Discussed with: CRNA and Anesthesiologist  Anesthesia Plan Comments:         Anesthesia Quick Evaluation

## 2016-12-13 NOTE — Anesthesia Postprocedure Evaluation (Signed)
Anesthesia Post Note  Patient: Sean Garza  Procedure(s) Performed: COLONOSCOPY WITH PROPOFOL (N/A )  Patient location during evaluation: PACU Anesthesia Type: General Level of consciousness: awake and alert and oriented Pain management: pain level controlled Vital Signs Assessment: post-procedure vital signs reviewed and stable Respiratory status: spontaneous breathing Cardiovascular status: blood pressure returned to baseline Anesthetic complications: no     Last Vitals:  Vitals:   12/13/16 1238 12/13/16 1545  BP: (!) 149/95 123/61  Pulse: 75 68  Resp: 16 18  Temp: 37.1 C 36.6 C  SpO2: 99% 97%    Last Pain:  Vitals:   12/13/16 1545  TempSrc: Tympanic  PainSc:                  Karron Alvizo

## 2016-12-14 ENCOUNTER — Encounter: Payer: Self-pay | Admitting: Unknown Physician Specialty

## 2016-12-15 LAB — SURGICAL PATHOLOGY

## 2017-02-16 ENCOUNTER — Other Ambulatory Visit: Payer: Self-pay | Admitting: Student

## 2017-02-16 DIAGNOSIS — M7582 Other shoulder lesions, left shoulder: Secondary | ICD-10-CM

## 2017-02-24 ENCOUNTER — Ambulatory Visit
Admission: RE | Admit: 2017-02-24 | Discharge: 2017-02-24 | Disposition: A | Discharge status: Home / Self Care | Payer: BLUE CROSS/BLUE SHIELD | Source: Ambulatory Visit | Attending: Student | Admitting: Student

## 2017-02-24 DIAGNOSIS — M19012 Primary osteoarthritis, left shoulder: Secondary | ICD-10-CM | POA: Diagnosis not present

## 2017-02-24 DIAGNOSIS — M7582 Other shoulder lesions, left shoulder: Secondary | ICD-10-CM | POA: Diagnosis present

## 2017-02-28 DIAGNOSIS — M7582 Other shoulder lesions, left shoulder: Secondary | ICD-10-CM | POA: Insufficient documentation

## 2017-02-28 DIAGNOSIS — M75122 Complete rotator cuff tear or rupture of left shoulder, not specified as traumatic: Secondary | ICD-10-CM | POA: Insufficient documentation

## 2017-03-17 ENCOUNTER — Inpatient Hospital Stay: Admission: RE | Admit: 2017-03-17 | Payer: BLUE CROSS/BLUE SHIELD | Source: Ambulatory Visit

## 2017-03-21 ENCOUNTER — Encounter
Admission: RE | Admit: 2017-03-21 | Discharge: 2017-03-21 | Disposition: A | Discharge status: Home / Self Care | Payer: BLUE CROSS/BLUE SHIELD | Source: Ambulatory Visit | Attending: Surgery | Admitting: Surgery

## 2017-03-21 NOTE — Patient Instructions (Signed)
Your procedure is scheduled on:03/24/17 Report to Day Surgery. [facility] SECOND FLOOR To find out your arrival time please call [phone] between 1PM - 3PM on1/9/19.  Remember: Instructions that are not followed completely may result in serious medical risk, up to and including death, or upon the discretion of your surgeon and anesthesiologist your surgery may need to be rescheduled.     _X__ 1. Do not eat food after midnight the night before your procedure.                 No gum chewing or hard candies. You may drink clear liquids up to 2 hours                 before you are scheduled to arrive for your surgery- DO not drink clear                 liquids within 2 hours of the start of your surgery.                 Clear Liquids include:  water, apple juice without pulp, clear carbohydrate                 drink such as Clearfast of [place], Black Coffee or Tea (Do not add                 anything to coffee or tea).     _X__ 2.  No Alcohol for 24 hours before or after surgery.   _X__ 3.  Do Not Smoke or use e-cigarettes For 24 Hours Prior to Your Surgery.                 Do not use any chewable tobacco products for at least 6 hours prior to                 surgery.  ____  4.  Bring all medications with you on the day of surgery if instructed.   _X___  5.  Notify your doctor if there is any change in your medical condition      (cold, fever, infections).     Do not wear jewelry, make-up, hairpins, clips or nail polish. Do not wear lotions, powders, or perfumes. You may wear deodorant. Do not shave 48 hours prior to surgery. Men may shave face and neck. Do not bring valuables to the hospital.    [facility] is not responsible for any belongings or valuables.  Contacts, dentures or bridgework may not be worn into surgery. Leave your suitcase in the car. After surgery it may be brought to your room. For patients admitted to the hospital, discharge time is  determined by your treatment team.   Patients discharged the day of surgery will not be allowed to drive home.   Please read over the following fact sheets that you were given:   Surgical Site Infection Prevention   ____ Take these medicines the morning of surgery with A SIP OF WATER:    1. NONE  2.   3.   4.  5.  6.  ____ Fleet Enema (as directed)   _X_ Use CHG Soap as directed  _X___ Use inhalers on the day of surgery AND BRING  ____ Stop metformin 2 days prior to surgery    ____ Take 1/2 of usual insulin dose the night before surgery. No insulin the morning          of surgery.   ____ Stop  Coumadin/Plavix/aspirin on  ____ Stop Anti-inflammatories on    ____ Stop supplements until after surgery.    ____ Bring C-Pap to the hospital.

## 2017-03-23 ENCOUNTER — Encounter
Admission: RE | Admit: 2017-03-23 | Discharge: 2017-03-23 | Disposition: A | Discharge status: Home / Self Care | Payer: BLUE CROSS/BLUE SHIELD | Source: Ambulatory Visit | Attending: Surgery | Admitting: Surgery

## 2017-03-23 DIAGNOSIS — J45909 Unspecified asthma, uncomplicated: Secondary | ICD-10-CM | POA: Diagnosis not present

## 2017-03-23 DIAGNOSIS — M7502 Adhesive capsulitis of left shoulder: Secondary | ICD-10-CM | POA: Diagnosis not present

## 2017-03-23 DIAGNOSIS — Z79899 Other long term (current) drug therapy: Secondary | ICD-10-CM | POA: Diagnosis not present

## 2017-03-23 DIAGNOSIS — Z86718 Personal history of other venous thrombosis and embolism: Secondary | ICD-10-CM | POA: Diagnosis not present

## 2017-03-23 DIAGNOSIS — K219 Gastro-esophageal reflux disease without esophagitis: Secondary | ICD-10-CM | POA: Diagnosis not present

## 2017-03-23 DIAGNOSIS — M109 Gout, unspecified: Secondary | ICD-10-CM | POA: Diagnosis not present

## 2017-03-23 DIAGNOSIS — M75122 Complete rotator cuff tear or rupture of left shoulder, not specified as traumatic: Secondary | ICD-10-CM | POA: Diagnosis not present

## 2017-03-23 DIAGNOSIS — I10 Essential (primary) hypertension: Secondary | ICD-10-CM | POA: Diagnosis not present

## 2017-03-23 DIAGNOSIS — G4730 Sleep apnea, unspecified: Secondary | ICD-10-CM | POA: Diagnosis not present

## 2017-03-23 LAB — BASIC METABOLIC PANEL
ANION GAP: 8 (ref 5–15)
BUN: 16 mg/dL (ref 6–20)
CALCIUM: 8.9 mg/dL (ref 8.9–10.3)
CO2: 23 mmol/L (ref 22–32)
CREATININE: 0.91 mg/dL (ref 0.61–1.24)
Chloride: 104 mmol/L (ref 101–111)
GFR calc Af Amer: >60 mL/min (ref >60)
GLUCOSE: 93 mg/dL (ref 65–99)
Potassium: 4 mmol/L (ref 3.5–5.1)
Sodium: 135 mmol/L (ref 135–145)

## 2017-03-23 LAB — CBC
HCT: 41.8 % (ref 40.0–52.0)
HEMOGLOBIN: 13.9 g/dL (ref 13.0–18.0)
MCH: 29.2 pg (ref 26.0–34.0)
MCHC: 33.4 g/dL (ref 32.0–36.0)
MCV: 87.6 fL (ref 80.0–100.0)
PLATELETS: 238 10*3/uL (ref 150–440)
RBC: 4.77 MIL/uL (ref 4.40–5.90)
RDW: 14.4 % (ref 11.5–14.5)
WBC: 6.3 10*3/uL (ref 3.8–10.6)

## 2017-03-23 MED ORDER — CLINDAMYCIN PHOSPHATE 900 MG/50ML IV SOLN
900.0000 mg | Freq: Once | INTRAVENOUS | Status: AC
  Administered 2017-03-24: 900 mg via INTRAVENOUS

## 2017-03-24 ENCOUNTER — Ambulatory Visit: Payer: BLUE CROSS/BLUE SHIELD | Admitting: Certified Registered"

## 2017-03-24 ENCOUNTER — Other Ambulatory Visit: Payer: Self-pay

## 2017-03-24 ENCOUNTER — Encounter: Payer: Self-pay | Admitting: *Deleted

## 2017-03-24 ENCOUNTER — Ambulatory Visit
Admission: RE | Admit: 2017-03-24 | Discharge: 2017-03-24 | Disposition: A | Discharge status: Home / Self Care | Payer: BLUE CROSS/BLUE SHIELD | Source: Ambulatory Visit | Attending: Surgery | Admitting: Surgery

## 2017-03-24 ENCOUNTER — Encounter
Admission: RE | Disposition: A | Discharge status: Home / Self Care | Payer: Self-pay | Source: Ambulatory Visit | Attending: Surgery

## 2017-03-24 DIAGNOSIS — J45909 Unspecified asthma, uncomplicated: Secondary | ICD-10-CM | POA: Insufficient documentation

## 2017-03-24 DIAGNOSIS — Z79899 Other long term (current) drug therapy: Secondary | ICD-10-CM | POA: Insufficient documentation

## 2017-03-24 DIAGNOSIS — K219 Gastro-esophageal reflux disease without esophagitis: Secondary | ICD-10-CM | POA: Insufficient documentation

## 2017-03-24 DIAGNOSIS — M75122 Complete rotator cuff tear or rupture of left shoulder, not specified as traumatic: Secondary | ICD-10-CM | POA: Insufficient documentation

## 2017-03-24 DIAGNOSIS — I10 Essential (primary) hypertension: Secondary | ICD-10-CM | POA: Insufficient documentation

## 2017-03-24 DIAGNOSIS — S43432A Superior glenoid labrum lesion of left shoulder, initial encounter: Secondary | ICD-10-CM | POA: Insufficient documentation

## 2017-03-24 DIAGNOSIS — M109 Gout, unspecified: Secondary | ICD-10-CM | POA: Insufficient documentation

## 2017-03-24 DIAGNOSIS — M7502 Adhesive capsulitis of left shoulder: Secondary | ICD-10-CM | POA: Insufficient documentation

## 2017-03-24 DIAGNOSIS — G4730 Sleep apnea, unspecified: Secondary | ICD-10-CM | POA: Insufficient documentation

## 2017-03-24 DIAGNOSIS — Z86718 Personal history of other venous thrombosis and embolism: Secondary | ICD-10-CM | POA: Insufficient documentation

## 2017-03-24 HISTORY — PX: SHOULDER ARTHROSCOPY WITH OPEN ROTATOR CUFF REPAIR: SHX6092

## 2017-03-24 SURGERY — ARTHROSCOPY, SHOULDER WITH REPAIR, ROTATOR CUFF, OPEN
Anesthesia: Regional | Site: Shoulder | Laterality: Left | Wound class: Clean

## 2017-03-24 MED ORDER — OXYCODONE HCL 5 MG/5ML PO SOLN: 5.0000 mg | Freq: Once | ORAL | Status: DC | PRN

## 2017-03-24 MED ORDER — OXYCODONE HCL 5 MG PO TABS: 5.0000 mg | ORAL_TABLET | ORAL | 0 refills | Status: DC | PRN

## 2017-03-24 MED ORDER — LIDOCAINE HCL (PF) 1 % IJ SOLN
INTRAMUSCULAR | Status: DC | PRN
  Administered 2017-03-24: 3 mL

## 2017-03-24 MED ORDER — OXYCODONE HCL 5 MG PO TABS
5.0000 mg | ORAL_TABLET | ORAL | Status: DC | PRN
  Filled 2017-03-24: qty 2

## 2017-03-24 MED ORDER — GLYCOPYRROLATE 0.2 MG/ML IJ SOLN
INTRAMUSCULAR | Status: AC
  Filled 2017-03-24: qty 1

## 2017-03-24 MED ORDER — LACTATED RINGERS IV SOLN
INTRAVENOUS | Status: DC
  Administered 2017-03-24 (×2): via INTRAVENOUS

## 2017-03-24 MED ORDER — CLINDAMYCIN PHOSPHATE 900 MG/50ML IV SOLN
INTRAVENOUS | Status: AC
  Filled 2017-03-24: qty 50

## 2017-03-24 MED ORDER — SUCCINYLCHOLINE CHLORIDE 20 MG/ML IJ SOLN
INTRAMUSCULAR | Status: DC | PRN
  Administered 2017-03-24: 120 mg via INTRAVENOUS

## 2017-03-24 MED ORDER — FENTANYL CITRATE (PF) 100 MCG/2ML IJ SOLN
INTRAMUSCULAR | Status: AC
  Filled 2017-03-24: qty 2

## 2017-03-24 MED ORDER — DEXAMETHASONE SODIUM PHOSPHATE 10 MG/ML IJ SOLN
INTRAMUSCULAR | Status: DC | PRN
  Administered 2017-03-24: 5 mg via INTRAVENOUS

## 2017-03-24 MED ORDER — FENTANYL CITRATE (PF) 100 MCG/2ML IJ SOLN
INTRAMUSCULAR | Status: AC
  Administered 2017-03-24: 50 ug via INTRAVENOUS
  Filled 2017-03-24: qty 2

## 2017-03-24 MED ORDER — PROPOFOL 10 MG/ML IV BOLUS
INTRAVENOUS | Status: DC | PRN
  Administered 2017-03-24: 200 mg via INTRAVENOUS

## 2017-03-24 MED ORDER — ONDANSETRON HCL 4 MG/2ML IJ SOLN
INTRAMUSCULAR | Status: DC | PRN
  Administered 2017-03-24: 4 mg via INTRAVENOUS

## 2017-03-24 MED ORDER — LIDOCAINE HCL (PF) 2 % IJ SOLN
INTRAMUSCULAR | Status: AC
  Filled 2017-03-24: qty 10

## 2017-03-24 MED ORDER — FENTANYL CITRATE (PF) 100 MCG/2ML IJ SOLN
INTRAMUSCULAR | Status: AC
Start: 2017-03-24 — End: ?
  Filled 2017-03-24: qty 2

## 2017-03-24 MED ORDER — BUPIVACAINE HCL (PF) 0.5 % IJ SOLN
INTRAMUSCULAR | Status: AC
  Filled 2017-03-24: qty 10

## 2017-03-24 MED ORDER — FENTANYL CITRATE (PF) 100 MCG/2ML IJ SOLN
50.0000 ug | Freq: Once | INTRAMUSCULAR | Status: AC
  Administered 2017-03-24: 50 ug via INTRAVENOUS

## 2017-03-24 MED ORDER — GLYCOPYRROLATE 0.2 MG/ML IJ SOLN
INTRAMUSCULAR | Status: DC | PRN
  Administered 2017-03-24: 0.2 mg via INTRAVENOUS

## 2017-03-24 MED ORDER — LIDOCAINE HCL (PF) 1 % IJ SOLN
INTRAMUSCULAR | Status: AC
  Filled 2017-03-24: qty 5

## 2017-03-24 MED ORDER — ROCURONIUM BROMIDE 100 MG/10ML IV SOLN
INTRAVENOUS | Status: DC | PRN
  Administered 2017-03-24: 10 mg via INTRAVENOUS
  Administered 2017-03-24: 30 mg via INTRAVENOUS
  Administered 2017-03-24: 10 mg via INTRAVENOUS

## 2017-03-24 MED ORDER — METOCLOPRAMIDE HCL 5 MG/ML IJ SOLN: 5.0000 mg | Freq: Three times a day (TID) | INTRAMUSCULAR | Status: DC | PRN

## 2017-03-24 MED ORDER — SUGAMMADEX SODIUM 200 MG/2ML IV SOLN
INTRAVENOUS | Status: DC | PRN
  Administered 2017-03-24: 300 mg via INTRAVENOUS

## 2017-03-24 MED ORDER — FENTANYL CITRATE (PF) 100 MCG/2ML IJ SOLN
INTRAMUSCULAR | Status: DC | PRN
  Administered 2017-03-24 (×4): 50 ug via INTRAVENOUS

## 2017-03-24 MED ORDER — METOCLOPRAMIDE HCL 10 MG PO TABS: 5.0000 mg | ORAL_TABLET | Freq: Three times a day (TID) | ORAL | Status: DC | PRN

## 2017-03-24 MED ORDER — BUPIVACAINE LIPOSOME 1.3 % IJ SUSP
INTRAMUSCULAR | Status: DC | PRN
  Administered 2017-03-24: 20 mL via PERINEURAL

## 2017-03-24 MED ORDER — SUGAMMADEX SODIUM 500 MG/5ML IV SOLN
INTRAVENOUS | Status: AC
  Filled 2017-03-24: qty 5

## 2017-03-24 MED ORDER — MIDAZOLAM HCL 2 MG/2ML IJ SOLN
INTRAMUSCULAR | Status: AC
  Administered 2017-03-24: 1 mg via INTRAVENOUS
  Filled 2017-03-24: qty 2

## 2017-03-24 MED ORDER — BUPIVACAINE-EPINEPHRINE 0.5% -1:200000 IJ SOLN
INTRAMUSCULAR | Status: DC | PRN
  Administered 2017-03-24: 30 mL

## 2017-03-24 MED ORDER — ONDANSETRON HCL 4 MG/2ML IJ SOLN
INTRAMUSCULAR | Status: AC
  Filled 2017-03-24: qty 2

## 2017-03-24 MED ORDER — FAMOTIDINE 20 MG PO TABS
20.0000 mg | ORAL_TABLET | Freq: Once | ORAL | Status: AC
  Administered 2017-03-24: 20 mg via ORAL

## 2017-03-24 MED ORDER — LACTATED RINGERS IV SOLN
INTRAVENOUS | Status: DC | PRN
  Administered 2017-03-24: 2 mL

## 2017-03-24 MED ORDER — FENTANYL CITRATE (PF) 100 MCG/2ML IJ SOLN: 25.0000 ug | INTRAMUSCULAR | Status: DC | PRN

## 2017-03-24 MED ORDER — MIDAZOLAM HCL 2 MG/2ML IJ SOLN
INTRAMUSCULAR | Status: AC
  Filled 2017-03-24: qty 2

## 2017-03-24 MED ORDER — LIDOCAINE HCL (CARDIAC) 20 MG/ML IV SOLN
INTRAVENOUS | Status: DC | PRN
  Administered 2017-03-24: 50 mg via INTRAVENOUS

## 2017-03-24 MED ORDER — POTASSIUM CHLORIDE IN NACL 20-0.9 MEQ/L-% IV SOLN
INTRAVENOUS | Status: DC
  Filled 2017-03-24 (×5): qty 1000

## 2017-03-24 MED ORDER — MEPERIDINE HCL 50 MG/ML IJ SOLN: 6.2500 mg | INTRAMUSCULAR | Status: DC | PRN

## 2017-03-24 MED ORDER — MIDAZOLAM HCL 2 MG/2ML IJ SOLN
1.0000 mg | Freq: Once | INTRAMUSCULAR | Status: AC
  Administered 2017-03-24: 1 mg via INTRAVENOUS

## 2017-03-24 MED ORDER — ONDANSETRON HCL 4 MG/2ML IJ SOLN
4.0000 mg | Freq: Four times a day (QID) | INTRAMUSCULAR | Status: DC | PRN
  Administered 2017-03-24: 4 mg via INTRAVENOUS

## 2017-03-24 MED ORDER — SUCCINYLCHOLINE CHLORIDE 20 MG/ML IJ SOLN
INTRAMUSCULAR | Status: AC
  Filled 2017-03-24: qty 1

## 2017-03-24 MED ORDER — PROPOFOL 10 MG/ML IV BOLUS
INTRAVENOUS | Status: AC
  Filled 2017-03-24: qty 20

## 2017-03-24 MED ORDER — BUPIVACAINE HCL (PF) 0.5 % IJ SOLN
INTRAMUSCULAR | Status: DC | PRN
  Administered 2017-03-24: 10 mL via PERINEURAL

## 2017-03-24 MED ORDER — PROMETHAZINE HCL 25 MG/ML IJ SOLN: 6.2500 mg | INTRAMUSCULAR | Status: DC | PRN

## 2017-03-24 MED ORDER — FAMOTIDINE 20 MG PO TABS
ORAL_TABLET | ORAL | Status: AC
  Administered 2017-03-24: 20 mg via ORAL
  Filled 2017-03-24: qty 1

## 2017-03-24 MED ORDER — BUPIVACAINE LIPOSOME 1.3 % IJ SUSP
INTRAMUSCULAR | Status: AC
  Filled 2017-03-24: qty 20

## 2017-03-24 MED ORDER — ROCURONIUM BROMIDE 50 MG/5ML IV SOLN
INTRAVENOUS | Status: AC
  Filled 2017-03-24: qty 1

## 2017-03-24 MED ORDER — ONDANSETRON HCL 4 MG PO TABS: 4.0000 mg | ORAL_TABLET | Freq: Four times a day (QID) | ORAL | Status: DC | PRN

## 2017-03-24 MED ORDER — DEXAMETHASONE SODIUM PHOSPHATE 10 MG/ML IJ SOLN
INTRAMUSCULAR | Status: AC
  Filled 2017-03-24: qty 1

## 2017-03-24 MED ORDER — OXYCODONE HCL 5 MG PO TABS: 5.0000 mg | ORAL_TABLET | Freq: Once | ORAL | Status: DC | PRN

## 2017-03-24 MED ORDER — BUPIVACAINE-EPINEPHRINE (PF) 0.5% -1:200000 IJ SOLN
INTRAMUSCULAR | Status: AC
  Filled 2017-03-24: qty 30

## 2017-03-24 MED ORDER — EPINEPHRINE PF 1 MG/ML IJ SOLN
INTRAMUSCULAR | Status: AC
  Filled 2017-03-24: qty 2

## 2017-03-24 SURGICAL SUPPLY — 49 items
ANCH SUT 2 2.9 2 LD TPR NDL (Anchor) ×4 IMPLANT
ANCH SUT KNTLS STRL SHLDR SYS (Anchor) ×2 IMPLANT
ANCHOR JUGGERKNOT WTAP NDL 2.9 (Anchor) ×8 IMPLANT
ANCHOR SUT QUATTRO KNTLS 4.5 (Anchor) ×4 IMPLANT
BIT DRILL JUGRKNT W/NDL BIT2.9 (DRILL) IMPLANT
BLADE FULL RADIUS 3.5 (BLADE) ×3 IMPLANT
BUR ACROMIONIZER 4.0 (BURR) ×3 IMPLANT
CANNULA SHAVER 8MMX76MM (CANNULA) ×3 IMPLANT
CHLORAPREP W/TINT 26ML (MISCELLANEOUS) ×3 IMPLANT
COVER MAYO STAND STRL (DRAPES) ×3 IMPLANT
DRAPE IMP U-DRAPE 54X76 (DRAPES) ×6 IMPLANT
DRILL JUGGERKNOT W/NDL BIT 2.9 (DRILL) ×3
DRSG OPSITE POSTOP 4X8 (GAUZE/BANDAGES/DRESSINGS) ×3 IMPLANT
ELECT REM PT RETURN 9FT ADLT (ELECTROSURGICAL) ×3
ELECTRODE REM PT RTRN 9FT ADLT (ELECTROSURGICAL) ×1 IMPLANT
GAUZE PETRO XEROFOAM 1X8 (MISCELLANEOUS) ×3 IMPLANT
GAUZE SPONGE 4X4 12PLY STRL (GAUZE/BANDAGES/DRESSINGS) ×3 IMPLANT
GLOVE BIO SURGEON STRL SZ7.5 (GLOVE) ×6 IMPLANT
GLOVE BIO SURGEON STRL SZ8 (GLOVE) ×6 IMPLANT
GLOVE BIOGEL PI IND STRL 8 (GLOVE) ×1 IMPLANT
GLOVE BIOGEL PI INDICATOR 8 (GLOVE) ×2
GLOVE INDICATOR 8.0 STRL GRN (GLOVE) ×3 IMPLANT
GOWN STRL REUS W/ TWL LRG LVL3 (GOWN DISPOSABLE) ×1 IMPLANT
GOWN STRL REUS W/ TWL XL LVL3 (GOWN DISPOSABLE) ×1 IMPLANT
GOWN STRL REUS W/TWL LRG LVL3 (GOWN DISPOSABLE) ×3
GOWN STRL REUS W/TWL XL LVL3 (GOWN DISPOSABLE) ×3
GRASPER SUT 15 45D LOW PRO (SUTURE) IMPLANT
IV LACTATED RINGER IRRG 3000ML (IV SOLUTION) ×6
IV LR IRRIG 3000ML ARTHROMATIC (IV SOLUTION) ×2 IMPLANT
MANIFOLD NEPTUNE II (INSTRUMENTS) ×3 IMPLANT
MASK FACE SPIDER DISP (MASK) ×3 IMPLANT
MAT BLUE FLOOR 46X72 FLO (MISCELLANEOUS) ×3 IMPLANT
NDL MAYO CATGUT SZ5 (NEEDLE)
NDL REVERSE CUT 1/2 CRC (NEEDLE) IMPLANT
NDL SUT 5 .5 CRC TPR PNT MAYO (NEEDLE) IMPLANT
NEEDLE REVERSE CUT 1/2 CRC (NEEDLE) IMPLANT
PACK ARTHROSCOPY SHOULDER (MISCELLANEOUS) ×3 IMPLANT
SLING ARM LRG DEEP (SOFTGOODS) ×3 IMPLANT
SLING ULTRA II LG (MISCELLANEOUS) ×3 IMPLANT
STAPLER SKIN PROX 35W (STAPLE) ×3 IMPLANT
STRAP SAFETY BODY (MISCELLANEOUS) ×3 IMPLANT
SUT ETHIBOND 0 MO6 C/R (SUTURE) ×3 IMPLANT
SUT VIC AB 2-0 CT1 27 (SUTURE) ×6
SUT VIC AB 2-0 CT1 TAPERPNT 27 (SUTURE) ×2 IMPLANT
TAPE MICROFOAM 4IN (TAPE) ×3 IMPLANT
TUBING ARTHRO INFLOW-ONLY STRL (TUBING) ×3 IMPLANT
TUBING CONNECTING 10 (TUBING) ×2 IMPLANT
TUBING CONNECTING 10' (TUBING) ×1
WAND HAND CNTRL MULTIVAC 90 (MISCELLANEOUS) ×3 IMPLANT

## 2017-03-24 NOTE — Discharge Instructions (Addendum)
Interscalene Nerve Block with Exparel  1.  For your surgery you have received an Interscalene Nerve Block with Exparel. 2. Nerve Blocks affect many types of nerves, including nerves that control movement, pain and normal sensation.  You may experience feelings such as numbness, tingling, heaviness, weakness or the inability to move your arm or the feeling or sensation that your arm has "fallen asleep". 3. A nerve block with Exparel can last up to 5 days.  Usually the weakness wears off first.  The tingling and heaviness usually wear off next.  Finally you may start to notice pain.  Keep in mind that this may occur in any order.  Once a nerve block starts to wear off it is usually completely gone within 60 minutes. 4. ISNB may cause mild shortness of breath, a hoarse voice, blurry vision, unequal pupils, or drooping of the face on the same side as the nerve block.  These symptoms will usually resolve with the numbness.  Very rarely the procedure itself can cause mild seizures. 5. If needed, your surgeon will give you a prescription for pain medication.  It will take about 60 minutes for the oral pain medication to become fully effective.  So, it is recommended that you start taking this medication before the nerve block first begins to wear off, or when you first begin to feel discomfort. 6. Take your pain medication only as prescribed.  Pain medication can cause sedation and decrease your breathing if you take more than you need for the level of pain that you have. 7. Nausea is a common side effect of many pain medications.  You may want to eat something before taking your pain medicine to prevent nausea. 8. After an Interscalene nerve block, you cannot feel pain, pressure or extremes in temperature in the effected arm.  Because your arm is numb it is at an increased risk for injury.  To decrease the possibility of injury, please practice the following:  a. While you are awake change the position of  your arm frequently to prevent too much pressure on any one area for prolonged periods of time. b.  If you have a cast or tight dressing, check the color or your fingers every couple of hours.  Call your surgeon with the appearance of any discoloration (white or blue). c. If you are given a sling to wear before you go home, please wear it  at all times until the block has completely worn off.  Do not get up at night without your sling. d. Please contact [facility] or your surgeon if you do not begin to regain sensation after 7 days from the surgery.  Anesthesia may be contacted by calling the Same Day Surgery Department, Mon. through Fri., 6 am to 4 pm at [phone].   e. If you experience any other problems or concerns, please contact your surgeon's office. f. If you experience severe or prolonged shortness of breath go to the nearest emergency department.   Keep dressing dry and intact.  May shower after dressing changed on post-op day #4 (Monday).  Cover staples with Band-Aids after drying off. Apply ice frequently to shoulder. Take ibuprofen 800 mg TID with meals for 7-10 days, then as necessary. Take oxycodone as prescribed when needed.  May supplement with ES Tylenol if necessary. Keep shoulder immobilizer on at all times except may remove for bathing purposes. Follow-up in 10-14 days or as scheduled.     AMBULATORY SURGERY  DISCHARGE INSTRUCTIONS  1) The drugs that you were given will stay in your system until tomorrow so for the next 24 hours you should not:  A) Drive an automobile B) Make any legal decisions C) Drink any alcoholic beverage   2) You may resume regular meals tomorrow.  Today it is better to start with liquids and gradually work up to solid foods.  You may eat anything you prefer, but it is better to start with liquids, then soup and crackers, and gradually work up to solid foods.   3) Please notify your doctor immediately if you have any unusual  bleeding, trouble breathing, redness and pain at the surgery site, drainage, fever, or pain not relieved by medication.    4) Additional Instructions:        Please contact your physician with any problems or Same Day Surgery at [phone], Monday through Friday 6 am to 4 pm, or [place] at [facility] number at [phone].

## 2017-03-24 NOTE — Anesthesia Post-op Follow-up Note (Signed)
Anesthesia QCDR form completed.        

## 2017-03-24 NOTE — H&P (Signed)
Paper H&P to be scanned into permanent record. H&P reviewed and patient re-examined. No changes. 

## 2017-03-24 NOTE — OR Nursing (Signed)
[place] ready for dc home and became nauseated.  Zofran 4 mg iv given and a fluid bolus continued

## 2017-03-24 NOTE — Transfer of Care (Signed)
Immediate Anesthesia Transfer of Care Note  Patient: Sean Garza  Procedure(s) Performed: SHOULDER ARTHROSCOPY WITH OPEN ROTATOR CUFF REPAIR (Left Shoulder)  Patient Location: PACU  Anesthesia Type:GA combined with regional for post-op pain  Level of Consciousness: awake  Airway & Oxygen Therapy: Patient Spontanous Breathing and Patient connected to face mask oxygen  Post-op Assessment: Report given to RN and Post -op Vital signs reviewed and stable  Post vital signs: Reviewed  Last Vitals:  Vitals:   03/24/17 1012 03/24/17 1243  BP:  (!) 139/94  Pulse: 66   Resp: 16 13  Temp:  36.6 C  SpO2: 98% 96%    Last Pain:  Vitals:   03/24/17 0930  TempSrc:   PainSc: 0-No pain         Complications: No apparent anesthesia complications

## 2017-03-24 NOTE — OR Nursing (Signed)
1525 Nausea much better.  "I want to go home".  Iv dc'd.  Transferred to [place].  Peppermint oil on cotton ball sent home for ride home.
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
## 2017-03-24 NOTE — Anesthesia Procedure Notes (Signed)
Anesthesia Regional Block: Interscalene brachial plexus block   Pre-Anesthetic Checklist: ,, timeout performed, Correct Patient, Correct Site, Correct Laterality, Correct Procedure, Correct Position, site marked, Risks and benefits discussed,  Surgical consent,  Pre-op evaluation,  At surgeon's request and post-op pain management  Laterality: Left  Prep: chloraprep       Needles:  Injection technique: Single-shot  Needle Type: Stimiplex     Needle Length: 10cm  Needle Gauge: 20     Additional Needles:   Procedures:,,,, ultrasound used (permanent image in chart),,,,  Narrative:  Start time: 03/24/2017 9:25 AM End time: 03/24/2017 9:34 AM Injection made incrementally with aspirations every 5 mL.  Performed by: Personally  Anesthesiologist: Emmie Niemann, MD  Additional Notes: Functioning IV was confirmed and monitors were applied.  A Stimuplex needle was used. Sterile prep and drape,hand hygiene and sterile gloves were used.  Negative aspiration and negative test dose prior to incremental administration of local anesthetic. The patient tolerated the procedure well.
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
## 2017-03-24 NOTE — Anesthesia Postprocedure Evaluation (Signed)
Anesthesia Post Note  Patient: MARDELL CRAGG  Procedure(s) Performed: SHOULDER ARTHROSCOPY WITH OPEN ROTATOR CUFF REPAIR (Left Shoulder)  Patient location during evaluation: PACU Anesthesia Type: Regional Level of consciousness: awake and alert and oriented Pain management: pain level controlled Vital Signs Assessment: post-procedure vital signs reviewed and stable Respiratory status: spontaneous breathing, nonlabored ventilation and respiratory function stable Cardiovascular status: blood pressure returned to baseline and stable Postop Assessment: no signs of nausea or vomiting Anesthetic complications: no     Last Vitals:  Vitals:   03/24/17 1343 03/24/17 1358  BP: 123/86 136/81  Pulse: 78 74  Resp: 14 16  Temp: (!) 36.1 C 36.9 C  SpO2: 91% 93%    Last Pain:  Vitals:   03/24/17 1358  TempSrc: Oral  PainSc: 0-No pain                 Lucia Mccreadie

## 2017-03-24 NOTE — Anesthesia Procedure Notes (Signed)
Procedure Name: Intubation Performed by: Rolla Plate, CRNA Pre-anesthesia Checklist: Patient identified, Patient being monitored, Timeout performed, Emergency Drugs available and Suction available Patient Re-evaluated:Patient Re-evaluated prior to induction Oxygen Delivery Method: Circle system utilized Preoxygenation: Pre-oxygenation with 100% oxygen Induction Type: IV induction and Rapid sequence Ventilation: Mask ventilation without difficulty Laryngoscope Size: Miller and 2 Grade View: Grade I Tube type: Oral Tube size: 7.5 mm Number of attempts: 1 Airway Equipment and Method: Stylet Placement Confirmation: ETT inserted through vocal cords under direct vision,  positive ETCO2 and breath sounds checked- equal and bilateral Secured at: 22 cm Tube secured with: Tape Dental Injury: Teeth and Oropharynx as per pre-operative assessment

## 2017-03-25 ENCOUNTER — Encounter: Payer: Self-pay | Admitting: Surgery

## 2018-05-31 ENCOUNTER — Other Ambulatory Visit (HOSPITAL_COMMUNITY): Payer: Self-pay | Admitting: Urology

## 2018-05-31 ENCOUNTER — Other Ambulatory Visit: Payer: Self-pay | Admitting: Urology

## 2018-05-31 DIAGNOSIS — R102 Pelvic and perineal pain: Secondary | ICD-10-CM

## 2018-05-31 DIAGNOSIS — Z87442 Personal history of urinary calculi: Secondary | ICD-10-CM

## 2018-05-31 DIAGNOSIS — R109 Unspecified abdominal pain: Secondary | ICD-10-CM

## 2018-07-03 ENCOUNTER — Ambulatory Visit
Admission: RE | Admit: 2018-07-03 | Discharge: 2018-07-03 | Disposition: A | Discharge status: Home / Self Care | Payer: BLUE CROSS/BLUE SHIELD | Source: Ambulatory Visit | Attending: Urology | Admitting: Urology

## 2018-07-03 ENCOUNTER — Other Ambulatory Visit: Payer: Self-pay

## 2018-07-03 DIAGNOSIS — R109 Unspecified abdominal pain: Secondary | ICD-10-CM | POA: Diagnosis present

## 2018-07-03 DIAGNOSIS — Z87442 Personal history of urinary calculi: Secondary | ICD-10-CM | POA: Diagnosis present

## 2018-07-03 DIAGNOSIS — R102 Pelvic and perineal pain: Secondary | ICD-10-CM

## 2018-07-03 MED ORDER — IOHEXOL 300 MG/ML  SOLN
100.0000 mL | Freq: Once | INTRAMUSCULAR | Status: AC | PRN
  Administered 2018-07-03: 100 mL via INTRAVENOUS

## 2018-09-13 ENCOUNTER — Other Ambulatory Visit: Payer: Self-pay

## 2018-09-13 ENCOUNTER — Telehealth: Payer: Self-pay | Admitting: *Deleted

## 2018-09-13 DIAGNOSIS — Z20822 Contact with and (suspected) exposure to covid-19: Secondary | ICD-10-CM

## 2018-09-13 NOTE — Telephone Encounter (Signed)
TC from Dr Juanell Fairly office to schedule pt for covid testing. Positive exposure. Pt scheduled today @ 1:45 @ [facility]. Instructions given and order placed

## 2018-09-20 LAB — NOVEL CORONAVIRUS, NAA: SARS-CoV-2, NAA: NOT DETECTED

## 2019-02-01 ENCOUNTER — Other Ambulatory Visit: Payer: Self-pay | Admitting: Urology

## 2019-02-01 DIAGNOSIS — R972 Elevated prostate specific antigen [PSA]: Secondary | ICD-10-CM

## 2019-02-19 ENCOUNTER — Other Ambulatory Visit: Payer: Self-pay

## 2019-02-19 ENCOUNTER — Ambulatory Visit
Admission: RE | Admit: 2019-02-19 | Discharge: 2019-02-19 | Disposition: A | Discharge status: Home / Self Care | Payer: BC Managed Care – PPO | Source: Ambulatory Visit | Attending: Urology | Admitting: Urology

## 2019-02-19 DIAGNOSIS — R972 Elevated prostate specific antigen [PSA]: Secondary | ICD-10-CM | POA: Diagnosis not present

## 2019-02-19 MED ORDER — GADOBUTROL 1 MMOL/ML IV SOLN
10.0000 mL | Freq: Once | INTRAVENOUS | Status: AC | PRN
  Administered 2019-02-19: 12:00:00 10 mL via INTRAVENOUS

## 2019-04-05 ENCOUNTER — Emergency Department: Payer: BC Managed Care – PPO

## 2019-04-05 ENCOUNTER — Other Ambulatory Visit: Payer: Self-pay

## 2019-04-05 ENCOUNTER — Encounter: Payer: Self-pay | Admitting: Emergency Medicine

## 2019-04-05 ENCOUNTER — Inpatient Hospital Stay
Admission: EM | Admit: 2019-04-05 | Discharge: 2019-04-05 | DRG: 177 | Disposition: A | Discharge status: Other Acute Inpatient Hospital | Payer: BC Managed Care – PPO | Attending: Internal Medicine | Admitting: Internal Medicine

## 2019-04-05 ENCOUNTER — Inpatient Hospital Stay (HOSPITAL_COMMUNITY)
Admission: AD | Admit: 2019-04-05 | Discharge: 2019-04-17 | DRG: 177 | Disposition: A | Discharge status: Home / Self Care | Payer: BC Managed Care – PPO | Source: Other Acute Inpatient Hospital | Attending: Internal Medicine | Admitting: Internal Medicine

## 2019-04-05 ENCOUNTER — Encounter (HOSPITAL_COMMUNITY): Payer: Self-pay | Admitting: Internal Medicine

## 2019-04-05 DIAGNOSIS — Z8249 Family history of ischemic heart disease and other diseases of the circulatory system: Secondary | ICD-10-CM

## 2019-04-05 DIAGNOSIS — Z86718 Personal history of other venous thrombosis and embolism: Secondary | ICD-10-CM

## 2019-04-05 DIAGNOSIS — Z880 Allergy status to penicillin: Secondary | ICD-10-CM

## 2019-04-05 DIAGNOSIS — Z713 Dietary counseling and surveillance: Secondary | ICD-10-CM

## 2019-04-05 DIAGNOSIS — Z79899 Other long term (current) drug therapy: Secondary | ICD-10-CM | POA: Diagnosis not present

## 2019-04-05 DIAGNOSIS — R3911 Hesitancy of micturition: Secondary | ICD-10-CM | POA: Diagnosis not present

## 2019-04-05 DIAGNOSIS — J4520 Mild intermittent asthma, uncomplicated: Secondary | ICD-10-CM | POA: Diagnosis not present

## 2019-04-05 DIAGNOSIS — M069 Rheumatoid arthritis, unspecified: Secondary | ICD-10-CM | POA: Diagnosis present

## 2019-04-05 DIAGNOSIS — K219 Gastro-esophageal reflux disease without esophagitis: Secondary | ICD-10-CM | POA: Diagnosis present

## 2019-04-05 DIAGNOSIS — Z6841 Body Mass Index (BMI) 40.0 and over, adult: Secondary | ICD-10-CM | POA: Diagnosis not present

## 2019-04-05 DIAGNOSIS — U071 COVID-19: Principal | ICD-10-CM | POA: Diagnosis present

## 2019-04-05 DIAGNOSIS — J45909 Unspecified asthma, uncomplicated: Secondary | ICD-10-CM | POA: Diagnosis present

## 2019-04-05 DIAGNOSIS — I10 Essential (primary) hypertension: Secondary | ICD-10-CM | POA: Diagnosis present

## 2019-04-05 DIAGNOSIS — J9601 Acute respiratory failure with hypoxia: Secondary | ICD-10-CM | POA: Diagnosis present

## 2019-04-05 DIAGNOSIS — J90 Pleural effusion, not elsewhere classified: Secondary | ICD-10-CM | POA: Diagnosis present

## 2019-04-05 DIAGNOSIS — G4733 Obstructive sleep apnea (adult) (pediatric): Secondary | ICD-10-CM | POA: Diagnosis present

## 2019-04-05 DIAGNOSIS — M109 Gout, unspecified: Secondary | ICD-10-CM | POA: Diagnosis present

## 2019-04-05 DIAGNOSIS — J1282 Pneumonia due to coronavirus disease 2019: Secondary | ICD-10-CM | POA: Diagnosis not present

## 2019-04-05 DIAGNOSIS — E669 Obesity, unspecified: Secondary | ICD-10-CM

## 2019-04-05 DIAGNOSIS — J9600 Acute respiratory failure, unspecified whether with hypoxia or hypercapnia: Secondary | ICD-10-CM | POA: Diagnosis not present

## 2019-04-05 DIAGNOSIS — Z7951 Long term (current) use of inhaled steroids: Secondary | ICD-10-CM

## 2019-04-05 DIAGNOSIS — R600 Localized edema: Secondary | ICD-10-CM

## 2019-04-05 DIAGNOSIS — E66813 Obesity, class 3: Secondary | ICD-10-CM | POA: Diagnosis present

## 2019-04-05 DIAGNOSIS — Z8781 Personal history of (healed) traumatic fracture: Secondary | ICD-10-CM

## 2019-04-05 DIAGNOSIS — G4730 Sleep apnea, unspecified: Secondary | ICD-10-CM | POA: Diagnosis present

## 2019-04-05 LAB — CBC WITH DIFFERENTIAL/PLATELET
Abs Immature Granulocytes: 0.06 10*3/uL (ref 0.00–0.07)
Basophils Absolute: 0 10*3/uL (ref 0.0–0.1)
Basophils Relative: 0 %
Eosinophils Absolute: 0 10*3/uL (ref 0.0–0.5)
Eosinophils Relative: 0 %
HCT: 43.5 % (ref 39.0–52.0)
Hemoglobin: 14 g/dL (ref 13.0–17.0)
Immature Granulocytes: 1 %
Lymphocytes Relative: 8 %
Lymphs Abs: 0.7 10*3/uL (ref 0.7–4.0)
MCH: 28.2 pg (ref 26.0–34.0)
MCHC: 32.2 g/dL (ref 30.0–36.0)
MCV: 87.7 fL (ref 80.0–100.0)
Monocytes Absolute: 0.4 10*3/uL (ref 0.1–1.0)
Monocytes Relative: 5 %
Neutro Abs: 7.1 10*3/uL (ref 1.7–7.7)
Neutrophils Relative %: 86 %
Platelets: 237 10*3/uL (ref 150–400)
RBC: 4.96 MIL/uL (ref 4.22–5.81)
RDW: 15.3 % (ref 11.5–15.5)
WBC: 8.3 10*3/uL (ref 4.0–10.5)
nRBC: 0 % (ref 0.0–0.2)

## 2019-04-05 LAB — COMPREHENSIVE METABOLIC PANEL
ALT: 22 U/L (ref 0–44)
AST: 30 U/L (ref 15–41)
Albumin: 3.4 g/dL — ABNORMAL LOW (ref 3.5–5.0)
Alkaline Phosphatase: 61 U/L (ref 38–126)
Anion gap: 9 (ref 5–15)
BUN: 24 mg/dL — ABNORMAL HIGH (ref 6–20)
CO2: 25 mmol/L (ref 22–32)
Calcium: 8.4 mg/dL — ABNORMAL LOW (ref 8.9–10.3)
Chloride: 101 mmol/L (ref 98–111)
Creatinine, Ser: 1.01 mg/dL (ref 0.61–1.24)
GFR calc Af Amer: >60 mL/min (ref >60)
GFR calc non Af Amer: >60 mL/min (ref >60)
Glucose, Bld: 94 mg/dL (ref 70–99)
Potassium: 3.7 mmol/L (ref 3.5–5.1)
Sodium: 135 mmol/L (ref 135–145)
Total Bilirubin: 0.9 mg/dL (ref 0.3–1.2)
Total Protein: 8.6 g/dL — ABNORMAL HIGH (ref 6.5–8.1)

## 2019-04-05 LAB — TROPONIN I (HIGH SENSITIVITY)
Troponin I (High Sensitivity): 3 ng/L (ref <18)
Troponin I (High Sensitivity): 3 ng/L (ref <18)

## 2019-04-05 LAB — FERRITIN: Ferritin: 20 ng/mL — ABNORMAL LOW (ref 24–336)

## 2019-04-05 LAB — POC SARS CORONAVIRUS 2 AG -  ED: SARS Coronavirus 2 Ag: POSITIVE — AB

## 2019-04-05 LAB — TRIGLYCERIDES: Triglycerides: 58 mg/dL (ref <150)

## 2019-04-05 LAB — FIBRINOGEN: Fibrinogen: >750 mg/dL — ABNORMAL HIGH (ref 210–475)

## 2019-04-05 LAB — C-REACTIVE PROTEIN: CRP: 29.4 mg/dL — ABNORMAL HIGH (ref <1.0)

## 2019-04-05 LAB — FIBRIN DERIVATIVES D-DIMER (ARMC ONLY): Fibrin derivatives D-dimer (ARMC): 755.37 ng/mL (FEU) — ABNORMAL HIGH (ref 0.00–499.00)

## 2019-04-05 LAB — PROCALCITONIN: Procalcitonin: 0.1 ng/mL

## 2019-04-05 LAB — BRAIN NATRIURETIC PEPTIDE: B Natriuretic Peptide: 32 pg/mL (ref 0.0–100.0)

## 2019-04-05 LAB — LACTIC ACID, PLASMA
Lactic Acid, Venous: 1.5 mmol/L (ref 0.5–1.9)
Lactic Acid, Venous: 1.1 mmol/L (ref 0.5–1.9)

## 2019-04-05 LAB — LACTATE DEHYDROGENASE: LDH: 214 U/L — ABNORMAL HIGH (ref 98–192)

## 2019-04-05 MED ORDER — ALBUTEROL SULFATE HFA 108 (90 BASE) MCG/ACT IN AERS
2.0000 | INHALATION_SPRAY | Freq: Four times a day (QID) | RESPIRATORY_TRACT | Status: DC | PRN
  Filled 2019-04-05: qty 6.7

## 2019-04-05 MED ORDER — DEXAMETHASONE 6 MG PO TABS
6.0000 mg | ORAL_TABLET | ORAL | Status: DC
  Administered 2019-04-06 – 2019-04-14 (×9): 6 mg via ORAL
  Filled 2019-04-05 (×9): qty 1

## 2019-04-05 MED ORDER — MOMETASONE FURO-FORMOTEROL FUM 200-5 MCG/ACT IN AERO
2.0000 | INHALATION_SPRAY | Freq: Two times a day (BID) | RESPIRATORY_TRACT | Status: DC
  Administered 2019-04-05 – 2019-04-17 (×24): 2 via RESPIRATORY_TRACT
  Filled 2019-04-05: qty 8.8

## 2019-04-05 MED ORDER — ONDANSETRON HCL 4 MG/2ML IJ SOLN: 4.0000 mg | Freq: Four times a day (QID) | INTRAMUSCULAR | Status: DC | PRN

## 2019-04-05 MED ORDER — ONDANSETRON HCL 4 MG PO TABS: 4.0000 mg | ORAL_TABLET | Freq: Four times a day (QID) | ORAL | Status: DC | PRN

## 2019-04-05 MED ORDER — SODIUM CHLORIDE 0.9 % IV SOLN
200.0000 mg | Freq: Once | INTRAVENOUS | Status: AC
  Administered 2019-04-05: 15:00:00 200 mg via INTRAVENOUS
  Filled 2019-04-05: qty 200

## 2019-04-05 MED ORDER — SODIUM CHLORIDE 0.9 % IV SOLN
100.0000 mg | Freq: Every day | INTRAVENOUS | Status: DC
  Filled 2019-04-05: qty 20

## 2019-04-05 MED ORDER — ONDANSETRON HCL 4 MG PO TABS
4.0000 mg | ORAL_TABLET | Freq: Four times a day (QID) | ORAL | Status: DC | PRN
  Filled 2019-04-05: qty 1

## 2019-04-05 MED ORDER — ENOXAPARIN SODIUM 80 MG/0.8ML ~~LOC~~ SOLN
65.0000 mg | Freq: Every day | SUBCUTANEOUS | Status: DC
  Administered 2019-04-05: 22:00:00 65 mg via SUBCUTANEOUS
  Filled 2019-04-05: qty 0.8

## 2019-04-05 MED ORDER — DEXAMETHASONE SODIUM PHOSPHATE 10 MG/ML IJ SOLN
6.0000 mg | Freq: Once | INTRAMUSCULAR | Status: AC
  Administered 2019-04-05: 6 mg via INTRAVENOUS
  Filled 2019-04-05: qty 1

## 2019-04-05 MED ORDER — DEXAMETHASONE SODIUM PHOSPHATE 10 MG/ML IJ SOLN: 6.0000 mg | INTRAMUSCULAR | Status: DC

## 2019-04-05 MED ORDER — HYDROCOD POLST-CPM POLST ER 10-8 MG/5ML PO SUER
5.0000 mL | Freq: Two times a day (BID) | ORAL | Status: DC | PRN
  Administered 2019-04-06 – 2019-04-15 (×13): 5 mL via ORAL
  Filled 2019-04-05 (×13): qty 5

## 2019-04-05 MED ORDER — SODIUM CHLORIDE 0.9 % IV SOLN
100.0000 mg | Freq: Every day | INTRAVENOUS | Status: AC
  Administered 2019-04-06 – 2019-04-09 (×4): 100 mg via INTRAVENOUS
  Filled 2019-04-05 (×4): qty 20

## 2019-04-05 MED ORDER — GUAIFENESIN-DM 100-10 MG/5ML PO SYRP
10.0000 mL | ORAL_SOLUTION | ORAL | Status: DC | PRN
  Administered 2019-04-07 – 2019-04-16 (×3): 10 mL via ORAL
  Filled 2019-04-05 (×3): qty 10

## 2019-04-05 MED ORDER — DOCUSATE SODIUM 100 MG PO CAPS
100.0000 mg | ORAL_CAPSULE | Freq: Every day | ORAL | Status: DC
  Administered 2019-04-06 – 2019-04-17 (×10): 100 mg via ORAL
  Filled 2019-04-05 (×12): qty 1

## 2019-04-05 MED ORDER — GUAIFENESIN-DM 100-10 MG/5ML PO SYRP
10.0000 mL | ORAL_SOLUTION | ORAL | Status: DC | PRN
  Filled 2019-04-05: qty 10

## 2019-04-05 MED ORDER — IPRATROPIUM-ALBUTEROL 20-100 MCG/ACT IN AERS
1.0000 | INHALATION_SPRAY | Freq: Four times a day (QID) | RESPIRATORY_TRACT | Status: DC
  Administered 2019-04-05 – 2019-04-17 (×45): 1 via RESPIRATORY_TRACT
  Filled 2019-04-05: qty 4

## 2019-04-05 MED ORDER — ENOXAPARIN SODIUM 40 MG/0.4ML ~~LOC~~ SOLN: 40.0000 mg | Freq: Two times a day (BID) | SUBCUTANEOUS | Status: DC

## 2019-04-05 MED ORDER — ACETAMINOPHEN 325 MG PO TABS
650.0000 mg | ORAL_TABLET | Freq: Four times a day (QID) | ORAL | Status: DC | PRN
  Administered 2019-04-15: 22:00:00 650 mg via ORAL
  Filled 2019-04-05: qty 2

## 2019-04-05 MED ORDER — ASCORBIC ACID 500 MG PO TABS
500.0000 mg | ORAL_TABLET | Freq: Every day | ORAL | Status: DC
  Administered 2019-04-06 – 2019-04-17 (×12): 500 mg via ORAL
  Filled 2019-04-05 (×12): qty 1

## 2019-04-05 MED ORDER — ZINC SULFATE 220 (50 ZN) MG PO CAPS
220.0000 mg | ORAL_CAPSULE | Freq: Every day | ORAL | Status: DC
  Administered 2019-04-06 – 2019-04-17 (×12): 220 mg via ORAL
  Filled 2019-04-05 (×12): qty 1

## 2019-04-05 MED ORDER — ACETAMINOPHEN 325 MG PO TABS: 650.0000 mg | ORAL_TABLET | Freq: Four times a day (QID) | ORAL | Status: DC | PRN

## 2019-04-05 MED ORDER — ADULT MULTIVITAMIN W/MINERALS CH: 1.0000 | ORAL_TABLET | Freq: Every day | ORAL | Status: DC

## 2019-04-05 NOTE — ED Notes (Signed)
Attempted to call report to [facility], was told RN would call back.

## 2019-04-05 NOTE — ED Notes (Signed)
Report given to [facility], [employer] 

## 2019-04-05 NOTE — H&P (Addendum)
History and Physical:    Sean Garza   [id number] DOB: 28-Jul-1960 DOA: 04/05/2019  Referring MD/provider: Arta Silence, MD PCP: Albina Billet, MD   Patient coming from: Home  Chief Complaint: Shortness of breath  History of Present Illness:   Sean Garza is an 59 y.o. male with medical history significant for asthma, rheumatoid arthritis, GERD, gout, history of DVT, hypertension, OSA, presented to the hospital because of increasing shortness of breath.  He said he started feeling short of breath about 5 days ago.  He went to see his PCP on Monday, April 02, 2019.  He said he was told he had "flu" so he was prescribed azithromycin.  However, he has not gotten any better.  Shortness of breath has progressively worsened.  He is inhaler more often without any relief.  He also has a cough which is sometimes productive of bloody sputum.  He had a fever this morning and when he checked his temperature this morning it was around 100.  He went to an outpatient facility for Covid test and he was told it would take about 72 hours to get the results back.  He presented to the ED today for further management.  No chest pain, vomiting, diarrhea, abdominal pain or wheezing.  ED Course:  The patient was tachypneic and hypoxemic in the emergency room.  O2 sat was 79% on room air and went up to 88% on 2 L of oxygen.  He is requiring 5 L of oxygen via nasal cannula.  Covid test was positive and chest x-ray showed bilateral pneumonia.  Lactic acid procalcitonin and troponins were normal.  Ferritin was actually on the low side.  ROS:   ROS all other systems reviewed were negative  Past Medical History:   Past Medical History:  Diagnosis Date  . Asthma   . Collagen vascular disease (HCC)    Rhematoid Arthritis  . GERD (gastroesophageal reflux disease)   . Gout   . Hx of blood clots   . Hypertension   . Sleep apnea     Past Surgical History:   Past Surgical History:  Procedure  Laterality Date  . ACHILLES TENDON REPAIR    . COLONOSCOPY WITH PROPOFOL N/A 12/13/2016   Procedure: COLONOSCOPY WITH PROPOFOL;  Surgeon: Manya Silvas, MD;  Location: [facility] ENDOSCOPY;  Service: Endoscopy;  Laterality: N/A;  . EYE SURGERY     floppy eye syndrome surgery  . SHOULDER ARTHROSCOPY WITH OPEN ROTATOR CUFF REPAIR Left 03/24/2017   Procedure: SHOULDER ARTHROSCOPY WITH OPEN ROTATOR CUFF REPAIR;  Surgeon: Corky Mull, MD;  Location: [facility] ORS;  Service: Orthopedics;  Laterality: Left;  . UVULOPALATOPHARYNGOPLASTY      Social History:   Social History   Socioeconomic History  . Marital status: Single    Spouse name: Not on file  . Number of children: Not on file  . Years of education: Not on file  . Highest education level: Not on file  Occupational History  . Not on file  Tobacco Use  . Smoking status: Never Smoker  . Smokeless tobacco: Never Used  Substance and Sexual Activity  . Alcohol use: No  . Drug use: No  . Sexual activity: Not on file  Other Topics Concern  . Not on file  Social History Narrative  . Not on file   Social Determinants of Health   Financial Resource Strain:   . Difficulty of Paying Living Expenses: Not on file  Food Insecurity:   .  Worried About Charity fundraiser in the Last Year: Not on file  . Ran Out of Food in the Last Year: Not on file  Transportation Needs:   . Lack of Transportation (Medical): Not on file  . Lack of Transportation (Non-Medical): Not on file  Physical Activity:   . Days of Exercise per Week: Not on file  . Minutes of Exercise per Session: Not on file  Stress:   . Feeling of Stress : Not on file  Social Connections:   . Frequency of Communication with Friends and Family: Not on file  . Frequency of Social Gatherings with Friends and Family: Not on file  . Attends Religious Services: Not on file  . Active Member of Clubs or Organizations: Not on file  . Attends Archivist Meetings: Not on file  .  Marital Status: Not on file  Intimate Partner Violence:   . Fear of Current or Ex-Partner: Not on file  . Emotionally Abused: Not on file  . Physically Abused: Not on file  . Sexually Abused: Not on file    Allergies   Penicillins  Family history:   Family History  Problem Relation Age of Onset  . CAD Father   . CAD Brother     Current Medications:   Prior to Admission medications   Medication Sig Start Date End Date Taking? Authorizing Provider  albuterol (PROVENTIL HFA;VENTOLIN HFA) 108 (90 Base) MCG/ACT inhaler Inhale 2 puffs into the lungs every 6 (six) hours as needed for wheezing or shortness of breath.     [provider]  budesonide-formoterol (SYMBICORT) 160-4.5 MCG/ACT inhaler Inhale 2 puffs into the lungs 2 (two) times daily as needed (for wheezing or shortness of breath.).     [provider]  Multiple Vitamin (MULTIVITAMIN WITH MINERALS) TABS tablet Take 1 tablet by mouth daily.    [provider]  sildenafil (REVATIO) 20 MG tablet Take 20 mg by mouth daily. As needed    [provider]    Physical Exam:   Vitals:   04/05/19 1228 04/05/19 1412 04/05/19 1432 04/05/19 1600  BP: (!) 121/103 (!) 143/104 112/71 106/77  Pulse: 81 85 88 81  Resp: (!) 28 (!) 31 (!) 35 (!) 21  Temp: 98.1 F (36.7 C)     TempSrc: Oral     SpO2: 96% 94% 96% 94%     Physical Exam: Blood pressure 106/77, pulse 81, temperature 98.1 F (36.7 C), temperature source Oral, resp. rate (!) 21, SpO2 94 %. Gen: No acute distress. Head: Normocephalic, atraumatic. Eyes: Pupils equal, round and reactive to light. Extraocular movements intact.  Sclerae nonicteric.  Mouth: Dry mucous membranes Neck: Supple, no thyromegaly, no lymphadenopathy, no jugular venous distention. Chest: Entry adequate bilaterally, no wheezing heard.  He has bibasilar rales.   CV: Heart sounds are regular with an S1, S2. No murmurs, rubs or gallops.  Abdomen: Soft, nontender,obese  with normal active bowel sounds. No palpable masses. Extremities: Extremities are without clubbing, or cyanosis. No edema. Pedal pulses 2+.  Skin: Warm and dry. No rashes, lesions or wounds Neuro: Alert and oriented times 3; grossly nonfocal.  Psych: Insight is good and judgment is appropriate. Mood and affect normal.   Data Review:    Labs: Basic Metabolic Panel: Recent Labs  Lab 04/05/19 1230  NA 135  K 3.7  CL 101  CO2 25  GLUCOSE 94  BUN 24*  CREATININE 1.01  CALCIUM 8.4*   Liver Function Tests: Recent  Labs  Lab 04/05/19 1230  AST 30  ALT 22  ALKPHOS 61  BILITOT 0.9  PROT 8.6*  ALBUMIN 3.4*   No results for input(s): LIPASE, AMYLASE in the last 168 hours. No results for input(s): AMMONIA in the last 168 hours. CBC: Recent Labs  Lab 04/05/19 1230  WBC 8.3  NEUTROABS 7.1  HGB 14.0  HCT 43.5  MCV 87.7  PLT 237   Cardiac Enzymes: No results for input(s): CKTOTAL, CKMB, CKMBINDEX, TROPONINI in the last 168 hours.  BNP (last 3 results) No results for input(s): PROBNP in the last 8760 hours. CBG: No results for input(s): GLUCAP in the last 168 hours.  Urinalysis No results found for: COLORURINE, APPEARANCEUR, LABSPEC, PHURINE, GLUCOSEU, HGBUR, BILIRUBINUR, KETONESUR, PROTEINUR, UROBILINOGEN, NITRITE, LEUKOCYTESUR    Radiographic Studies: DG Chest 2 View  Result Date: 04/05/2019 CLINICAL DATA:  Shortness of breath EXAM: CHEST - 2 VIEW COMPARISON:  Aug 06, 2015 FINDINGS: There is apparent loculated pleural effusion on the right. There is patchy airspace opacity in each mid and lower lung zone. Heart is upper normal in size with pulmonary vascularity normal. No adenopathy. There is mild degenerative change in the thoracic spine. IMPRESSION: Patchy airspace opacity in both mid and lower lung zones, felt to represent multifocal pneumonia. Atypical organism pneumonia may well be present given this appearance. Apparent loculated pleural effusion on the right  laterally. Cardiac silhouette within normal limits.  No evident adenopathy. Electronically Signed   By: Lowella Grip III M.D.   On: 04/05/2019 12:06    EKG: Independently reviewed.  Normal sinus rhythm   Assessment/Plan:   Principal Problem:   Pneumonia due to COVID-19 virus Active Problems:   Obesity, Class III, BMI 40-49.9 (morbid obesity) ([place])   Acute hypoxemic respiratory failure (HCC)  BMI (44.1)-morbid obesity  [id number] pneumonia complicated by acute hypoxemic respiratory failure: Admit to telemetry.  Treat with IV dexamethasone and remdesivir.  Continue oxygen via nasal cannula and taper off as able.  Requested transfer to [facility] for further management.  Intermittent asthma: Stable.  Continue bronchodilators.    Other information:   DVT prophylaxis: Lovenox Code Status: Full code. Family Communication: Discussed with patient Disposition Plan: Possible transfer to [place] called: None Admission status: Inpatient  The medical decision making on this patient was of high complexity and the patient is at high risk for clinical deterioration, therefore this is a level 3 visit.    Time spent 70 minutes  [facility]   How to contact the [facility] Attending or Consulting provider [place] or covering provider during after hours [place], for this patient?   1. Check the care team in [facility] and look for a) attending/consulting [facility] provider listed and b) the [facility] team listed 2. Log into [email or web address] and use [place]'s universal password to access. If you do not have the password, please contact the hospital operator. 3. Locate the [facility] provider you are looking for under [facility] and page to a number that you can be directly reached. 4. If you still have difficulty reaching the provider, please page the [facility] (Director on Call) for the Hospitalists listed on amion for assistance.  04/05/2019, 4:53 PM

## 2019-04-05 NOTE — ED Notes (Signed)
Pt updated on plan of care and given ice water as requested.

## 2019-04-05 NOTE — ED Triage Notes (Signed)
Patient presents to the ED from [facility] for hypoxia.  Patient's oxygen level was 79% on room air and went up to 88% on 2L [place].  Patient states he was diagnosed with the flu on Monday.  Patient states he got a covid19 test today but has not yet gotten the results.  Patient is visibly short of breath.

## 2019-04-05 NOTE — ED Notes (Signed)
Attempted to call report to [facility] x2, was told RN would call back.

## 2019-04-05 NOTE — ED Provider Notes (Signed)
[facility] Emergency Department Provider Note ____________________________________________   First MD Initiated Contact with Patient 04/05/19 1315     (approximate)  I have reviewed the triage vital signs and the nursing notes.   HISTORY  Chief Complaint Shortness of Breath    HPI Sean Garza is a 59 y.o. male with PMH as noted below who presents with shortness of breath, gradual onset over the last several days, worsening course, and worse with even minimal exertion.  Is associated with generalized weakness and body aches.  The patient has been feeling sick for about 5 days.  He states that his doctor told him he may have the flu.  He has no known contacts with anyone with COVID-19.  Past Medical History:  Diagnosis Date  . Asthma   . Collagen vascular disease (HCC)    Rhematoid Arthritis  . GERD (gastroesophageal reflux disease)   . Gout   . Hx of blood clots   . Hypertension   . Sleep apnea     Patient Active Problem List   Diagnosis Date Noted  . Swelling   . History of DVT (deep vein thrombosis)   . Family history of coronary artery disease   . Obesity   . Stress at work   . Chest pain 08/06/2015    Past Surgical History:  Procedure Laterality Date  . ACHILLES TENDON REPAIR    . COLONOSCOPY WITH PROPOFOL N/A 12/13/2016   Procedure: COLONOSCOPY WITH PROPOFOL;  Surgeon: Manya Silvas, MD;  Location: [facility] ENDOSCOPY;  Service: Endoscopy;  Laterality: N/A;  . EYE SURGERY     floppy eye syndrome surgery  . SHOULDER ARTHROSCOPY WITH OPEN ROTATOR CUFF REPAIR Left 03/24/2017   Procedure: SHOULDER ARTHROSCOPY WITH OPEN ROTATOR CUFF REPAIR;  Surgeon: Corky Mull, MD;  Location: [facility] ORS;  Service: Orthopedics;  Laterality: Left;  . UVULOPALATOPHARYNGOPLASTY      Prior to Admission medications   Medication Sig Start Date End Date Taking? Authorizing Provider  albuterol (PROVENTIL HFA;VENTOLIN HFA) 108 (90 Base) MCG/ACT inhaler Inhale  2 puffs into the lungs every 6 (six) hours as needed for wheezing or shortness of breath.     [provider]  budesonide-formoterol (SYMBICORT) 160-4.5 MCG/ACT inhaler Inhale 2 puffs into the lungs 2 (two) times daily as needed (for wheezing or shortness of breath.).     [provider]  Multiple Vitamin (MULTIVITAMIN WITH MINERALS) TABS tablet Take 1 tablet by mouth daily.    [provider]  oxyCODONE (ROXICODONE) 5 MG immediate release tablet Take 1-2 tablets (5-10 mg total) by mouth every 4 (four) hours as needed. 03/24/17   Poggi, Marshall Cork, MD  sildenafil (REVATIO) 20 MG tablet Take 20 mg by mouth daily. As needed    [provider]    Allergies Penicillins  Family History  Problem Relation Age of Onset  . CAD Father   . CAD Brother     Social History Social History   Tobacco Use  . Smoking status: Never Smoker  . Smokeless tobacco: Never Used  Substance Use Topics  . Alcohol use: No  . Drug use: No    Review of Systems  Constitutional: Positive for fever. Eyes: No redness. ENT: No sore throat. Cardiovascular: Denies chest pain. Respiratory: Positive for shortness of breath. Gastrointestinal: No vomiting or diarrhea.  Genitourinary: Negative for flank pain.  Musculoskeletal: Negative for back pain. Skin: Negative for rash. Neurological: Negative for headache.   ____________________________________________   PHYSICAL EXAM:  VITAL SIGNS: ED Triage Vitals [04/05/19 1228]  Enc Vitals Group     BP (!) 121/103     Pulse Rate 81     Resp (!) 28     Temp 98.1 F (36.7 C)     Temp Source Oral     SpO2 96 %     Weight      Height      Head Circumference      Peak Flow      Pain Score      Pain Loc      Pain Edu?      Excl. in [place]?     Constitutional: Alert and oriented.  Weak appearing but in no acute distress. Eyes: Conjunctivae are normal.  Head: Atraumatic. Nose: No congestion/rhinnorhea. Mouth/Throat: Mucous  membranes are moist.   Neck: Normal range of motion.  Cardiovascular: Normal rate, regular rhythm.Good peripheral circulation. Respiratory: Tachypnea and increased respiratory effort. Gastrointestinal: No distention.  Musculoskeletal:  Extremities warm and well perfused.  Neurologic:  Normal speech and language. No gross focal neurologic deficits are appreciated.  Skin:  Skin is warm and dry. No rash noted. Psychiatric: Mood and affect are normal. Speech and behavior are normal.  ____________________________________________   LABS (all labs ordered are listed, but only abnormal results are displayed)  Labs Reviewed  COMPREHENSIVE METABOLIC PANEL - Abnormal; Notable for the following components:      Result Value   BUN 24 (*)    Calcium 8.4 (*)    Total Protein 8.6 (*)    Albumin 3.4 (*)    All other components within normal limits  POC SARS CORONAVIRUS 2 AG -  ED - Abnormal; Notable for the following components:   SARS Coronavirus 2 Ag Positive (*)    All other components within normal limits  LACTIC ACID, PLASMA  CBC WITH DIFFERENTIAL/PLATELET  BRAIN NATRIURETIC PEPTIDE  C-REACTIVE PROTEIN  FIBRINOGEN  TRIGLYCERIDES  FIBRIN DERIVATIVES D-DIMER ([facility] ONLY)  LACTIC ACID, PLASMA  FERRITIN  LACTATE DEHYDROGENASE  PROCALCITONIN  TROPONIN I (HIGH SENSITIVITY)  TROPONIN I (HIGH SENSITIVITY)   ____________________________________________  EKG  ED ECG REPORT I, Arta Silence, the attending physician, personally viewed and interpreted this ECG.  Date: 04/05/2019 EKG Time: 1217 Rate: 85 Rhythm: normal sinus rhythm QRS Axis: normal Intervals: normal ST/T Wave abnormalities: normal Narrative Interpretation: no evidence of acute ischemia  ____________________________________________  RADIOLOGY  CXR: Bilateral multifocal infiltrates  ____________________________________________   PROCEDURES  Procedure(s) performed: No  Procedures  Critical Care performed:  Yes  CRITICAL CARE Performed by: Arta Silence   Total critical care time: 30 minutes  Critical care time was exclusive of separately billable procedures and treating other patients.  Critical care was necessary to treat or prevent imminent or life-threatening deterioration.  Critical care was time spent personally by me on the following activities: development of treatment plan with patient and/or surrogate as well as nursing, discussions with consultants, evaluation of patient's response to treatment, examination of patient, obtaining history from patient or surrogate, ordering and performing treatments and interventions, ordering and review of laboratory studies, ordering and review of radiographic studies, pulse oximetry and re-evaluation of patient's condition. ____________________________________________   INITIAL IMPRESSION / ASSESSMENT AND PLAN / ED COURSE  Pertinent labs & imaging results that were available during my care of the patient were reviewed by me and considered in my medical decision making (see chart for details).  59 year old male with PMH as noted above presents with several days of worsening shortness  of breath, body aches, generalized weakness.  He was found to be hypoxic to the high 70s at the outpatient clinic and was sent to the ED.  Patient has no known exposures to COVID-19.  On exam, he has tachypnea and increased work of breathing.  O2 saturation is in the mid 90s on 6 L by nasal cannula.  The remainder of the exam is as described above.  Chest x-ray shows bilateral infiltrates concerning for COVID-19, this is overall the most likely etiology of the patient's presentation.  Differential includes influenza or other viral etiology, or possible bacterial pneumonia.  The patient has no clinical evidence for CHF.  We will obtain a rapid COVID-19 test, lab work-up, and reassess.  ----------------------------------------- 2:44 PM on  04/05/2019 -----------------------------------------  The patient's rapid antigen test is positive.  I have ordered dexamethasone and remdesivir.  We will plan for admission given the hypoxia.  ----------------------------------------- 3:18 PM on 04/05/2019 -----------------------------------------  I discussed the case with the hospitalist for admission.  ___________________________  [name] was evaluated in Emergency Department on 04/05/2019 for the symptoms described in the history of present illness. He was evaluated in the context of the global COVID-19 pandemic, which necessitated consideration that the patient might be at risk for infection with the SARS-CoV-2 virus that causes COVID-19. Institutional protocols and algorithms that pertain to the evaluation of patients at risk for COVID-19 are in a state of rapid change based on information released by regulatory bodies including the CDC and federal and state organizations. These policies and algorithms were followed during the patient's care in the ED. ____________________________________________   FINAL CLINICAL IMPRESSION(S) / ED DIAGNOSES  Final diagnoses:  COVID-19  Acute respiratory failure with hypoxia ([place])      NEW MEDICATIONS STARTED DURING THIS VISIT:  New Prescriptions   No medications on file     Note:  This document was prepared using Dragon voice recognition software and may include unintentional dictation errors.    Arta Silence, MD 04/05/19 [phone]

## 2019-04-05 NOTE — Plan of Care (Signed)
Patient admitted from [facility]. Currently on HFNC at 10L, tolerating well. Plan to continue Remdesivir and Decadron.

## 2019-04-05 NOTE — ED Notes (Signed)
Pt given sandwich tray and ice water. Call bell within reach

## 2019-04-05 NOTE — ED Notes (Signed)
Report given to Saralyn Pilar RN at [facility]

## 2019-04-05 NOTE — Consult Note (Signed)
Remdesivir - Pharmacy Brief Note   O:  ALT: 30 [id number] airspace opacity in both mid and lower lung zones, felt to represent multifocal pneumonia. Atypical organism pneumonia may well be present given this appearance.  SpO2: 96% on 3L   A/P:  Remdesivir 200 mg IVPB once followed by 100 mg IVPB daily x 4 days.   Pearla Dubonnet, PharmD Clinical Pharmacist 04/05/2019 2:36 PM

## 2019-04-05 NOTE — ED Notes (Signed)
Pt verbalizes consent to transfer to [place] at this time.

## 2019-04-05 NOTE — ED Notes (Signed)
Pt to ED reports SHOB that started Sunday. Pt reports his doctor told him on Monday that he had the flu, started taking a zpack with no relief. Reports SHOB with exertion. Pt speaking in short sentences. Dyspnea at rest noted.

## 2019-04-05 NOTE — H&P (Signed)
TRH H&P   Patient Demographics:    Sean Garza, is a 59 y.o. male  MRN: [id number]   DOB - 1960-05-27  Admit Date - 04/05/2019  Outpatient Primary MD for the patient is Albina Billet, MD  Patient coming from: [facility]  No chief complaint on file.     HPI:    Sean Garza  is a 59 y.o. male, with paroxysmal syndrome, RA, GERD, GERD, HTN, OSA, obesity presents as a transfer from [facility] with respiratory failure with hypoxia secondary to SARS-CoV-2.  Patient has had URI symptoms since 1/16, symptoms symptoms included cough which is intermittently productive of yellow sputum.  He has had some blood-streaked sputum as well, however he denies any frank hemoptysis.  He denies any chest pain, palpitations, orthopnea or PND.  He has been having intermittent fevers up to 100F.  He denies any nausea, vomiting, diarrhea or constipation.  He denies any anosmia or ageusia.  No recent travel.  No sick contacts.  He has chronic intermittent lower extremity edema, right greater than left.  He has history of LLE DVT after ankle fracture about 20 years ago.  He was evaluated by his PCP on 1/18, suspected to have influenza or bronchitis and prescribed azithromycin.  He took the medication as prescribed without significant improvement in his symptomatology.  He went to an urgent care 1/20, was found to be hypoxic and referred to the emergency room.     Review of systems:  Review of Systems:  Constitutional: negative for anorexia, chills, fatigue or fevers HEENT: negative for earaches, epistaxis, or sore throat Respiratory: see HPI Cardiovascular: negative for chest pain, palpitations, or syncope GU: negative for dysuria, urinary frequency,  urinary urgency, hematuria Gastrointestinal: negative for abdominal pain, constipation, diarrhea, nausea or vomiting Musculoskeletal: negative for arthralgias, back pain or myalgias Neurological: negative for dizziness, headaches or weakness Behavioral/Psych: negative for suicidal or homicidal ideation Skin:negative for rash Heme: negative for bruises Endo: negative for hair loss, weight gain/loss  With Past History of the following :   Past Medical History:  Diagnosis Date  . Asthma   . Collagen vascular disease (HCC)    Rhematoid Arthritis  . GERD (gastroesophageal reflux disease)   . Gout   . Hx of blood clots   . Hypertension   . Sleep apnea  Past Surgical History:  Procedure Laterality Date  . ACHILLES TENDON REPAIR    . COLONOSCOPY WITH PROPOFOL N/A 12/13/2016   Procedure: COLONOSCOPY WITH PROPOFOL;  Surgeon: Manya Silvas, MD;  Location: [facility] ENDOSCOPY;  Service: Endoscopy;  Laterality: N/A;  . EYE SURGERY     floppy eye syndrome surgery  . SHOULDER ARTHROSCOPY WITH OPEN ROTATOR CUFF REPAIR Left 03/24/2017   Procedure: SHOULDER ARTHROSCOPY WITH OPEN ROTATOR CUFF REPAIR;  Surgeon: Corky Mull, MD;  Location: [facility] ORS;  Service: Orthopedics;  Laterality: Left;  . UVULOPALATOPHARYNGOPLASTY       Social History:    Social History   Tobacco Use  . Smoking status: Never Smoker  . Smokeless tobacco: Never Used  Substance Use Topics  . Alcohol use: No     Family History :    Family History  Problem Relation Age of Onset  . CAD Father   . CAD Brother     Home Medications:   Prior to Admission medications   Medication Sig Start Date End Date Taking? Authorizing Provider  albuterol (PROVENTIL HFA;VENTOLIN HFA) 108 (90 Base) MCG/ACT inhaler Inhale 2 puffs into the lungs every 6 (six) hours as needed for wheezing or shortness of breath.     [provider]  budesonide-formoterol (SYMBICORT) 160-4.5 MCG/ACT inhaler Inhale 2 puffs into the lungs 2  (two) times daily as needed (for wheezing or shortness of breath.).     [provider]  Multiple Vitamin (MULTIVITAMIN WITH MINERALS) TABS tablet Take 1 tablet by mouth daily.    [provider]  sildenafil (REVATIO) 20 MG tablet Take 20 mg by mouth daily. As needed    [provider]     Allergies:     Allergies  Allergen Reactions  . Penicillins Other (See Comments)    Childhood reaction. Has patient had a PCN reaction causing immediate rash, facial/tongue/throat swelling, SOB or lightheadedness with hypotension: Unknown Has patient had a PCN reaction causing severe rash involving mucus membranes or skin necrosis: Unknown Has patient had a PCN reaction that required hospitalization: No Has patient had a PCN reaction occurring within the last 10 years: No If all of the above answers are "NO", then may proceed with Cephalosporin use.      Physical Exam:   Vitals  There were no vitals taken for this visit.  Physical Exam   Constitutional - resting comfortably, no acute distress Eyes - pupils equal round and reactive to light and accomodation, extra ocular movements intact Nose - no gross deformity or drainage Mouth - no oral lesions noted Throat - no swelling or erythema Neck - supple, no JVD   CV - (+)S1S2, no murmurs  Resp -crackles in both lower lung fields,  GI - (+)BS, soft, non-tender, non-distended Extrem - no clubbing, cyanosis, or peripheral edema  Skin - no rashes or wounds Neuro - alert, aware, oriented to person/place/time  Psych - normal affect, no anxiety   Patient has Pressure Ulcer on Admission?: no   Data Review:    CBC Recent Labs  Lab 04/05/19 1230  WBC 8.3  HGB 14.0  HCT 43.5  PLT 237  MCV 87.7  MCH 28.2  MCHC 32.2  RDW 15.3  LYMPHSABS 0.7  MONOABS 0.4  EOSABS 0.0  BASOSABS 0.0   ------------------------------------------------------------------------------------------------------------------  Chemistries    Recent Labs  Lab 04/05/19 1230  NA 135  K 3.7  CL 101  CO2 25  GLUCOSE 94  BUN 24*  CREATININE 1.01  CALCIUM 8.4*  AST 30  ALT 22  ALKPHOS 61  BILITOT 0.9   ------------------------------------------------------------------------------------------------------------------ estimated creatinine clearance is 106.7 mL/min (by C-G formula based on SCr of 1.01 mg/dL). ------------------------------------------------------------------------------------------------------------------ No results for input(s): TSH, T4TOTAL, T3FREE, THYROIDAB in the last 72 hours.  Invalid input(s): FREET3  Coagulation profile No results for input(s): INR, PROTIME in the last 168 hours. ------------------------------------------------------------------------------------------------------------------- No results for input(s): DDIMER in the last 72 hours. -------------------------------------------------------------------------------------------------------------------  Cardiac Enzymes No results for input(s): CKMB, TROPONINI, MYOGLOBIN in the last 168 hours.  Invalid input(s): CK ------------------------------------------------------------------------------------------------------------------    Component Value Date/Time   BNP 32.0 04/05/2019 1515     ---------------------------------------------------------------------------------------------------------------  Urinalysis No results found for: COLORURINE, APPEARANCEUR, LABSPEC, PHURINE, GLUCOSEU, HGBUR, BILIRUBINUR, KETONESUR, PROTEINUR, UROBILINOGEN, NITRITE, LEUKOCYTESUR  ----------------------------------------------------------------------------------------------------------------   Imaging Results:    DG Chest 2 View  Result Date: 04/05/2019 CLINICAL DATA:  Shortness of breath EXAM: CHEST - 2 VIEW COMPARISON:  Aug 06, 2015 FINDINGS: There is apparent loculated pleural effusion on the right. There is patchy airspace opacity in each mid and  lower lung zone. Heart is upper normal in size with pulmonary vascularity normal. No adenopathy. There is mild degenerative change in the thoracic spine. IMPRESSION: Patchy airspace opacity in both mid and lower lung zones, felt to represent multifocal pneumonia. Atypical organism pneumonia may well be present given this appearance. Apparent loculated pleural effusion on the right laterally. Cardiac silhouette within normal limits.  No evident adenopathy. Electronically Signed   By: Lowella Grip III M.D.   On: 04/05/2019 12:06    Assessment & Plan:    Principal Problem:   Acute hypoxemic respiratory failure due to severe acute respiratory syndrome coronavirus 2 (SARS-CoV-2) disease (HCC) Active Problems:   Obesity, Class III, BMI 40-49.9 (morbid obesity) ([place])   Sleep apnea   Asthma     Acute hypoxemic respiratory failure due to SARS-CoV-2 disease: The patient presents as a transfer from [facility] with respiratory failure with hypoxia secondary to SARS-CoV-2.  Admit for supportive care with bronchodilators, antiemetics, antipyretics, antitussives and analgesics as necessary.  Wean off oxygen as tolerated. Date of Dx: 1/21 Oxygen requirements: 5 LPM Antibiotics: Not indicated, WBC and procalcitonin WNL Diuretics: Not indicated, clinically euvolemic Vitamin C and Zinc: Per protocol Remdesivir: Started on 1/21 Steroids: Started on 1/21 Actemra: Not given yet Convalescent Plasma: Not given yet    Obesity, Class III, BMI 40-49.9:Obesity affects all facets of care.  Likely secondary to excessive caloric intake.  Encourage patient to reduce caloric intake while increasing physical activity and engage in other lifestyle changes that may result in weight loss.     Sleep apnea    Asthma: Patient at risk for complications given history of asthma.  No evidence of acute asthma exacerbation at this time.  Continue inhaled steroids and bronchodilators as above.   DVT Prophylaxis Lovenox   AM Labs  Ordered, also please review Full Orders  Family Communication: Admission, patients condition and plan of care including tests being ordered have been discussed with the patient who indicate understanding and agree with the plan and Code Status.  Code Status full code  Likely DC to home  Condition GUARDED    Consults called: None  Admission status: Admit to inpatient  Time spent in minutes : 70   Peyton Bottoms M.D on 04/05/2019 at 9:42 PM  To page go to [email or web address] - password [email or web address]

## 2019-04-06 DIAGNOSIS — J4520 Mild intermittent asthma, uncomplicated: Secondary | ICD-10-CM

## 2019-04-06 LAB — COMPREHENSIVE METABOLIC PANEL
ALT: 24 U/L (ref 0–44)
AST: 28 U/L (ref 15–41)
Albumin: 3.1 g/dL — ABNORMAL LOW (ref 3.5–5.0)
Alkaline Phosphatase: 56 U/L (ref 38–126)
Anion gap: 12 (ref 5–15)
BUN: 25 mg/dL — ABNORMAL HIGH (ref 6–20)
CO2: 24 mmol/L (ref 22–32)
Calcium: 8.8 mg/dL — ABNORMAL LOW (ref 8.9–10.3)
Chloride: 101 mmol/L (ref 98–111)
Creatinine, Ser: 1.03 mg/dL (ref 0.61–1.24)
GFR calc Af Amer: >60 mL/min (ref >60)
GFR calc non Af Amer: >60 mL/min (ref >60)
Glucose, Bld: 115 mg/dL — ABNORMAL HIGH (ref 70–99)
Potassium: 3.9 mmol/L (ref 3.5–5.1)
Sodium: 137 mmol/L (ref 135–145)
Total Bilirubin: 0.5 mg/dL (ref 0.3–1.2)
Total Protein: 8.5 g/dL — ABNORMAL HIGH (ref 6.5–8.1)

## 2019-04-06 LAB — CBC WITH DIFFERENTIAL/PLATELET
Abs Immature Granulocytes: 0.04 10*3/uL (ref 0.00–0.07)
Basophils Absolute: 0 10*3/uL (ref 0.0–0.1)
Basophils Relative: 0 %
Eosinophils Absolute: 0 10*3/uL (ref 0.0–0.5)
Eosinophils Relative: 0 %
HCT: 41.5 % (ref 39.0–52.0)
Hemoglobin: 13.5 g/dL (ref 13.0–17.0)
Immature Granulocytes: 0 %
Lymphocytes Relative: 5 %
Lymphs Abs: 0.4 10*3/uL — ABNORMAL LOW (ref 0.7–4.0)
MCH: 29 pg (ref 26.0–34.0)
MCHC: 32.5 g/dL (ref 30.0–36.0)
MCV: 89.1 fL (ref 80.0–100.0)
Monocytes Absolute: 0.3 10*3/uL (ref 0.1–1.0)
Monocytes Relative: 3 %
Neutro Abs: 8.8 10*3/uL — ABNORMAL HIGH (ref 1.7–7.7)
Neutrophils Relative %: 92 %
Platelets: 277 10*3/uL (ref 150–400)
RBC: 4.66 MIL/uL (ref 4.22–5.81)
RDW: 15.5 % (ref 11.5–15.5)
WBC: 9.6 10*3/uL (ref 4.0–10.5)
nRBC: 0 % (ref 0.0–0.2)

## 2019-04-06 LAB — HIV ANTIBODY (ROUTINE TESTING W REFLEX): HIV Screen 4th Generation wRfx: NONREACTIVE

## 2019-04-06 LAB — D-DIMER, QUANTITATIVE: D-Dimer, Quant: 0.82 ug/mL-FEU — ABNORMAL HIGH (ref 0.00–0.50)

## 2019-04-06 LAB — FERRITIN: Ferritin: 27 ng/mL (ref 24–336)

## 2019-04-06 LAB — C-REACTIVE PROTEIN: CRP: 30.8 mg/dL — ABNORMAL HIGH (ref <1.0)

## 2019-04-06 MED ORDER — ENOXAPARIN SODIUM 80 MG/0.8ML ~~LOC~~ SOLN
65.0000 mg | Freq: Two times a day (BID) | SUBCUTANEOUS | Status: DC
  Administered 2019-04-06 – 2019-04-17 (×23): 65 mg via SUBCUTANEOUS
  Filled 2019-04-06 (×24): qty 0.8

## 2019-04-06 MED ORDER — ALBUTEROL SULFATE HFA 108 (90 BASE) MCG/ACT IN AERS
2.0000 | INHALATION_SPRAY | Freq: Four times a day (QID) | RESPIRATORY_TRACT | Status: DC | PRN
  Administered 2019-04-06 – 2019-04-15 (×8): 2 via RESPIRATORY_TRACT
  Filled 2019-04-06: qty 6.7

## 2019-04-06 MED ORDER — TOCILIZUMAB 400 MG/20ML IV SOLN
800.0000 mg | Freq: Once | INTRAVENOUS | Status: AC
  Administered 2019-04-06: 800 mg via INTRAVENOUS
  Filled 2019-04-06: qty 40

## 2019-04-06 NOTE — Progress Notes (Signed)
PROGRESS NOTE  [name]  [id number] DOB: [birth year] DOA: 04/05/2019 PCP: Albina Billet, MD  Brief Narrative: Sean Garza  is a 59 y.o. male, with paroxysmal syndrome, RA, GERD, GERD, HTN, OSA, obesity presents as a transfer from [facility] with respiratory failure with hypoxia secondary to SARS-CoV-2.  Patient has had URI symptoms since 1/16, symptoms symptoms included cough which is intermittently productive of yellow sputum.  He has had some blood-streaked sputum as well, however he denies any frank hemoptysis.  He denies any chest pain, palpitations, orthopnea or PND.  He has been having intermittent fevers up to 100F.  He denies any nausea, vomiting, diarrhea or constipation.  He denies any anosmia or ageusia.  No recent travel.  No sick contacts.  He has chronic intermittent lower extremity edema, right greater than left.  He has history of LLE DVT after ankle fracture about 20 years ago.  He was evaluated by his PCP on 1/18, suspected to have influenza or bronchitis and prescribed azithromycin.  He took the medication as prescribed without significant improvement in his symptomatology.  He went to an urgent care 1/20, was found to be hypoxic and referred to the emergency room.    Assessment & Plan: Principal Problem:   Acute hypoxemic respiratory failure due to severe acute respiratory syndrome coronavirus 2 (SARS-CoV-2) disease (HCC) Active Problems:   Obesity, Class III, BMI 40-49.9 (morbid obesity) (HCC)   Sleep apnea   Asthma  Acute hypoxemic respiratory failure due to covid-19 pneumonia on chronic asthma without exacerbation: SARS-CoV-2 Ag positive on 1/21, symptoms began 1/16. Troponin, BNP wnl.  - Continue remdesivir x5 days 1/21 - 1/25 - Steroids x10 days - CRP severely elevated >30, PCT 0.10, hypoxemia advancing. Discussed off-label use of tocilizumab with patient at length including risks, benefits, alternatives, and he opts to proceed with single dose now. - Vitamin C, zinc  - Encourage OOB, IS, FV, and awake proning if able - Tylenol and antitussives prn - Continue airborne, contact precautions while admitted. Isolation period would be recommended for 21 days from positive testing. - Check CBC w/diff, CMP, CRP daily - Enoxaparin intermediate dose due to hx DVT - Maintain euvolemia/net negative.  - Avoid NSAIDs  - Dulera (formulary equivalent), combivent, prn albuterol  Right pleural effusion:  - Monitor CXR serially  OSA:  - CPAP qHS if necessary (NOT for respiratory failure indication)  Morbid obesity: BMI 44.85. Worsens risk profile for progression.   DVT prophylaxis: Lovenox 0.5mg /kg q12h Code Status: Full, confirmed patient's desire for intubation if respiratory failure worsens.  Family Communication: None at bedside, pt to relay POC Disposition Plan: Uncertain, guarded prognosis  Consultants:   None  Procedures:   None  Antimicrobials:  Remdesivir   Subjective: Shortness of breath is stable, present at rest, severe with exertion, constant waxing/waning, improved with supplemental oxygen which has titrated up to 15L HFNC this AM. No chest pain or other complaints. Some blood tinged sputum over past several days, none currently.   Objective: Vitals:   04/06/19 0122 04/06/19 0415 04/06/19 0501 04/06/19 0600  BP:  122/72 107/69   Pulse: 74 66 69 67  Resp:   (!) 22   Temp:  98 F (36.7 C) 98.1 F (36.7 C)   TempSrc:  Oral Oral   SpO2: (!) 88% (!) 88% (!) 88% 91%  Weight:      Height:       No intake or output data in the 24 hours ending 04/06/19 [id number] [employer]  04/05/19 2213  Weight: 133.8 kg    Gen: 59 y.o. male in no distress, pleasant Pulm: Non-labored breathing 15L HFNC. Crackles without wheezes bilaterally.  CV: Regular rate and rhythm. No murmur, rub, or gallop. No definite JVD, no pitting pedal edema. GI: Abdomen soft, non-tender, non-distended, with normoactive bowel sounds. No organomegaly or masses felt. Ext:  Warm, no deformities Skin: No rashes, lesions or ulcers Neuro: Alert and oriented. No focal neurological deficits. Psych: Judgement and insight appear normal. Mood & affect appropriate.   Data Reviewed: I have personally reviewed following labs and imaging studies  CBC: Recent Labs  Lab 04/05/19 1230 04/06/19 0006  WBC 8.3 9.6  NEUTROABS 7.1 8.8*  HGB 14.0 13.5  HCT 43.5 41.5  MCV 87.7 89.1  PLT 237 [id number]   Basic Metabolic Panel: Recent Labs  Lab 04/05/19 1230 04/06/19 0006  NA 135 137  K 3.7 3.9  CL 101 101  CO2 25 24  GLUCOSE 94 115*  BUN 24* 25*  CREATININE 1.01 1.03  CALCIUM 8.4* 8.8*   GFR: Estimated Creatinine Clearance: 104.6 mL/min (by C-G formula based on SCr of 1.03 mg/dL). Liver Function Tests: Recent Labs  Lab 04/05/19 1230 04/06/19 0006  AST 30 28  ALT 22 24  ALKPHOS 61 56  BILITOT 0.9 0.5  PROT 8.6* 8.5*  ALBUMIN 3.4* 3.1*   No results for input(s): LIPASE, AMYLASE in the last 168 hours. No results for input(s): AMMONIA in the last 168 hours. Coagulation Profile: No results for input(s): INR, PROTIME in the last 168 hours. Cardiac Enzymes: No results for input(s): CKTOTAL, CKMB, CKMBINDEX, TROPONINI in the last 168 hours. BNP (last 3 results) No results for input(s): PROBNP in the last 8760 hours. HbA1C: No results for input(s): HGBA1C in the last 72 hours. CBG: No results for input(s): GLUCAP in the last 168 hours. Lipid Profile: Recent Labs    04/05/19 1515  TRIG 58   Thyroid Function Tests: No results for input(s): TSH, T4TOTAL, FREET4, T3FREE, THYROIDAB in the last 72 hours. Anemia Panel: Recent Labs    04/05/19 1515 04/06/19 0006  FERRITIN 20* 27   Urine analysis: No results found for: COLORURINE, APPEARANCEUR, LABSPEC, PHURINE, GLUCOSEU, HGBUR, BILIRUBINUR, KETONESUR, PROTEINUR, UROBILINOGEN, NITRITE, LEUKOCYTESUR No results found for this or any previous visit (from the past 240 hour(s)).    Radiology Studies: DG Chest  2 View  Result Date: 04/05/2019 CLINICAL DATA:  Shortness of breath EXAM: CHEST - 2 VIEW COMPARISON:  Aug 06, 2015 FINDINGS: There is apparent loculated pleural effusion on the right. There is patchy airspace opacity in each mid and lower lung zone. Heart is upper normal in size with pulmonary vascularity normal. No adenopathy. There is mild degenerative change in the thoracic spine. IMPRESSION: Patchy airspace opacity in both mid and lower lung zones, felt to represent multifocal pneumonia. Atypical organism pneumonia may well be present given this appearance. Apparent loculated pleural effusion on the right laterally. Cardiac silhouette within normal limits.  No evident adenopathy. Electronically Signed   By: Lowella Grip III M.D.   On: 04/05/2019 12:06    Scheduled Meds: . vitamin C  500 mg Oral Daily  . dexamethasone  6 mg Oral Q24H  . docusate sodium  100 mg Oral Daily  . enoxaparin (LOVENOX) injection  65 mg Subcutaneous QHS  . Ipratropium-Albuterol  1 puff Inhalation Q6H  . mometasone-formoterol  2 puff Inhalation BID  . zinc sulfate  220 mg Oral Daily   Continuous Infusions: . remdesivir  100 mg in NS 100 mL    . tocilizumab (ACTEMRA) - non-COVID treatment       LOS: 1 day   Time spent: 35 minutes.  Patrecia Pour, MD [facility] [email or web address] 04/06/2019, 9:17 AM

## 2019-04-07 LAB — COMPREHENSIVE METABOLIC PANEL
ALT: 23 U/L (ref 0–44)
AST: 31 U/L (ref 15–41)
Albumin: 2.8 g/dL — ABNORMAL LOW (ref 3.5–5.0)
Alkaline Phosphatase: 52 U/L (ref 38–126)
Anion gap: 9 (ref 5–15)
BUN: 32 mg/dL — ABNORMAL HIGH (ref 6–20)
CO2: 26 mmol/L (ref 22–32)
Calcium: 8.8 mg/dL — ABNORMAL LOW (ref 8.9–10.3)
Chloride: 101 mmol/L (ref 98–111)
Creatinine, Ser: 0.98 mg/dL (ref 0.61–1.24)
GFR calc Af Amer: >60 mL/min (ref >60)
GFR calc non Af Amer: >60 mL/min (ref >60)
Glucose, Bld: 125 mg/dL — ABNORMAL HIGH (ref 70–99)
Potassium: 4.4 mmol/L (ref 3.5–5.1)
Sodium: 136 mmol/L (ref 135–145)
Total Bilirubin: 0.5 mg/dL (ref 0.3–1.2)
Total Protein: 8 g/dL (ref 6.5–8.1)

## 2019-04-07 LAB — CBC WITH DIFFERENTIAL/PLATELET
Abs Immature Granulocytes: 0.06 10*3/uL (ref 0.00–0.07)
Basophils Absolute: 0 10*3/uL (ref 0.0–0.1)
Basophils Relative: 0 %
Eosinophils Absolute: 0 10*3/uL (ref 0.0–0.5)
Eosinophils Relative: 0 %
HCT: 41.1 % (ref 39.0–52.0)
Hemoglobin: 13.2 g/dL (ref 13.0–17.0)
Immature Granulocytes: 1 %
Lymphocytes Relative: 5 %
Lymphs Abs: 0.5 10*3/uL — ABNORMAL LOW (ref 0.7–4.0)
MCH: 28.3 pg (ref 26.0–34.0)
MCHC: 32.1 g/dL (ref 30.0–36.0)
MCV: 88 fL (ref 80.0–100.0)
Monocytes Absolute: 0.3 10*3/uL (ref 0.1–1.0)
Monocytes Relative: 2 %
Neutro Abs: 9.9 10*3/uL — ABNORMAL HIGH (ref 1.7–7.7)
Neutrophils Relative %: 92 %
Platelets: 294 10*3/uL (ref 150–400)
RBC: 4.67 MIL/uL (ref 4.22–5.81)
RDW: 15.3 % (ref 11.5–15.5)
WBC: 10.8 10*3/uL — ABNORMAL HIGH (ref 4.0–10.5)
nRBC: 0 % (ref 0.0–0.2)

## 2019-04-07 LAB — FERRITIN: Ferritin: 36 ng/mL (ref 24–336)

## 2019-04-07 LAB — D-DIMER, QUANTITATIVE: D-Dimer, Quant: 0.45 ug/mL-FEU (ref 0.00–0.50)

## 2019-04-07 LAB — C-REACTIVE PROTEIN: CRP: 21.4 mg/dL — ABNORMAL HIGH (ref <1.0)

## 2019-04-07 MED ORDER — PANTOPRAZOLE SODIUM 40 MG PO TBEC
40.0000 mg | DELAYED_RELEASE_TABLET | Freq: Every day | ORAL | Status: DC
  Administered 2019-04-07 – 2019-04-17 (×11): 40 mg via ORAL
  Filled 2019-04-07 (×11): qty 1

## 2019-04-07 MED ORDER — TAMSULOSIN HCL 0.4 MG PO CAPS
0.4000 mg | ORAL_CAPSULE | Freq: Every day | ORAL | Status: DC
  Administered 2019-04-07 – 2019-04-16 (×10): 0.4 mg via ORAL
  Filled 2019-04-07 (×10): qty 1

## 2019-04-07 NOTE — Progress Notes (Signed)
PROGRESS NOTE  Sean Garza  [id number] DOB: 08-08-1960 DOA: 04/05/2019 PCP: Albina Billet, MD  Brief Narrative: Sean Garza  is a 59 y.o. male with a history of RA, DVT, HTN, OSA, and obesity who presented to [facility] ED 1/21 with worsening shortness of breath, symptoms having started 1/16. In the ED he was tachypneic and hypoxic down to 79% on room air. SARS-CoV-2 antigen was positive, CXR revealed bilateral infiltrates. Remdesivir and steroids were started and the patient admitted to [facility]. Inflammatory markers were severely elevated and hypoxemia was worsening, so tocilizumab was administered 1/22. Oxygen has been weaned to 8L HFNC and he remains clinically tenuous.  Assessment & Plan: Principal Problem:   Acute hypoxemic respiratory failure due to severe acute respiratory syndrome coronavirus 2 (SARS-CoV-2) disease (HCC) Active Problems:   Obesity, Class III, BMI 40-49.9 (morbid obesity) (HCC)   Sleep apnea   Asthma  Acute hypoxemic respiratory failure due to covid-19 pneumonia on chronic asthma without exacerbation: SARS-CoV-2 Ag positive on 1/21, symptoms began 1/16. Troponin, BNP wnl.  - Continue remdesivir x5 days 1/21 - 1/25. LFTs stable, wnl. - Steroids x10 days - s/p tocilizumab 1/22. CRP declining but remains significantly elevated. - Vitamin C, zinc - Encourage OOB, IS, FV, and awake proning if able - Tylenol and antitussives prn - Continue airborne, contact precautions while admitted. Isolation period would be recommended for 21 days from positive testing. - Check CBC w/diff, CMP, CRP daily - Enoxaparin intermediate dose due to hx DVT. D-dimer within normal limits.  - Maintain euvolemia/net negative.  - Avoid NSAIDs  - Dulera (formulary equivalent), combivent, prn albuterol  Right pleural effusion:  - Monitor CXR serially, check again in AM  OSA:  - CPAP qHS if necessary (NOT for respiratory failure indication)  Morbid obesity: BMI 44.85. Worsens risk profile for  progression.   Urinary hesitancy: Bearing down causing worsening tachypnea/hypoxia. - UA, check PVR - Trial flomax. Pt is not hypotensive, has no listed history of cataracts or glaucoma, and had MR prostate showing no high-grade carcinoma in the peripheral zone (PI-RADS: 1) and enlarged transitional zone most consistent benign prostate hypertrophy (PI-RADS: 1).  DVT prophylaxis: Lovenox 0.5mg /kg q12h Code Status: Full, confirmed patient's desire for intubation if respiratory failure worsens.  Family Communication: None at bedside, pt to relay POC Disposition Plan: Uncertain, guarded prognosis  Consultants:   None  Procedures:   None  Antimicrobials:  Remdesivir   Subjective: Weaned to 8L HFNC yesterday, still extremely short of breath with any exertion which is constant, slowly improved with rest, no chest pain or leg swelling/tenderness  Objective: Vitals:   04/07/19 0900 04/07/19 1059 04/07/19 1200 04/07/19 1524  BP:   121/72 115/67  Pulse:  68 71 67  Resp: (!) 24     Temp:   97.7 F (36.5 C) 97.8 F (36.6 C)  TempSrc:   Oral Oral  SpO2:  96% 92% 93%  Weight:      Height:        Intake/Output Summary (Last 24 hours) at 04/07/2019 1602 Last data filed at 04/07/2019 1500 Gross per 24 hour  Intake 1830 ml  Output 2000 ml  Net -170 ml   Filed Weights   04/05/19 2213  Weight: 133.8 kg   Gen: 59 y.o. male in no distress Pulm: Nonlabored but tachypneic breathing HFNC. Crackles stable. CV: Regular rate and rhythm. No murmur, rub, or gallop. No JVD, no dependent edema. GI: Abdomen soft, non-tender, non-distended, with normoactive bowel sounds.  Ext:  Warm, no deformities Skin: No rashes, lesions or ulcers on visualized skin. Neuro: Alert and oriented. No focal neurological deficits. Psych: Judgement and insight appear fair. Mood euthymic & affect congruent. Behavior is appropriate.    Data Reviewed: I have personally reviewed following labs and imaging  studies  CBC: Recent Labs  Lab 04/05/19 1230 04/06/19 0006 04/07/19 0009  WBC 8.3 9.6 10.8*  NEUTROABS 7.1 8.8* 9.9*  HGB 14.0 13.5 13.2  HCT 43.5 41.5 41.1  MCV 87.7 89.1 88.0  PLT 237 277 [id number]   Basic Metabolic Panel: Recent Labs  Lab 04/05/19 1230 04/06/19 0006 04/07/19 0009  NA 135 137 136  K 3.7 3.9 4.4  CL 101 101 101  CO2 25 24 26   GLUCOSE 94 115* 125*  BUN 24* 25* 32*  CREATININE 1.01 1.03 0.98  CALCIUM 8.4* 8.8* 8.8*   GFR: Estimated Creatinine Clearance: 109.9 mL/min (by C-G formula based on SCr of 0.98 mg/dL). Liver Function Tests: Recent Labs  Lab 04/05/19 1230 04/06/19 0006 04/07/19 0009  AST 30 28 31   ALT 22 24 23   ALKPHOS 61 56 52  BILITOT 0.9 0.5 0.5  PROT 8.6* 8.5* 8.0  ALBUMIN 3.4* 3.1* 2.8*   No results for input(s): LIPASE, AMYLASE in the last 168 hours. No results for input(s): AMMONIA in the last 168 hours. Coagulation Profile: No results for input(s): INR, PROTIME in the last 168 hours. Cardiac Enzymes: No results for input(s): CKTOTAL, CKMB, CKMBINDEX, TROPONINI in the last 168 hours. BNP (last 3 results) No results for input(s): PROBNP in the last 8760 hours. HbA1C: No results for input(s): HGBA1C in the last 72 hours. CBG: No results for input(s): GLUCAP in the last 168 hours. Lipid Profile: Recent Labs    04/05/19 1515  TRIG 58   Thyroid Function Tests: No results for input(s): TSH, T4TOTAL, FREET4, T3FREE, THYROIDAB in the last 72 hours. Anemia Panel: Recent Labs    04/06/19 0006 04/07/19 0009  FERRITIN 27 36   Urine analysis: No results found for: COLORURINE, APPEARANCEUR, LABSPEC, PHURINE, GLUCOSEU, HGBUR, BILIRUBINUR, KETONESUR, PROTEINUR, UROBILINOGEN, NITRITE, LEUKOCYTESUR No results found for this or any previous visit (from the past 240 hour(s)).    Radiology Studies: No results found.  Scheduled Meds: . vitamin C  500 mg Oral Daily  . dexamethasone  6 mg Oral Q24H  . docusate sodium  100 mg Oral Daily   . enoxaparin (LOVENOX) injection  65 mg Subcutaneous Q12H  . Ipratropium-Albuterol  1 puff Inhalation Q6H  . mometasone-formoterol  2 puff Inhalation BID  . pantoprazole  40 mg Oral Daily  . zinc sulfate  220 mg Oral Daily   Continuous Infusions: . remdesivir 100 mg in NS 100 mL 100 mg (04/07/19 0908)     LOS: 2 days   Time spent: 35 minutes.  Patrecia Pour, MD [facility] [email or web address] 04/07/2019, 4:02 PM

## 2019-04-08 ENCOUNTER — Inpatient Hospital Stay (HOSPITAL_COMMUNITY): Payer: BC Managed Care – PPO

## 2019-04-08 LAB — CBC WITH DIFFERENTIAL/PLATELET
Abs Immature Granulocytes: 0.26 10*3/uL — ABNORMAL HIGH (ref 0.00–0.07)
Basophils Absolute: 0 10*3/uL (ref 0.0–0.1)
Basophils Relative: 0 %
Eosinophils Absolute: 0 10*3/uL (ref 0.0–0.5)
Eosinophils Relative: 0 %
HCT: 41.6 % (ref 39.0–52.0)
Hemoglobin: 13.5 g/dL (ref 13.0–17.0)
Immature Granulocytes: 2 %
Lymphocytes Relative: 6 %
Lymphs Abs: 0.9 10*3/uL (ref 0.7–4.0)
MCH: 28.7 pg (ref 26.0–34.0)
MCHC: 32.5 g/dL (ref 30.0–36.0)
MCV: 88.3 fL (ref 80.0–100.0)
Monocytes Absolute: 0.5 10*3/uL (ref 0.1–1.0)
Monocytes Relative: 3 %
Neutro Abs: 12.5 10*3/uL — ABNORMAL HIGH (ref 1.7–7.7)
Neutrophils Relative %: 89 %
Platelets: 324 10*3/uL (ref 150–400)
RBC: 4.71 MIL/uL (ref 4.22–5.81)
RDW: 15 % (ref 11.5–15.5)
WBC: 14.1 10*3/uL — ABNORMAL HIGH (ref 4.0–10.5)
nRBC: 0 % (ref 0.0–0.2)

## 2019-04-08 LAB — COMPREHENSIVE METABOLIC PANEL
ALT: 23 U/L (ref 0–44)
AST: 24 U/L (ref 15–41)
Albumin: 2.8 g/dL — ABNORMAL LOW (ref 3.5–5.0)
Alkaline Phosphatase: 52 U/L (ref 38–126)
Anion gap: 9 (ref 5–15)
BUN: 38 mg/dL — ABNORMAL HIGH (ref 6–20)
CO2: 24 mmol/L (ref 22–32)
Calcium: 8.7 mg/dL — ABNORMAL LOW (ref 8.9–10.3)
Chloride: 103 mmol/L (ref 98–111)
Creatinine, Ser: 0.91 mg/dL (ref 0.61–1.24)
GFR calc Af Amer: >60 mL/min (ref >60)
GFR calc non Af Amer: >60 mL/min (ref >60)
Glucose, Bld: 121 mg/dL — ABNORMAL HIGH (ref 70–99)
Potassium: 4.7 mmol/L (ref 3.5–5.1)
Sodium: 136 mmol/L (ref 135–145)
Total Bilirubin: 0.5 mg/dL (ref 0.3–1.2)
Total Protein: 7.6 g/dL (ref 6.5–8.1)

## 2019-04-08 LAB — C-REACTIVE PROTEIN: CRP: 9.4 mg/dL — ABNORMAL HIGH (ref <1.0)

## 2019-04-08 NOTE — Progress Notes (Signed)
PROGRESS NOTE  Sean Garza  [id number] DOB: 03-19-1960 DOA: 04/05/2019 PCP: Albina Billet, MD  Brief Narrative: Sean Garza  is a 59 y.o. male with a history of RA, DVT, HTN, OSA, and obesity who presented to [facility] ED 1/21 with worsening shortness of breath, symptoms having started 1/16. In the ED he was tachypneic and hypoxic down to 79% on room air. SARS-CoV-2 antigen was positive, CXR revealed bilateral infiltrates. Remdesivir and steroids were started and the patient admitted to [facility]. Inflammatory markers were severely elevated and hypoxemia was worsening, so tocilizumab was administered 1/22. Oxygen has been weaned to 8L HFNC and CXR demonstrates improved aeration with continued infiltrates.  Assessment & Plan: Principal Problem:   Acute hypoxemic respiratory failure due to severe acute respiratory syndrome coronavirus 2 (SARS-CoV-2) disease (HCC) Active Problems:   Obesity, Class III, BMI 40-49.9 (morbid obesity) (HCC)   Sleep apnea   Asthma  Acute hypoxemic respiratory failure due to covid-19 pneumonia on chronic asthma without exacerbation: SARS-CoV-2 Ag positive on 1/21, symptoms began 1/16. Troponin, BNP wnl.  - Continue remdesivir x5 days 1/21 - 1/25. LFTs stable, wnl. - Steroids x10 days - s/p tocilizumab 1/22. CRP declining. - Vitamin C, zinc - Encourage OOB, IS, FV, and awake proning if able - Tylenol and antitussives prn - Continue airborne, contact precautions while admitted. Isolation period would be recommended for 21 days from positive testing. - Enoxaparin intermediate dose due to hx DVT. D-dimer within normal limits.  - Maintain euvolemia/net negative.  - Avoid NSAIDs  - Dulera (formulary equivalent), combivent, prn albuterol  Right pleural effusion:  - Resolved on CXR 1/24 on my personal review this morning.  OSA:  - CPAP qHS if necessary (NOT for respiratory failure indication)  Morbid obesity: BMI 44.85. Worsens risk profile for progression.    Urinary hesitancy: Bearing down causing worsening tachypnea/hypoxia. - Flomax seems to have improved symptoms significantly, will continue this. Pt is not hypotensive, has no listed history of cataracts or glaucoma, and had MR prostate showing no high-grade carcinoma in the peripheral zone (PI-RADS: 1) and enlarged transitional zone most consistent benign prostate hypertrophy (PI-RADS: 1).  DVT prophylaxis: Lovenox 0.5mg /kg q12h Code Status: Full, confirmed patient's desire for intubation if respiratory failure worsens.  Family Communication: None at bedside, pt to relay POC Disposition Plan: Uncertain, guarded prognosis, pending improvement in hypoxemia.  Consultants:   None  Procedures:   None  Antimicrobials:  Remdesivir   Subjective: Stable on 8L HFNC, very short of breath with exertion, essentially unchanged. At rest he's a bit more comfortable but cough is severe. No current chest pain.   Objective: Vitals:   04/07/19 1920 04/08/19 0359 04/08/19 0731 04/08/19 1154  BP: 123/79 136/84  (!) 106/59  Pulse: 68 61    Resp: 20 18 18 16   Temp: 97.8 F (36.6 C) 98.1 F (36.7 C) 97.9 F (36.6 C) 98.5 F (36.9 C)  TempSrc: Oral Oral Oral Oral  SpO2: 91% 94% 90%   Weight:      Height:        Intake/Output Summary (Last 24 hours) at 04/08/2019 1602 Last data filed at 04/08/2019 0900 Gross per 24 hour  Intake 740 ml  Output 1450 ml  Net -710 ml   Filed Weights   04/05/19 2213  Weight: 133.8 kg   Gen: 59 y.o. male in no distress Pulm: Nonlabored with normal rate, Crackles diffusely are clearing. CV: Regular rate and rhythm. No murmur, rub, or gallop. No JVD, no dependent  edema. GI: Abdomen soft, non-tender, non-distended, with normoactive bowel sounds.  Ext: Warm, no deformities Skin: No rashes, lesions or ulcers on visualized skin. Neuro: Alert and oriented. No focal neurological deficits. Psych: Judgement and insight appear fair. Mood euthymic & affect congruent.  Behavior is appropriate.    Data Reviewed: I have personally reviewed following labs and imaging studies  CBC: Recent Labs  Lab 04/05/19 1230 04/06/19 0006 04/07/19 0009 04/08/19 0610  WBC 8.3 9.6 10.8* 14.1*  NEUTROABS 7.1 8.8* 9.9* 12.5*  HGB 14.0 13.5 13.2 13.5  HCT 43.5 41.5 41.1 41.6  MCV 87.7 89.1 88.0 88.3  PLT 237 277 294 0000000   Basic Metabolic Panel: Recent Labs  Lab 04/05/19 1230 04/06/19 0006 04/07/19 0009 04/08/19 0610  NA 135 137 136 136  K 3.7 3.9 4.4 4.7  CL 101 101 101 103  CO2 25 24 26 24   GLUCOSE 94 115* 125* 121*  BUN 24* 25* 32* 38*  CREATININE 1.01 1.03 0.98 0.91  CALCIUM 8.4* 8.8* 8.8* 8.7*   GFR: Estimated Creatinine Clearance: 118.4 mL/min (by C-G formula based on SCr of 0.91 mg/dL). Liver Function Tests: Recent Labs  Lab 04/05/19 1230 04/06/19 0006 04/07/19 0009 04/08/19 0610  AST 30 28 31 24   ALT 22 24 23 23   ALKPHOS 61 56 52 52  BILITOT 0.9 0.5 0.5 0.5  PROT 8.6* 8.5* 8.0 7.6  ALBUMIN 3.4* 3.1* 2.8* 2.8*   No results for input(s): LIPASE, AMYLASE in the last 168 hours. No results for input(s): AMMONIA in the last 168 hours. Coagulation Profile: No results for input(s): INR, PROTIME in the last 168 hours. Cardiac Enzymes: No results for input(s): CKTOTAL, CKMB, CKMBINDEX, TROPONINI in the last 168 hours. BNP (last 3 results) No results for input(s): PROBNP in the last 8760 hours. HbA1C: No results for input(s): HGBA1C in the last 72 hours. CBG: No results for input(s): GLUCAP in the last 168 hours. Lipid Profile: No results for input(s): CHOL, HDL, LDLCALC, TRIG, CHOLHDL, LDLDIRECT in the last 72 hours. Thyroid Function Tests: No results for input(s): TSH, T4TOTAL, FREET4, T3FREE, THYROIDAB in the last 72 hours. Anemia Panel: Recent Labs    04/06/19 0006 04/07/19 0009  FERRITIN 27 36   Urine analysis: No results found for: COLORURINE, APPEARANCEUR, LABSPEC, PHURINE, GLUCOSEU, HGBUR, BILIRUBINUR, KETONESUR, PROTEINUR,  UROBILINOGEN, NITRITE, LEUKOCYTESUR No results found for this or any previous visit (from the past 240 hour(s)).    Radiology Studies: DG CHEST PORT 1 VIEW  Result Date: 04/08/2019 CLINICAL DATA:  Acute respiratory failure. COVID-19 positive. Pleural effusion. EXAM: PORTABLE CHEST 1 VIEW COMPARISON:  04/05/2019 FINDINGS: Improved lung volumes. Diffuse bilateral airspace disease with interval improvement. No pleural effusion. No pneumothorax IMPRESSION: Improved lung volumes.  Improvement in bilateral airspace disease. Electronically Signed   By: Franchot Gallo M.D.   On: 04/08/2019 08:44    Scheduled Meds: . vitamin C  500 mg Oral Daily  . dexamethasone  6 mg Oral Q24H  . docusate sodium  100 mg Oral Daily  . enoxaparin (LOVENOX) injection  65 mg Subcutaneous Q12H  . Ipratropium-Albuterol  1 puff Inhalation Q6H  . mometasone-formoterol  2 puff Inhalation BID  . pantoprazole  40 mg Oral Daily  . tamsulosin  0.4 mg Oral QPC supper  . zinc sulfate  220 mg Oral Daily   Continuous Infusions: . remdesivir 100 mg in NS 100 mL 100 mg (04/08/19 0902)     LOS: 3 days   Time spent: 35 minutes.  Trine Fread  Jeannette How, MD [facility] [email or web address] 04/08/2019, 4:02 PM

## 2019-04-08 NOTE — Progress Notes (Signed)
41- Family called. Updates given by RN

## 2019-04-09 LAB — COMPREHENSIVE METABOLIC PANEL
ALT: 23 U/L (ref 0–44)
AST: 23 U/L (ref 15–41)
Albumin: 2.9 g/dL — ABNORMAL LOW (ref 3.5–5.0)
Alkaline Phosphatase: 51 U/L (ref 38–126)
Anion gap: 9 (ref 5–15)
BUN: 39 mg/dL — ABNORMAL HIGH (ref 6–20)
CO2: 24 mmol/L (ref 22–32)
Calcium: 8.5 mg/dL — ABNORMAL LOW (ref 8.9–10.3)
Chloride: 103 mmol/L (ref 98–111)
Creatinine, Ser: 1.18 mg/dL (ref 0.61–1.24)
GFR calc Af Amer: >60 mL/min (ref >60)
GFR calc non Af Amer: >60 mL/min (ref >60)
Glucose, Bld: 137 mg/dL — ABNORMAL HIGH (ref 70–99)
Potassium: 5 mmol/L (ref 3.5–5.1)
Sodium: 136 mmol/L (ref 135–145)
Total Bilirubin: 0.6 mg/dL (ref 0.3–1.2)
Total Protein: 7.2 g/dL (ref 6.5–8.1)

## 2019-04-09 LAB — URINALYSIS, ROUTINE W REFLEX MICROSCOPIC
Bilirubin Urine: NEGATIVE
Glucose, UA: NEGATIVE mg/dL
Hgb urine dipstick: NEGATIVE
Ketones, ur: NEGATIVE mg/dL
Leukocytes,Ua: NEGATIVE
Nitrite: NEGATIVE
Protein, ur: NEGATIVE mg/dL
Specific Gravity, Urine: 1.021 (ref 1.005–1.030)
pH: 6 (ref 5.0–8.0)

## 2019-04-09 LAB — C-REACTIVE PROTEIN: CRP: 5 mg/dL — ABNORMAL HIGH (ref <1.0)

## 2019-04-09 MED ORDER — MENTHOL 3 MG MT LOZG
1.0000 | LOZENGE | OROMUCOSAL | Status: DC | PRN
  Filled 2019-04-09: qty 9

## 2019-04-09 MED ORDER — BENZONATATE 100 MG PO CAPS
200.0000 mg | ORAL_CAPSULE | Freq: Three times a day (TID) | ORAL | Status: DC
  Administered 2019-04-09 – 2019-04-17 (×25): 200 mg via ORAL
  Filled 2019-04-09 (×26): qty 2

## 2019-04-09 NOTE — Progress Notes (Signed)
PROGRESS NOTE  Sean Garza  [id number] DOB: Feb 18, 1961 DOA: 04/05/2019 PCP: Albina Billet, MD  Brief Narrative: Sean Garza  is a 59 y.o. male with a history of RA, DVT, HTN, OSA, and obesity who presented to [facility] ED 1/21 with worsening shortness of breath, symptoms having started 1/16. In the ED he was tachypneic and hypoxic down to 79% on room air. SARS-CoV-2 antigen was positive, CXR revealed bilateral infiltrates. Remdesivir and steroids were started and the patient admitted to [facility]. Inflammatory markers were severely elevated and hypoxemia was worsening, so tocilizumab was administered 1/22. Oxygen has been weaned to 8L HFNC and CXR demonstrates improved aeration with continued infiltrates.   Assessment & Plan: Principal Problem:   Acute hypoxemic respiratory failure due to severe acute respiratory syndrome coronavirus 2 (SARS-CoV-2) disease (HCC) Active Problems:   Obesity, Class III, BMI 40-49.9 (morbid obesity) (HCC)   Sleep apnea   Asthma  Acute hypoxemic respiratory failure due to covid-19 pneumonia on chronic asthma without exacerbation: SARS-CoV-2 Ag positive on 1/21, symptoms began 1/16. Troponin, BNP wnl.  - Continue remdesivir x5 days 1/21 - 1/25. LFTs stable, wnl. - Steroids x10 days - s/p tocilizumab 1/22. CRP declining. - Vitamin C, zinc - Encourage OOB, IS, FV, and awake proning if able - Tylenol  prn - Continue airborne, contact precautions while admitted. Isolation period would be recommended for 21 days from positive testing. - Enoxaparin intermediate dose due to hx DVT. D-dimer within normal limits.  - Maintain euvolemia/net negative.  - Avoid NSAIDs  - Dulera (formulary equivalent), combivent, prn albuterol - Continue maximal inhalers for wheezing, steroids.  - Augment antitussives to include prn lozenge and scheduled tessalon TID.  Right pleural effusion:  - Resolved on CXR 1/24   OSA:  - CPAP qHS if necessary (NOT for respiratory failure indication)   Morbid obesity: BMI 44.85. Worsens risk profile for progression.   Urinary hesitancy: Bearing down causing worsening tachypnea/hypoxia. - Flomax seems to have improved symptoms significantly, will continue this. Recommend outpatient urology follow up. MR prostate showing no high-grade carcinoma in the peripheral zone (PI-RADS: 1) and enlarged transitional zone most consistent benign prostate hypertrophy (PI-RADS: 1).  DVT prophylaxis: Lovenox 0.5mg /kg q12h Code Status: Full  Family Communication: None at bedside, pt to relay POC Disposition Plan: Uncertain, guarded prognosis, pending improvement in hypoxemia.  Consultants:   None  Procedures:   None  Antimicrobials:  Remdesivir 1/21 - 1/25  Subjective: Continues to feel unwell, short of breath despite 8L O2 and adequate saturations. Coughing is persistent, severe, associated with chest pain which is absent otherwise. Inhaler improves this/wheezing.   Objective: Vitals:   04/08/19 1631 04/08/19 1948 04/09/19 0338 04/09/19 0721  BP: 117/62 137/81 121/77 111/64  Pulse:  (!) 59 (!) 57   Resp: 18 18 20 20   Temp: 98.5 F (36.9 C) 98.8 F (37.1 C) 98.1 F (36.7 C) (!) 97.5 F (36.4 C)  TempSrc: Oral Oral Oral Oral  SpO2: (!) 86% 91% 91%   Weight:      Height:        Intake/Output Summary (Last 24 hours) at 04/09/2019 1000 Last data filed at 04/09/2019 0700 Gross per 24 hour  Intake 760 ml  Output 1500 ml  Net -740 ml   Filed Weights   04/05/19 2213  Weight: 133.8 kg   Gen: 59 y.o. male in no distress Pulm: Nonlabored breathing 8L HFNC. Clearing crackles, still at bases with wheeze noted on coughing spells. CV: Regular rate and rhythm.  No murmur, rub, or gallop. No JVD, no dependent edema. GI: Abdomen soft, non-tender, non-distended, with normoactive bowel sounds.  Ext: Warm, no deformities Skin: No rashes, lesions or ulcers on visualized skin. Neuro: Alert and oriented. No focal neurological deficits. Psych:  Judgement and insight appear fair. Mood euthymic & affect congruent. Behavior is appropriate.    Data Reviewed: I have personally reviewed following labs and imaging studies  CBC: Recent Labs  Lab 04/05/19 1230 04/06/19 0006 04/07/19 0009 04/08/19 0610  WBC 8.3 9.6 10.8* 14.1*  NEUTROABS 7.1 8.8* 9.9* 12.5*  HGB 14.0 13.5 13.2 13.5  HCT 43.5 41.5 41.1 41.6  MCV 87.7 89.1 88.0 88.3  PLT 237 277 294 0000000   Basic Metabolic Panel: Recent Labs  Lab 04/05/19 1230 04/06/19 0006 04/07/19 0009 04/08/19 0610 04/09/19 0305  NA 135 137 136 136 136  K 3.7 3.9 4.4 4.7 5.0  CL 101 101 101 103 103  CO2 25 24 26 24 24   GLUCOSE 94 115* 125* 121* 137*  BUN 24* 25* 32* 38* 39*  CREATININE 1.01 1.03 0.98 0.91 1.18  CALCIUM 8.4* 8.8* 8.8* 8.7* 8.5*   GFR: Estimated Creatinine Clearance: 91.3 mL/min (by C-G formula based on SCr of 1.18 mg/dL). Liver Function Tests: Recent Labs  Lab 04/05/19 1230 04/06/19 0006 04/07/19 0009 04/08/19 0610 04/09/19 0305  AST 30 28 31 24 23   ALT 22 24 23 23 23   ALKPHOS 61 56 52 52 51  BILITOT 0.9 0.5 0.5 0.5 0.6  PROT 8.6* 8.5* 8.0 7.6 7.2  ALBUMIN 3.4* 3.1* 2.8* 2.8* 2.9*   No results for input(s): LIPASE, AMYLASE in the last 168 hours. No results for input(s): AMMONIA in the last 168 hours. Coagulation Profile: No results for input(s): INR, PROTIME in the last 168 hours. Cardiac Enzymes: No results for input(s): CKTOTAL, CKMB, CKMBINDEX, TROPONINI in the last 168 hours. BNP (last 3 results) No results for input(s): PROBNP in the last 8760 hours. HbA1C: No results for input(s): HGBA1C in the last 72 hours. CBG: No results for input(s): GLUCAP in the last 168 hours. Lipid Profile: No results for input(s): CHOL, HDL, LDLCALC, TRIG, CHOLHDL, LDLDIRECT in the last 72 hours. Thyroid Function Tests: No results for input(s): TSH, T4TOTAL, FREET4, T3FREE, THYROIDAB in the last 72 hours. Anemia Panel: Recent Labs    04/07/19 0009  FERRITIN 36    Urine analysis:    Component Value Date/Time   COLORURINE YELLOW 04/08/2019 1905   APPEARANCEUR CLEAR 04/08/2019 1905   LABSPEC 1.021 04/08/2019 1905   PHURINE 6.0 04/08/2019 1905   GLUCOSEU NEGATIVE 04/08/2019 [place] NEGATIVE 04/08/2019 1905   BILIRUBINUR NEGATIVE 04/08/2019 [place] NEGATIVE 04/08/2019 1905   PROTEINUR NEGATIVE 04/08/2019 1905   NITRITE NEGATIVE 04/08/2019 1905   LEUKOCYTESUR NEGATIVE 04/08/2019 1905   No results found for this or any previous visit (from the past 240 hour(s)).    Radiology Studies: DG CHEST PORT 1 VIEW  Result Date: 04/08/2019 CLINICAL DATA:  Acute respiratory failure. COVID-19 positive. Pleural effusion. EXAM: PORTABLE CHEST 1 VIEW COMPARISON:  04/05/2019 FINDINGS: Improved lung volumes. Diffuse bilateral airspace disease with interval improvement. No pleural effusion. No pneumothorax IMPRESSION: Improved lung volumes.  Improvement in bilateral airspace disease. Electronically Signed   By: Franchot Gallo M.D.   On: 04/08/2019 08:44    Scheduled Meds: . vitamin C  500 mg Oral Daily  . benzonatate  200 mg Oral TID  . dexamethasone  6 mg Oral Q24H  .  docusate sodium  100 mg Oral Daily  . enoxaparin (LOVENOX) injection  65 mg Subcutaneous Q12H  . Ipratropium-Albuterol  1 puff Inhalation Q6H  . mometasone-formoterol  2 puff Inhalation BID  . pantoprazole  40 mg Oral Daily  . tamsulosin  0.4 mg Oral QPC supper  . zinc sulfate  220 mg Oral Daily   Continuous Infusions:    LOS: 4 days   Time spent: 35 minutes.  Patrecia Pour, MD [facility] [email or web address] 04/09/2019, 10:00 AM

## 2019-04-09 NOTE — Plan of Care (Signed)

## 2019-04-10 LAB — COMPREHENSIVE METABOLIC PANEL
ALT: 29 U/L (ref 0–44)
AST: 22 U/L (ref 15–41)
Albumin: 2.7 g/dL — ABNORMAL LOW (ref 3.5–5.0)
Alkaline Phosphatase: 51 U/L (ref 38–126)
Anion gap: 8 (ref 5–15)
BUN: 35 mg/dL — ABNORMAL HIGH (ref 6–20)
CO2: 25 mmol/L (ref 22–32)
Calcium: 8.4 mg/dL — ABNORMAL LOW (ref 8.9–10.3)
Chloride: 103 mmol/L (ref 98–111)
Creatinine, Ser: 1.02 mg/dL (ref 0.61–1.24)
GFR calc Af Amer: >60 mL/min (ref >60)
GFR calc non Af Amer: >60 mL/min (ref >60)
Glucose, Bld: 145 mg/dL — ABNORMAL HIGH (ref 70–99)
Potassium: 4.8 mmol/L (ref 3.5–5.1)
Sodium: 136 mmol/L (ref 135–145)
Total Bilirubin: 0.6 mg/dL (ref 0.3–1.2)
Total Protein: 6.9 g/dL (ref 6.5–8.1)

## 2019-04-10 LAB — C-REACTIVE PROTEIN: CRP: 2.2 mg/dL — ABNORMAL HIGH (ref <1.0)

## 2019-04-10 NOTE — Plan of Care (Signed)

## 2019-04-10 NOTE — Progress Notes (Signed)
PROGRESS NOTE  Sean Garza  [id number] DOB: 18-Dec-1960 DOA: 04/05/2019 PCP: Albina Billet, MD  Brief Narrative: Sean Garza  is a 59 y.o. male with a history of RA, DVT, HTN, OSA, and obesity who presented to [facility] ED 1/21 with worsening shortness of breath, symptoms having started 1/16. In the ED he was tachypneic and hypoxic down to 79% on room air. SARS-CoV-2 antigen was positive, CXR revealed bilateral infiltrates. Remdesivir and steroids were started and the patient admitted to [facility]. Inflammatory markers were severely elevated and hypoxemia was worsening, so tocilizumab was administered 1/22. Oxygen has been weaned to 8L HFNC and CXR demonstrates improved aeration with continued infiltrates.   Assessment & Plan: Principal Problem:   Acute hypoxemic respiratory failure due to severe acute respiratory syndrome coronavirus 2 (SARS-CoV-2) disease (HCC) Active Problems:   Obesity, Class III, BMI 40-49.9 (morbid obesity) (HCC)   Sleep apnea   Asthma  Acute hypoxemic respiratory failure due to covid-19 pneumonia on chronic asthma without exacerbation: SARS-CoV-2 Ag positive on 1/21, symptoms began 1/16. Troponin, BNP wnl.  - Completed remdesivir x5 days 1/21 - 1/25. LFTs stable, wnl. - Steroids x10 days - s/p tocilizumab 1/22. CRP declining.  - Vitamin C, zinc - Encourage OOB, IS, FV, and awake proning if able - Tylenol  prn - Continue airborne, contact precautions while admitted. Isolation period would be recommended for 21 days from positive testing. - Enoxaparin intermediate dose due to hx DVT. D-dimer within normal limits.  - Maintain euvolemia/net negative.  - Avoid NSAIDs  - Dulera (formulary equivalent), combivent, prn albuterol - Continue maximal inhalers for wheezing, steroids.  - Augmented antitussives to include prn lozenge and scheduled tessalon TID.  Right pleural effusion:  - Resolved on CXR 1/24   OSA:  - CPAP qHS if necessary (NOT for respiratory failure  indication)  Morbid obesity: BMI 44.85. Worsens risk profile for progression.   Urinary hesitancy:  - UA negative. Continue flomax. Recommend outpatient urology follow up. MR prostate showing no high-grade carcinoma in the peripheral zone (PI-RADS: 1) and enlarged transitional zone most consistent benign prostate hypertrophy (PI-RADS: 1).  DVT prophylaxis: Lovenox 0.5mg /kg q12h Code Status: Full  Family Communication: None at bedside, pt to relay POC Disposition Plan: Uncertain, guarded prognosis, pending improvement in hypoxemia.  Consultants:   None  Procedures:   None  Antimicrobials:  Remdesivir 1/21 - 1/25  Subjective: No appreciable change since yesterday. Still moderately short of breath at rest, severely with exertion, interrupting sleeping with severe persistent cough. Inhalers seem to help.  Objective: Vitals:   04/10/19 0000 04/10/19 0718 04/10/19 0800 04/10/19 1114  BP: 124/82 127/82  119/60  Pulse: 60 (!) 59  65  Resp:  (!) 22  16  Temp: 98 F (36.7 C)  97.6 F (36.4 C) 97.8 F (36.6 C)  TempSrc: Oral  Oral Axillary  SpO2: 91%   90%  Weight:      Height:        Intake/Output Summary (Last 24 hours) at 04/10/2019 1650 Last data filed at 04/10/2019 1230 Gross per 24 hour  Intake 610 ml  Output 850 ml  Net -240 ml   Filed Weights   04/05/19 2213  Weight: 133.8 kg   Gen: 59 y.o. male in no distress Pulm: Nonlabored breathing 8LPM, diffuse crackles mostly in midlung zones bilaterally. CV: Regular rate and rhythm. No murmur, rub, or gallop. No JVD, no dependent edema. GI: Abdomen soft, non-tender, non-distended, with normoactive bowel sounds.  Ext: Warm, no  deformities Skin: No rashes, lesions or ulcers on visualized skin. Neuro: Alert and oriented. No focal neurological deficits. Psych: Judgement and insight appear fair. Mood euthymic & affect congruent. Behavior is appropriate.    Data Reviewed: I have personally reviewed following labs and  imaging studies  CBC: Recent Labs  Lab 04/05/19 1230 04/06/19 0006 04/07/19 0009 04/08/19 0610  WBC 8.3 9.6 10.8* 14.1*  NEUTROABS 7.1 8.8* 9.9* 12.5*  HGB 14.0 13.5 13.2 13.5  HCT 43.5 41.5 41.1 41.6  MCV 87.7 89.1 88.0 88.3  PLT 237 277 294 0000000   Basic Metabolic Panel: Recent Labs  Lab 04/06/19 0006 04/07/19 0009 04/08/19 0610 04/09/19 0305 04/10/19 0358  NA 137 136 136 136 136  K 3.9 4.4 4.7 5.0 4.8  CL 101 101 103 103 103  CO2 24 26 24 24 25   GLUCOSE 115* 125* 121* 137* 145*  BUN 25* 32* 38* 39* 35*  CREATININE 1.03 0.98 0.91 1.18 1.02  CALCIUM 8.8* 8.8* 8.7* 8.5* 8.4*   GFR: Estimated Creatinine Clearance: 105.6 mL/min (by C-G formula based on SCr of 1.02 mg/dL). Liver Function Tests: Recent Labs  Lab 04/06/19 0006 04/07/19 0009 04/08/19 0610 04/09/19 0305 04/10/19 0358  AST 28 31 24 23 22   ALT 24 23 23 23 29   ALKPHOS 56 52 52 51 51  BILITOT 0.5 0.5 0.5 0.6 0.6  PROT 8.5* 8.0 7.6 7.2 6.9  ALBUMIN 3.1* 2.8* 2.8* 2.9* 2.7*   Urine analysis:    Component Value Date/Time   COLORURINE YELLOW 04/08/2019 1905   APPEARANCEUR CLEAR 04/08/2019 1905   LABSPEC 1.021 04/08/2019 1905   PHURINE 6.0 04/08/2019 1905   GLUCOSEU NEGATIVE 04/08/2019 1905   HGBUR NEGATIVE 04/08/2019 1905   BILIRUBINUR NEGATIVE 04/08/2019 1905   KETONESUR NEGATIVE 04/08/2019 1905   PROTEINUR NEGATIVE 04/08/2019 1905   NITRITE NEGATIVE 04/08/2019 1905   LEUKOCYTESUR NEGATIVE 04/08/2019 1905    Scheduled Meds: . vitamin C  500 mg Oral Daily  . benzonatate  200 mg Oral TID  . dexamethasone  6 mg Oral Q24H  . docusate sodium  100 mg Oral Daily  . enoxaparin (LOVENOX) injection  65 mg Subcutaneous Q12H  . Ipratropium-Albuterol  1 puff Inhalation Q6H  . mometasone-formoterol  2 puff Inhalation BID  . pantoprazole  40 mg Oral Daily  . tamsulosin  0.4 mg Oral QPC supper  . zinc sulfate  220 mg Oral Daily   Continuous Infusions:    LOS: 5 days   Time spent: 35 minutes.  Patrecia Pour, MD [facility] [email or web address] 04/10/2019, 4:50 PM

## 2019-04-11 ENCOUNTER — Inpatient Hospital Stay (HOSPITAL_COMMUNITY): Payer: BC Managed Care – PPO

## 2019-04-11 DIAGNOSIS — J9600 Acute respiratory failure, unspecified whether with hypoxia or hypercapnia: Secondary | ICD-10-CM

## 2019-04-11 LAB — D-DIMER, QUANTITATIVE: D-Dimer, Quant: 1.02 ug/mL-FEU — ABNORMAL HIGH (ref 0.00–0.50)

## 2019-04-11 LAB — CBC WITH DIFFERENTIAL/PLATELET
Abs Immature Granulocytes: 3.24 10*3/uL — ABNORMAL HIGH (ref 0.00–0.07)
Basophils Absolute: 0 10*3/uL (ref 0.0–0.1)
Basophils Relative: 0 %
Eosinophils Absolute: 0 10*3/uL (ref 0.0–0.5)
Eosinophils Relative: 0 %
HCT: 46.6 % (ref 39.0–52.0)
Hemoglobin: 15.1 g/dL (ref 13.0–17.0)
Immature Granulocytes: 11 %
Lymphocytes Relative: 7 %
Lymphs Abs: 1.9 10*3/uL (ref 0.7–4.0)
MCH: 28.6 pg (ref 26.0–34.0)
MCHC: 32.4 g/dL (ref 30.0–36.0)
MCV: 88.3 fL (ref 80.0–100.0)
Monocytes Absolute: 0.6 10*3/uL (ref 0.1–1.0)
Monocytes Relative: 2 %
Neutro Abs: 23.5 10*3/uL — ABNORMAL HIGH (ref 1.7–7.7)
Neutrophils Relative %: 80 %
Platelets: 472 10*3/uL — ABNORMAL HIGH (ref 150–400)
RBC: 5.28 MIL/uL (ref 4.22–5.81)
RDW: 15.8 % — ABNORMAL HIGH (ref 11.5–15.5)
WBC: 29.3 10*3/uL — ABNORMAL HIGH (ref 4.0–10.5)
nRBC: 0.2 % (ref 0.0–0.2)

## 2019-04-11 LAB — COMPREHENSIVE METABOLIC PANEL
ALT: 30 U/L (ref 0–44)
AST: 23 U/L (ref 15–41)
Albumin: 3.1 g/dL — ABNORMAL LOW (ref 3.5–5.0)
Alkaline Phosphatase: 62 U/L (ref 38–126)
Anion gap: 13 (ref 5–15)
BUN: 33 mg/dL — ABNORMAL HIGH (ref 6–20)
CO2: 22 mmol/L (ref 22–32)
Calcium: 8.6 mg/dL — ABNORMAL LOW (ref 8.9–10.3)
Chloride: 102 mmol/L (ref 98–111)
Creatinine, Ser: 1.11 mg/dL (ref 0.61–1.24)
GFR calc Af Amer: >60 mL/min (ref >60)
GFR calc non Af Amer: >60 mL/min (ref >60)
Glucose, Bld: 128 mg/dL — ABNORMAL HIGH (ref 70–99)
Potassium: 4.4 mmol/L (ref 3.5–5.1)
Sodium: 137 mmol/L (ref 135–145)
Total Bilirubin: 0.8 mg/dL (ref 0.3–1.2)
Total Protein: 7.6 g/dL (ref 6.5–8.1)

## 2019-04-11 LAB — C-REACTIVE PROTEIN: CRP: 1.5 mg/dL — ABNORMAL HIGH (ref <1.0)

## 2019-04-11 MED ORDER — SODIUM CHLORIDE 0.9 % IV SOLN
2.0000 g | INTRAVENOUS | Status: AC
  Administered 2019-04-11 – 2019-04-14 (×4): 2 g via INTRAVENOUS
  Filled 2019-04-11 (×5): qty 20

## 2019-04-11 MED ORDER — VANCOMYCIN HCL 1500 MG/300ML IV SOLN
1500.0000 mg | INTRAVENOUS | Status: DC
  Administered 2019-04-12 – 2019-04-13 (×2): 1500 mg via INTRAVENOUS
  Filled 2019-04-11 (×3): qty 300

## 2019-04-11 MED ORDER — VANCOMYCIN HCL 2000 MG/400ML IV SOLN
2000.0000 mg | Freq: Once | INTRAVENOUS | Status: AC
  Administered 2019-04-11: 2000 mg via INTRAVENOUS
  Filled 2019-04-11: qty 400

## 2019-04-11 NOTE — Progress Notes (Addendum)
[facility] PROGRESS NOTE    Progress Note  Sean Garza  [id number] DOB: 08-Jan-1961 DOA: 04/05/2019 PCP: Albina Billet, MD     Brief Narrative:   Sean Garza is an 59 y.o. male past medical history of rheumatoid arthritis, DVT obstructive sleep apnea obesity presents to [facility] ED on 04/05/2019 with worsening shortness of breath, patient relates she started having symptoms about 6 days prior to admission in the ED was found to be hypoxic SARS-CoV-2 PCR was positive chest x-ray revealed bilateral infiltrates, she was started on IV remdesivir and steroids to eculizumab was administered on 04/05/2020 worsening inflammatory markers and hypoxia.  Assessment/Plan:   Acute respiratory failure with hypoxia/ Acute hypoxemic respiratory failure due to severe acute respiratory syndrome coronavirus 2 (SARS-CoV-2) disease (HCC) SARS-CoV-2 PCR was positive on 04/05/2019 he has completed his course of IV remdesivir, will continue IV steroids for total of 10 days, he is status Actemra 1.22 2021. Patient was requiring about 8-10 L of oxygen to keep saturations greater than 91%, this morning he is on 10 L of high flow and his saturations have been greater than 92%, overnight apparently he desats when he gets increased to 10 L, probably due to his obstructive sleep apnea. Try to keep the patient prone for early 16 hours a day, if not prone out of bed to chair. Continue intermediate dose Lovenox. Keep the patient euvolemic strict I's and O's and daily weights. Patient is not proning adequately we will try to encourage him.  He relates his breathing is worse than yesterday, he does look more labored and is requiring more oxygen we will keep a close eye on him.  Right pleural effusion: Resolved by chest x-ray on 04/08/2019.  Obstructive sleep apnea: Use CPAP at night.  Not for use of acute respiratory hypoxia, we are using it with his home dose setting as he has obstructive sleep apnea.  Obesity,  Class III, BMI 40-49.9 (morbid obesity) (HCC) Counseling.  Asthma Continue inhalers.   DVT prophylaxis: lovenox Family Communication:none Disposition Plan/Barrier to D/C: Home once he is off oxygen patient is not proning.  Code Status:     Code Status Orders  (From admission, onward)         Start     Ordered   04/05/19 2140  Full code  Continuous     04/05/19 2141        Code Status History    Date Active Date Inactive Code Status Order ID Comments User Context   04/05/2019 1859 04/05/2019 2135 Full Code [id number]  Jennye Boroughs, MD ED   03/24/2017 1506 03/24/2017 1841 Full Code [id number]  Corky Mull, MD Inpatient   08/06/2015 1857 08/07/2015 1659 Full Code [id number]  Vaughan Basta, MD Inpatient   Advance Care Planning Activity        IV Access:    Peripheral IV   Procedures and diagnostic studies:   No results found.   Medical Consultants:    None.  Anti-Infectives:   None  Subjective:    Sean Garza relates his breathing is unchanged compared to yesterday.  Objective:    Vitals:   04/10/19 1114 04/10/19 1705 04/10/19 1910 04/11/19 0746  BP: 119/60 106/72  136/85  Pulse: 65 67    Resp: 16  20   Temp: 97.8 F (36.6 C) (!) 97.4 F (36.3 C) 98.1 F (36.7 C) (!) 97.2 F (36.2 C)  TempSrc: Axillary Oral Oral Axillary  SpO2: 90% 94% 92%  Weight:      Height:       SpO2: 92 % O2 Flow Rate (L/min): 10 L/min   Intake/Output Summary (Last 24 hours) at 04/11/2019 0802 Last data filed at 04/10/2019 1705 Gross per 24 hour  Intake 480 ml  Output 400 ml  Net 80 ml   Filed Weights   04/05/19 2213  Weight: 133.8 kg    Exam: General exam: In no acute distress, morbidly obese Respiratory system: Good air movement and clear to auscultation. Cardiovascular system: S1 & S2 heard, RRR. No JVD. Gastrointestinal system: Abdomen is nondistended, soft and nontender.  Central nervous system: Alert and oriented.  Extremities: No  pedal edema. Skin: No rashes, lesions or ulcers Psychiatry: Judgement and insight appear normal. Mood & affect appropriate.  Data Reviewed:    Labs: Basic Metabolic Panel: Recent Labs  Lab 04/07/19 0009 04/07/19 0009 04/08/19 0610 04/08/19 0610 04/09/19 0305 04/09/19 0305 04/10/19 0358 04/11/19 0353  NA 136  --  136  --  136  --  136 137  K 4.4   < > 4.7   < > 5.0   < > 4.8 4.4  CL 101  --  103  --  103  --  103 102  CO2 26  --  24  --  24  --  25 22  GLUCOSE 125*  --  121*  --  137*  --  145* 128*  BUN 32*  --  38*  --  39*  --  35* 33*  CREATININE 0.98  --  0.91  --  1.18  --  1.02 1.11  CALCIUM 8.8*  --  8.7*  --  8.5*  --  8.4* 8.6*   < > = values in this interval not displayed.   GFR Estimated Creatinine Clearance: 97.1 mL/min (by C-G formula based on SCr of 1.11 mg/dL). Liver Function Tests: Recent Labs  Lab 04/07/19 0009 04/08/19 0610 04/09/19 0305 04/10/19 0358 04/11/19 0353  AST 31 24 23 22 23   ALT 23 23 23 29 30   ALKPHOS 52 52 51 51 62  BILITOT 0.5 0.5 0.6 0.6 0.8  PROT 8.0 7.6 7.2 6.9 7.6  ALBUMIN 2.8* 2.8* 2.9* 2.7* 3.1*   No results for input(s): LIPASE, AMYLASE in the last 168 hours. No results for input(s): AMMONIA in the last 168 hours. Coagulation profile No results for input(s): INR, PROTIME in the last 168 hours. COVID-19 Labs  Recent Labs    04/09/19 0305 04/10/19 0358 04/11/19 0353  DDIMER  --   --  1.02*  CRP 5.0* 2.2* 1.5*    Lab Results  Component Value Date   SARSCOV2NAA Not Detected 09/13/2018    CBC: Recent Labs  Lab 04/05/19 1230 04/06/19 0006 04/07/19 0009 04/08/19 0610 04/11/19 0353  WBC 8.3 9.6 10.8* 14.1* 29.3*  NEUTROABS 7.1 8.8* 9.9* 12.5* 23.5*  HGB 14.0 13.5 13.2 13.5 15.1  HCT 43.5 41.5 41.1 41.6 46.6  MCV 87.7 89.1 88.0 88.3 88.3  PLT 237 277 294 324 472*   Cardiac Enzymes: No results for input(s): CKTOTAL, CKMB, CKMBINDEX, TROPONINI in the last 168 hours. BNP (last 3 results) No results for  input(s): PROBNP in the last 8760 hours. CBG: No results for input(s): GLUCAP in the last 168 hours. D-Dimer: Recent Labs    04/11/19 0353  DDIMER 1.02*   Hgb A1c: No results for input(s): HGBA1C in the last 72 hours. Lipid Profile: No results for input(s): CHOL, HDL, LDLCALC, TRIG, CHOLHDL, LDLDIRECT  in the last 72 hours. Thyroid function studies: No results for input(s): TSH, T4TOTAL, T3FREE, THYROIDAB in the last 72 hours.  Invalid input(s): FREET3 Anemia work up: No results for input(s): VITAMINB12, FOLATE, FERRITIN, TIBC, IRON, RETICCTPCT in the last 72 hours. Sepsis Labs: Recent Labs  Lab 04/05/19 1230 04/05/19 1230 04/05/19 1515 04/06/19 0006 04/07/19 0009 04/08/19 0610 04/11/19 0353  PROCALCITON  --   --  0.10  --   --   --   --   WBC 8.3   < >  --  9.6 10.8* 14.1* 29.3*  LATICACIDVEN 1.5  --  1.1  --   --   --   --    < > = values in this interval not displayed.   Microbiology No results found for this or any previous visit (from the past 240 hour(s)).   Medications:   . vitamin C  500 mg Oral Daily  . benzonatate  200 mg Oral TID  . dexamethasone  6 mg Oral Q24H  . docusate sodium  100 mg Oral Daily  . enoxaparin (LOVENOX) injection  65 mg Subcutaneous Q12H  . Ipratropium-Albuterol  1 puff Inhalation Q6H  . mometasone-formoterol  2 puff Inhalation BID  . pantoprazole  40 mg Oral Daily  . tamsulosin  0.4 mg Oral QPC supper  . zinc sulfate  220 mg Oral Daily   Continuous Infusions:   LOS: 6 days   Charlynne Cousins  [facility]  04/11/2019, 8:02 AM

## 2019-04-11 NOTE — Progress Notes (Signed)
PT Cancellation Note  Patient Details Name: FERRELL MONSERRAT MRN: [id number] DOB: [birth year]   Cancelled Treatment:    Reason Eval/Treat Not Completed: Medical issues which prohibited therapy , patient in recliner, reports O2 was just decreased and he  Would rather not move around. Check back tomorrow.  Claretha Cooper 04/11/2019, 2:34 PM  [place]  Office [phone]

## 2019-04-11 NOTE — Progress Notes (Signed)
Pharmacy Antibiotic Note  Sean Garza is a 59 y.o. male admitted on 04/05/2019 with acute respiratory failure from COVID-19.  Pharmacy has been consulted for vancomycin dosing for possible bacterial PNA. Ceftriaxone also ordered.   Plan: -Vancomycin 2000 mg load then 1500 mg IV Q 18 hrs. Goal AUC 400-550. Expected AUC: 477 SCr used: 1.11 -F/U MRSA PCR -F/U renal function,  plans for antibiotics   Height: 5\' 8"  (172.7 cm) Weight: 294 lb 15.6 oz (133.8 kg) IBW/kg (Calculated) : 68.4  Temp (24hrs), Avg:97.6 F (36.4 C), Min:97.2 F (36.2 C), Max:98.1 F (36.7 C)  Recent Labs  Lab 04/05/19 1230 04/05/19 1230 04/05/19 1515 04/06/19 0006 04/06/19 0006 04/07/19 0009 04/08/19 0610 04/09/19 0305 04/10/19 0358 04/11/19 0353  WBC 8.3  --   --  9.6  --  10.8* 14.1*  --   --  29.3*  CREATININE 1.01   < >  --  1.03   < > 0.98 0.91 1.18 1.02 1.11  LATICACIDVEN 1.5  --  1.1  --   --   --   --   --   --   --    < > = values in this interval not displayed.    Estimated Creatinine Clearance: 97.1 mL/min (by C-G formula based on SCr of 1.11 mg/dL).    Allergies  Allergen Reactions  . Penicillins Other (See Comments)    Childhood reaction. Has patient had a PCN reaction causing immediate rash, facial/tongue/throat swelling, SOB or lightheadedness with hypotension: Unknown Has patient had a PCN reaction causing severe rash involving mucus membranes or skin necrosis: Unknown Has patient had a PCN reaction that required hospitalization: No Has patient had a PCN reaction occurring within the last 10 years: No If all of the above answers are "NO", then may proceed with Cephalosporin use.     Antimicrobials this admission: 1/27 CTX >>  1/27 vanc >>   Dose adjustments this admission:   Microbiology results: 1/27 MRSA PCR:   Thank you for allowing pharmacy to be a part of this patient's care.  Ulice Dash D 04/11/2019 10:13 AM

## 2019-04-12 LAB — PROCALCITONIN: Procalcitonin: <0.1 ng/mL

## 2019-04-12 MED ORDER — FUROSEMIDE 10 MG/ML IJ SOLN
40.0000 mg | Freq: Once | INTRAMUSCULAR | Status: AC
  Administered 2019-04-12: 11:00:00 40 mg via INTRAVENOUS
  Filled 2019-04-12: qty 4

## 2019-04-12 MED ORDER — POLYETHYLENE GLYCOL 3350 17 G PO PACK
17.0000 g | PACK | Freq: Every day | ORAL | Status: DC
  Filled 2019-04-12 (×2): qty 1

## 2019-04-12 NOTE — Evaluation (Signed)
Physical Therapy Evaluation Patient Details Name: Sean Garza MRN: [id number] DOB: 01/16/1961 Today's Date: 04/12/2019   History of Present Illness  59 y.o. male past medical history of rheumatoid arthritis, DVT obstructive sleep apnea obesity presents to [facility] ED on 04/05/2019 with worsening shortness of breath, patient relates she started having symptoms about 6 days prior to admission in the ED was found to be hypoxic SARS-CoV-2 PCR was positive chest x-ray revealed bilateral infiltrates, he was started on IV remdesivir and steroids to eculizumab was administered on 04/05/2020 worsening inflammatory markers and hypoxia.  Clinical Impression   Pt admitted with above diagnosis. PTA was living home with family and was very independent, states works 7 days a week usually and walks approx 6-7 miles a day through that work. Pt currently with functional limitations due to the deficits listed below (see PT Problem List). This am pt was doing well with mobility, he was found sitting in recliner states had been working on breathing exercises. Pt agreeable to tx and needed mod I with transfers and min guard assist with ambulation. Pt ambulated in room approx 2 x31ft w/ seated rest between each with cues for pursed lip breathing, noted min desat to 83% but was able to recover very fast with pursed lip breathing. Pt has been ereinforced on use of and importance of completing as prescribed incentive spirometer and flutter valve. Pt was on 8L/min via HFNC at rest satting in 90s and 10L/min with activity satting 83-96%  Pt will benefit from skilled PT to increase their independence and safety with mobility to allow discharge to the venue listed below.        Follow Up Recommendations No PT follow up    Equipment Recommendations  None recommended by PT    Recommendations for Other Services OT consult     Precautions / Restrictions Precautions Precautions: Other (comment) Precaution Comments: desat with  min activity Restrictions Weight Bearing Restrictions: No      Mobility  Bed Mobility               General bed mobility comments: Pt sitting in recliner at therapist arrival  Transfers Overall transfer level: Needs assistance Equipment used: None Transfers: Stand Pivot Transfers;Sit to/from Stand Sit to Stand: Supervision Stand pivot transfers: Supervision       General transfer comment: does well needs some set up and line manegement  Ambulation/Gait Ambulation/Gait assistance: Min guard Gait Distance (Feet): 42 Feet Assistive device: None Gait Pattern/deviations: Step-through pattern;Wide base of support Gait velocity: fair   General Gait Details: 2x 50ft with seated rest between each with min guard assist  Stairs            Wheelchair Mobility    Modified Rankin (Stroke Patients Only)       Balance Overall balance assessment: Mild deficits observed, not formally tested                                           Pertinent Vitals/Pain Pain Assessment: 0-10 Pain Score: 4  Pain Descriptors / Indicators: Burning(in chest with breathing)    Home Living Family/patient expects to be discharged to:: Private residence Living Arrangements: Spouse/significant other Available Help at Discharge: Family Type of Home: House Home Access: Stairs to enter   Technical brewer of Steps: 2 small x 2 Home Layout: One level Home Equipment: Crutches  Prior Function Level of Independence: Independent               Hand Dominance   Dominant Hand: Right    Extremity/Trunk Assessment   Upper Extremity Assessment Upper Extremity Assessment: Overall WFL for tasks assessed    Lower Extremity Assessment Lower Extremity Assessment: Overall WFL for tasks assessed    Cervical / Trunk Assessment Cervical / Trunk Assessment: Normal  Communication   Communication: No difficulties  Cognition Arousal/Alertness:  Awake/alert Behavior During Therapy: WFL for tasks assessed/performed Overall Cognitive Status: Within Functional Limits for tasks assessed                                        General Comments General comments (skin integrity, edema, etc.): Pt was on 8L/min via HFNC and at rest able to maintain sats in 90s, with ambulation increased  to 10L/min via HFNC and noted min desat was 83%    Exercises Other Exercises Other Exercises: incentive spirometer x 5 able to pull max 75ml Other Exercises: flutter valve use x 5   Assessment/Plan    PT Assessment Patient needs continued PT services  PT Problem List Decreased activity tolerance;Decreased balance;Decreased mobility;Decreased coordination;Decreased safety awareness       PT Treatment Interventions Gait training;Stair training;Functional mobility training;Therapeutic activities;Therapeutic exercise;Balance training;Neuromuscular re-education;Patient/family education    PT Goals (Current goals can be found in the Care Plan section)  Acute Rehab PT Goals Patient Stated Goal: to get better and return home to family PT Goal Formulation: With patient Time For Goal Achievement: 04/26/19 Potential to Achieve Goals: Good    Frequency Min 3X/week   Barriers to discharge        Co-evaluation               AM-PAC PT "6 Clicks" Mobility  Outcome Measure Help needed turning from your back to your side while in a flat bed without using bedrails?: None Help needed moving from lying on your back to sitting on the side of a flat bed without using bedrails?: None Help needed moving to and from a bed to a chair (including a wheelchair)?: A Little Help needed standing up from a chair using your arms (e.g., wheelchair or bedside chair)?: A Little Help needed to walk in hospital room?: A Little Help needed climbing 3-5 steps with a railing? : A Little 6 Click Score: 20    End of Session Equipment Utilized During  Treatment: Oxygen Activity Tolerance: Treatment limited secondary to medical complications (Comment) Patient left: in chair;with call bell/phone within reach Nurse Communication: Mobility status;Other (comment)(post tx diposition) PT Visit Diagnosis: Other abnormalities of gait and mobility (R26.89)    Time: [id number] PT Time Calculation (min) (ACUTE ONLY): 30 min   Charges:   PT Evaluation $PT Eval Moderate Complexity: 1 Mod PT Treatments $Gait Training: 8-22 mins        Horald Chestnut, PT   Delford Field 04/12/2019, 12:11 PM

## 2019-04-12 NOTE — Progress Notes (Signed)
[facility] PROGRESS NOTE    Progress Note  Sean Garza  [id number] DOB: [birth year] DOA: 04/05/2019 PCP: Albina Billet, MD     Brief Narrative:   Sean Garza is an 59 y.o. male past medical history of rheumatoid arthritis, DVT obstructive sleep apnea obesity presents to [facility] ED on 04/05/2019 with worsening shortness of breath, patient relates she started having symptoms about 6 days prior to admission in the ED was found to be hypoxic SARS-CoV-2 PCR was positive chest x-ray revealed bilateral infiltrates, she was started on IV remdesivir and steroids to eculizumab was administered on 04/05/2020 worsening inflammatory markers and hypoxia.  Assessment/Plan:   Acute respiratory failure with hypoxia/ Acute hypoxemic respiratory failure due to severe acute respiratory syndrome coronavirus 2 (SARS-CoV-2) disease ([place]) He is currently quiring 8 to 10 L of high flow nasal cannula to keep saturations greater than 92%, overnight he was on CPAP. He has completed his course of IV remdesivir, will continue steroids for a total of 10 days he is status post Actemra on 04/06/2019, will cont IV vanc and rocephin.  His overall picture looks slightly worse today, will continue the empiric antibiotics his white count showed up to 29, procalcitonin is pending this morning. Try to keep the patient peripherally 16 hours a day, if not prone out of bed to chair, continue intermediate dose Lovenox. Patient is not willing to prone I have encouraged him yesterday and today. He relates his breathing is not better, he relates he feels more comfortable with the CPAP machine. We will give him a dose of IV Lasix recheck a basic metabolic panel in the morning.  Right pleural effusion: Resolved by chest x-ray on 04/08/2019.  Obstructive sleep apnea: Using his CPAP at night he relates he feels more comfortable with it.  Obesity, Class III, BMI 40-49.9 (morbid obesity) (HCC) Counseling.  Asthma Continue  inhalers.   DVT prophylaxis: lovenox Family Communication:none Disposition Plan/Barrier to D/C: Home once he is off oxygen patient is not proning.  Code Status:     Code Status Orders  (From admission, onward)         Start     Ordered   04/05/19 2140  Full code  Continuous     04/05/19 2141        Code Status History    Date Active Date Inactive Code Status Order ID Comments User Context   04/05/2019 1859 04/05/2019 2135 Full Code [id number]  Jennye Boroughs, MD ED   03/24/2017 1506 03/24/2017 1841 Full Code [id number]  Corky Mull, MD Inpatient   08/06/2015 1857 08/07/2015 1659 Full Code [id number]  Vaughan Basta, MD Inpatient   Advance Care Planning Activity        IV Access:    Peripheral IV   Procedures and diagnostic studies:   CT CHEST WO CONTRAST  Result Date: 04/11/2019 CLINICAL DATA:  Dyspnea on exertion. Pneumonia due to COVID-19 infection. EXAM: CT CHEST WITHOUT CONTRAST TECHNIQUE: Multidetector CT imaging of the chest was performed following the standard protocol without IV contrast. COMPARISON:  Chest radiographs, 04/08/2019.  Chest CTA, 08/06/2015. FINDINGS: Cardiovascular: Heart normal in size and configuration. No pericardial effusion or coronary artery calcifications. Normal great vessels. Mediastinum/Nodes: No enlarged mediastinal or axillary lymph nodes. Thyroid gland, trachea, and esophagus demonstrate no significant findings. Lungs/Pleura: There is hazy ground-glass opacity that extends throughout most of the lungs. There is no lung mass or nodule. No pleural effusion or pneumothorax. Airways are unremarkable. Upper Abdomen: Small hiatal hernia.  No acute findings in the visualized upper abdomen. Musculoskeletal: No fracture or acute finding. No bone lesion. No chest wall mass. IMPRESSION: 1. Bilateral, extensive ground-glass lung opacities consistent with multifocal pneumonia due to COVID-19 infection. No other acute abnormality. 2. No evidence of  pulmonary edema. 3. Small hiatal hernia. Electronically Signed   By: Lajean Manes M.D.   On: 04/11/2019 10:49     Medical Consultants:    None.  Anti-Infectives:   None  Subjective:    Sean Garza relates his breathing is unchanged compared to yesterday.  Objective:    Vitals:   04/11/19 2255 04/11/19 2357 04/12/19 0009 04/12/19 0417  BP:  103/74 120/75 118/67  Pulse: 65 (!) 57 (!) 59 60  Resp: 20 18 20 20   Temp:  97.9 F (36.6 C) 97.9 F (36.6 C) 97.8 F (36.6 C)  TempSrc:  Axillary Axillary Axillary  SpO2: 98% 95% 98% (!) 89%  Weight:      Height:       SpO2: (!) 89 % O2 Flow Rate (L/min): 7 L/min   Intake/Output Summary (Last 24 hours) at 04/12/2019 [id number] Last data filed at 04/12/2019 0600 Gross per 24 hour  Intake 1700 ml  Output 1500 ml  Net 200 ml   Filed Weights   04/05/19 2213  Weight: 133.8 kg    Exam: General exam: In no acute distress, morbidly obese Respiratory system: Good air movement and clear to auscultation. Cardiovascular system: S1 & S2 heard, RRR. No JVD. Gastrointestinal system: Abdomen is nondistended, soft and nontender.  Central nervous system: Alert and oriented.  Extremities: No pedal edema. Skin: No rashes, lesions or ulcers Psychiatry: Judgement and insight appear normal. Mood & affect appropriate.  Data Reviewed:    Labs: Basic Metabolic Panel: Recent Labs  Lab 04/07/19 0009 04/07/19 0009 04/08/19 0610 04/08/19 0610 04/09/19 0305 04/09/19 0305 04/10/19 0358 04/11/19 0353  NA 136  --  136  --  136  --  136 137  K 4.4   < > 4.7   < > 5.0   < > 4.8 4.4  CL 101  --  103  --  103  --  103 102  CO2 26  --  24  --  24  --  25 22  GLUCOSE 125*  --  121*  --  137*  --  145* 128*  BUN 32*  --  38*  --  39*  --  35* 33*  CREATININE 0.98  --  0.91  --  1.18  --  1.02 1.11  CALCIUM 8.8*  --  8.7*  --  8.5*  --  8.4* 8.6*   < > = values in this interval not displayed.   GFR Estimated Creatinine Clearance: 97.1  mL/min (by C-G formula based on SCr of 1.11 mg/dL). Liver Function Tests: Recent Labs  Lab 04/07/19 0009 04/08/19 0610 04/09/19 0305 04/10/19 0358 04/11/19 0353  AST 31 24 23 22 23   ALT 23 23 23 29 30   ALKPHOS 52 52 51 51 62  BILITOT 0.5 0.5 0.6 0.6 0.8  PROT 8.0 7.6 7.2 6.9 7.6  ALBUMIN 2.8* 2.8* 2.9* 2.7* 3.1*   No results for input(s): LIPASE, AMYLASE in the last 168 hours. No results for input(s): AMMONIA in the last 168 hours. Coagulation profile No results for input(s): INR, PROTIME in the last 168 hours. COVID-19 Labs  Recent Labs    04/10/19 0358 04/11/19 0353  DDIMER  --  1.02*  CRP 2.2*  1.5*    Lab Results  Component Value Date   Frank Not Detected 09/13/2018    CBC: Recent Labs  Lab 04/05/19 1230 04/06/19 0006 04/07/19 0009 04/08/19 0610 04/11/19 0353  WBC 8.3 9.6 10.8* 14.1* 29.3*  NEUTROABS 7.1 8.8* 9.9* 12.5* 23.5*  HGB 14.0 13.5 13.2 13.5 15.1  HCT 43.5 41.5 41.1 41.6 46.6  MCV 87.7 89.1 88.0 88.3 88.3  PLT 237 277 294 324 472*   Cardiac Enzymes: No results for input(s): CKTOTAL, CKMB, CKMBINDEX, TROPONINI in the last 168 hours. BNP (last 3 results) No results for input(s): PROBNP in the last 8760 hours. CBG: No results for input(s): GLUCAP in the last 168 hours. D-Dimer: Recent Labs    04/11/19 0353  DDIMER 1.02*   Hgb A1c: No results for input(s): HGBA1C in the last 72 hours. Lipid Profile: No results for input(s): CHOL, HDL, LDLCALC, TRIG, CHOLHDL, LDLDIRECT in the last 72 hours. Thyroid function studies: No results for input(s): TSH, T4TOTAL, T3FREE, THYROIDAB in the last 72 hours.  Invalid input(s): FREET3 Anemia work up: No results for input(s): VITAMINB12, FOLATE, FERRITIN, TIBC, IRON, RETICCTPCT in the last 72 hours. Sepsis Labs: Recent Labs  Lab 04/05/19 1230 04/05/19 1230 04/05/19 1515 04/06/19 0006 04/07/19 0009 04/08/19 0610 04/11/19 0353  PROCALCITON  --   --  0.10  --   --   --   --   WBC 8.3   < >   --  9.6 10.8* 14.1* 29.3*  LATICACIDVEN 1.5  --  1.1  --   --   --   --    < > = values in this interval not displayed.   Microbiology No results found for this or any previous visit (from the past 240 hour(s)).   Medications:   . vitamin C  500 mg Oral Daily  . benzonatate  200 mg Oral TID  . dexamethasone  6 mg Oral Q24H  . docusate sodium  100 mg Oral Daily  . enoxaparin (LOVENOX) injection  65 mg Subcutaneous Q12H  . Ipratropium-Albuterol  1 puff Inhalation Q6H  . mometasone-formoterol  2 puff Inhalation BID  . pantoprazole  40 mg Oral Daily  . tamsulosin  0.4 mg Oral QPC supper  . zinc sulfate  220 mg Oral Daily   Continuous Infusions: . cefTRIAXone (ROCEPHIN)  IV 2 g (04/11/19 1057)  . vancomycin 1,500 mg (04/12/19 0508)     LOS: 7 days   Charlynne Cousins  [facility]  04/12/2019, 7:22 AM

## 2019-04-12 NOTE — Plan of Care (Signed)
Patient sitting up in recliner. Sudden upper abdominal cramping, 02 sat down into 70's, 02 increased to 15L HFNC, coached through slow deep breathing and demonstrated understandin, scheduled inhaler given, 02 probe changed to right ear, sats up to 96 % and 02 flow decreased to 10L HFNC with sats 91 %. Assisted to bed and patient lying on left side. Will continue to monitor.

## 2019-04-13 LAB — CBC WITH DIFFERENTIAL/PLATELET
Abs Immature Granulocytes: 1.73 10*3/uL — ABNORMAL HIGH (ref 0.00–0.07)
Basophils Absolute: 0.1 10*3/uL (ref 0.0–0.1)
Basophils Relative: 1 %
Eosinophils Absolute: 0 10*3/uL (ref 0.0–0.5)
Eosinophils Relative: 0 %
HCT: 44.6 % (ref 39.0–52.0)
Hemoglobin: 14.3 g/dL (ref 13.0–17.0)
Immature Granulocytes: 8 %
Lymphocytes Relative: 4 %
Lymphs Abs: 0.9 10*3/uL (ref 0.7–4.0)
MCH: 28.6 pg (ref 26.0–34.0)
MCHC: 32.1 g/dL (ref 30.0–36.0)
MCV: 89.2 fL (ref 80.0–100.0)
Monocytes Absolute: 0.6 10*3/uL (ref 0.1–1.0)
Monocytes Relative: 3 %
Neutro Abs: 17.6 10*3/uL — ABNORMAL HIGH (ref 1.7–7.7)
Neutrophils Relative %: 84 %
Platelets: 361 10*3/uL (ref 150–400)
RBC: 5 MIL/uL (ref 4.22–5.81)
RDW: 16.1 % — ABNORMAL HIGH (ref 11.5–15.5)
WBC: 20.9 10*3/uL — ABNORMAL HIGH (ref 4.0–10.5)
nRBC: 0 % (ref 0.0–0.2)

## 2019-04-13 LAB — BASIC METABOLIC PANEL
Anion gap: 11 (ref 5–15)
BUN: 41 mg/dL — ABNORMAL HIGH (ref 6–20)
CO2: 24 mmol/L (ref 22–32)
Calcium: 8 mg/dL — ABNORMAL LOW (ref 8.9–10.3)
Chloride: 100 mmol/L (ref 98–111)
Creatinine, Ser: 1.23 mg/dL (ref 0.61–1.24)
GFR calc Af Amer: >60 mL/min (ref >60)
GFR calc non Af Amer: >60 mL/min (ref >60)
Glucose, Bld: 115 mg/dL — ABNORMAL HIGH (ref 70–99)
Potassium: 4.7 mmol/L (ref 3.5–5.1)
Sodium: 135 mmol/L (ref 135–145)

## 2019-04-13 LAB — PROCALCITONIN: Procalcitonin: <0.1 ng/mL

## 2019-04-13 MED ORDER — FUROSEMIDE 10 MG/ML IJ SOLN
20.0000 mg | Freq: Once | INTRAMUSCULAR | Status: AC
  Administered 2019-04-13: 09:00:00 20 mg via INTRAVENOUS
  Filled 2019-04-13: qty 2

## 2019-04-13 MED ORDER — VANCOMYCIN HCL 2000 MG/400ML IV SOLN
2000.0000 mg | INTRAVENOUS | Status: AC
  Administered 2019-04-14 – 2019-04-15 (×2): 2000 mg via INTRAVENOUS
  Filled 2019-04-13 (×2): qty 400

## 2019-04-13 NOTE — Progress Notes (Signed)
RT NOTE:  Pt will call when ready for CPAP.  

## 2019-04-13 NOTE — Progress Notes (Signed)
Physical Therapy Treatment Patient Details Name: Sean Garza MRN: [id number] DOB: December 05, 1960 Today's Date: 04/13/2019    History of Present Illness 59 y.o. male past medical history of rheumatoid arthritis, DVT obstructive sleep apnea obesity presents to [facility] ED on 04/05/2019 with worsening shortness of breath, patient relates she started having symptoms about 6 days prior to admission in the ED was found to be hypoxic SARS-CoV-2 PCR was positive chest x-ray revealed bilateral infiltrates, he was started on IV remdesivir and steroids to eculizumab was administered on 04/05/2020 worsening inflammatory markers and hypoxia.    PT Comments    Pt highly motivated to improve and return to prior level of functioning. Pt on 6L/min via [place] and sats in 90s at rest. Pt increased to 8l/min with activity as was noted to desat to 70s on 6L. Pt able to ambulate to rest room and use rest room, did desat to 77% but once 02 increased to 8L/min pt able to sat in mid 80s to complete task. Ambulated back to room and completed seated rest with pursed lip breathing, sats increased to 90s w/ this. Pt ambulated approx 179ft with initially stand by assist and hand on side rail, on return to room pt needed hand held assist to complete distance. Pt desat to min of 81% with ambulation. Completed incentive spirometer and also flutter valve use.      Follow Up Recommendations  No PT follow up     Equipment Recommendations  None recommended by PT    Recommendations for Other Services       Precautions / Restrictions Precautions Precautions: Other (comment) Precaution Comments: desat with min activity Restrictions Weight Bearing Restrictions: No    Mobility  Bed Mobility               General bed mobility comments: pt sitting in recliner at therapist arrival  Transfers Overall transfer level: Needs assistance Equipment used: None Transfers: Sit to/from Stand;Stand Pivot Transfers Sit to Stand:  Supervision Stand pivot transfers: Supervision       General transfer comment: able to transfer from recliner and also commode in rest room with set up and line management help  Ambulation/Gait Ambulation/Gait assistance: Min guard;Supervision Gait Distance (Feet): 170 Feet Assistive device: None;1 person hand held assist Gait Pattern/deviations: Step-through pattern;Wide base of support Gait velocity: decreased   General Gait Details: ambulated down hall approx 112ft with initialy stand by assist and hand on side rail but one at hald way mark seemed to be fatigued hence needed hand held assist to make it back to room. Pt was determined to complete entire distance. with ambulation on 8L/min via HFNC pt min desat was 81%   Marine scientist [place] (Stroke Patients Only)       Balance Overall balance assessment: Mild deficits observed, not formally tested                                          Cognition Arousal/Alertness: Awake/alert Behavior During Therapy: WFL for tasks assessed/performed Overall Cognitive Status: Within Functional Limits for tasks assessed                                        Exercises  Other Exercises Other Exercises: incentive spirometer x 5 Other Exercises: flutter valve x 5    General Comments        Pertinent Vitals/Pain Pain Assessment: No/denies pain    Home Living                      Prior Function            PT Goals (current goals can now be found in the care plan section) Acute Rehab PT Goals Patient Stated Goal: pt up in chair and is excited to be able to ambulate in hall today PT Goal Formulation: With patient Time For Goal Achievement: 04/26/19 Potential to Achieve Goals: Good Progress towards PT goals: Progressing toward goals    Frequency    Min 3X/week      PT Plan Current plan remains appropriate    Co-evaluation               AM-PAC PT "6 Clicks" Mobility   Outcome Measure  Help needed turning from your back to your side while in a flat bed without using bedrails?: None Help needed moving from lying on your back to sitting on the side of a flat bed without using bedrails?: None Help needed moving to and from a bed to a chair (including a wheelchair)?: A Little Help needed standing up from a chair using your arms (e.g., wheelchair or bedside chair)?: None Help needed to walk in hospital room?: A Little Help needed climbing 3-5 steps with a railing? : A Little 6 Click Score: 21    End of Session Equipment Utilized During Treatment: Oxygen Activity Tolerance: Treatment limited secondary to medical complications (Comment) Patient left: in chair;with call bell/phone within reach Nurse Communication: Mobility status;Other (comment) PT Visit Diagnosis: Other abnormalities of gait and mobility (R26.89)     Time: [id number] PT Time Calculation (min) (ACUTE ONLY): 26 min  Charges:  $Gait Training: 8-22 mins $Therapeutic Activity: 8-22 mins                     Horald Chestnut, PT    Delford Field 04/13/2019, 1:31 PM

## 2019-04-13 NOTE — Progress Notes (Signed)
[facility] PROGRESS NOTE    Progress Note  Sean Garza  [id number] DOB: March 24, 1960 DOA: 04/05/2019 PCP: Sean Billet, MD     Brief Narrative:   Sean Garza is an 59 y.o. male past medical history of rheumatoid arthritis, DVT obstructive sleep apnea obesity presents to [facility] ED on 04/05/2019 with worsening shortness of breath, patient relates she started having symptoms about 6 days prior to admission in the ED was found to be hypoxic SARS-CoV-2 PCR was positive chest x-ray revealed bilateral infiltrates, she was started on IV remdesivir and steroids to eculizumab was administered on 04/05/2020 worsening inflammatory markers and hypoxia.  Assessment/Plan:   Acute respiratory failure with hypoxia/ Acute hypoxemic respiratory failure due to severe acute respiratory syndrome coronavirus 2 (SARS-CoV-2) disease ([place]) Patient was requiring anywhere 5 to 7 L of high flow oxygen nasal cannula to keep saturations greater than 90%.  He has been use his CPAP overnight. He has completed his course of IV remdesivir, will continue steroids for a total of 10 days, he is status post Actemra on 04/06/2019.  Started empirically on IV vancomycin and Rocephin due to his elevated white blood cell count of 29.  He has remained afebrile his leukocytosis is significantly improved today. Patient is not willing to prone I explained to him the risk and benefits of proning but he is adamant. Repeat a dose of IV Lasix, will continue to restrict his fluids.  Right pleural effusion: Resolved by chest x-ray on 04/08/2019.  Obstructive sleep apnea: Using his CPAP at night he relates he feels more comfortable with it.  Obesity, Class III, BMI 40-49.9 (morbid obesity) (HCC) Counseling.  Asthma Continue inhalers.   DVT prophylaxis: lovenox Family Communication:none Disposition Plan/Barrier to D/C: Home once he is off oxygen patient is not proning.  Code Status:     Code Status Orders  (From  admission, onward)         Start     Ordered   04/05/19 2140  Full code  Continuous     04/05/19 2141        Code Status History    Date Active Date Inactive Code Status Order ID Comments User Context   04/05/2019 1859 04/05/2019 2135 Full Code [id number]  Jennye Boroughs, MD ED   03/24/2017 1506 03/24/2017 1841 Full Code [id number]  Corky Mull, MD Inpatient   08/06/2015 1857 08/07/2015 1659 Full Code [id number]  Vaughan Basta, MD Inpatient   Advance Care Planning Activity        IV Access:    Peripheral IV   Procedures and diagnostic studies:   CT CHEST WO CONTRAST  Result Date: 04/11/2019 CLINICAL DATA:  Dyspnea on exertion. Pneumonia due to COVID-19 infection. EXAM: CT CHEST WITHOUT CONTRAST TECHNIQUE: Multidetector CT imaging of the chest was performed following the standard protocol without IV contrast. COMPARISON:  Chest radiographs, 04/08/2019.  Chest CTA, 08/06/2015. FINDINGS: Cardiovascular: Heart normal in size and configuration. No pericardial effusion or coronary artery calcifications. Normal great vessels. Mediastinum/Nodes: No enlarged mediastinal or axillary lymph nodes. Thyroid gland, trachea, and esophagus demonstrate no significant findings. Lungs/Pleura: There is hazy ground-glass opacity that extends throughout most of the lungs. There is no lung mass or nodule. No pleural effusion or pneumothorax. Airways are unremarkable. Upper Abdomen: Small hiatal hernia. No acute findings in the visualized upper abdomen. Musculoskeletal: No fracture or acute finding. No bone lesion. No chest wall mass. IMPRESSION: 1. Bilateral, extensive ground-glass lung opacities consistent with multifocal pneumonia due to COVID-19  infection. No other acute abnormality. 2. No evidence of pulmonary edema. 3. Small hiatal hernia. Electronically Signed   By: Lajean Manes M.D.   On: 04/11/2019 10:49     Medical Consultants:    None.  Anti-Infectives:   None  Subjective:     Sean Garza relates his breathing is unchanged compared to yesterday, but he has been having some abdominal cramps.  Objective:    Vitals:   04/12/19 1922 04/12/19 2224 04/12/19 2300 04/13/19 0354  BP: 116/79  100/67 (!) 98/43  Pulse: (!) 57 63 (!) 59 (!) 56  Resp: (!) 22 20 18 20   Temp: (!) 97.4 F (36.3 C)  (!) 97 F (36.1 C) (!) 97.1 F (36.2 C)  TempSrc: Oral  Oral Oral  SpO2: 95% 95% 90% 90%  Weight:      Height:       SpO2: 90 % O2 Flow Rate (L/min): 8 L/min   Intake/Output Summary (Last 24 hours) at 04/13/2019 0719 Last data filed at 04/12/2019 2300 Gross per 24 hour  Intake --  Output 2075 ml  Net -2075 ml   Filed Weights   04/05/19 2213  Weight: 133.8 kg    Exam: General exam: In no acute distress. Respiratory system: Good air movement and clear to auscultation. Cardiovascular system: S1 & S2 heard, RRR. No JVD. Gastrointestinal system: Abdomen is nondistended, soft and nontender.  Extremities: No pedal edema. Skin: No rashes, lesions or ulcers  Data Reviewed:    Labs: Basic Metabolic Panel: Recent Labs  Lab 04/08/19 0610 04/08/19 0610 04/09/19 0305 04/09/19 0305 04/10/19 0358 04/10/19 0358 04/11/19 0353 04/13/19 0127  NA 136  --  136  --  136  --  137 135  K 4.7   < > 5.0   < > 4.8   < > 4.4 4.7  CL 103  --  103  --  103  --  102 100  CO2 24  --  24  --  25  --  22 24  GLUCOSE 121*  --  137*  --  145*  --  128* 115*  BUN 38*  --  39*  --  35*  --  33* 41*  CREATININE 0.91  --  1.18  --  1.02  --  1.11 1.23  CALCIUM 8.7*  --  8.5*  --  8.4*  --  8.6* 8.0*   < > = values in this interval not displayed.   GFR Estimated Creatinine Clearance: 87.6 mL/min (by C-G formula based on SCr of 1.23 mg/dL). Liver Function Tests: Recent Labs  Lab 04/07/19 0009 04/08/19 0610 04/09/19 0305 04/10/19 0358 04/11/19 0353  AST 31 24 23 22 23   ALT 23 23 23 29 30   ALKPHOS 52 52 51 51 62  BILITOT 0.5 0.5 0.6 0.6 0.8  PROT 8.0 7.6 7.2 6.9 7.6   ALBUMIN 2.8* 2.8* 2.9* 2.7* 3.1*   No results for input(s): LIPASE, AMYLASE in the last 168 hours. No results for input(s): AMMONIA in the last 168 hours. Coagulation profile No results for input(s): INR, PROTIME in the last 168 hours. COVID-19 Labs  Recent Labs    04/11/19 0353  DDIMER 1.02*  CRP 1.5*    Lab Results  Component Value Date   SARSCOV2NAA Not Detected 09/13/2018    CBC: Recent Labs  Lab 04/07/19 0009 04/08/19 0610 04/11/19 0353  WBC 10.8* 14.1* 29.3*  NEUTROABS 9.9* 12.5* 23.5*  HGB 13.2 13.5 15.1  HCT 41.1 41.6  46.6  MCV 88.0 88.3 88.3  PLT 294 324 472*   Cardiac Enzymes: No results for input(s): CKTOTAL, CKMB, CKMBINDEX, TROPONINI in the last 168 hours. BNP (last 3 results) No results for input(s): PROBNP in the last 8760 hours. CBG: No results for input(s): GLUCAP in the last 168 hours. D-Dimer: Recent Labs    04/11/19 0353  DDIMER 1.02*   Hgb A1c: No results for input(s): HGBA1C in the last 72 hours. Lipid Profile: No results for input(s): CHOL, HDL, LDLCALC, TRIG, CHOLHDL, LDLDIRECT in the last 72 hours. Thyroid function studies: No results for input(s): TSH, T4TOTAL, T3FREE, THYROIDAB in the last 72 hours.  Invalid input(s): FREET3 Anemia work up: No results for input(s): VITAMINB12, FOLATE, FERRITIN, TIBC, IRON, RETICCTPCT in the last 72 hours. Sepsis Labs: Recent Labs  Lab 04/07/19 0009 04/08/19 0610 04/11/19 0353 04/12/19 0850 04/13/19 0127  PROCALCITON  --   --   --  <0.10 <0.10  WBC 10.8* 14.1* 29.3*  --   --    Microbiology No results found for this or any previous visit (from the past 240 hour(s)).   Medications:   . vitamin C  500 mg Oral Daily  . benzonatate  200 mg Oral TID  . dexamethasone  6 mg Oral Q24H  . docusate sodium  100 mg Oral Daily  . enoxaparin (LOVENOX) injection  65 mg Subcutaneous Q12H  . Ipratropium-Albuterol  1 puff Inhalation Q6H  . mometasone-formoterol  2 puff Inhalation BID  .  pantoprazole  40 mg Oral Daily  . polyethylene glycol  17 g Oral Daily  . tamsulosin  0.4 mg Oral QPC supper  . zinc sulfate  220 mg Oral Daily   Continuous Infusions: . cefTRIAXone (ROCEPHIN)  IV 2 g (04/12/19 1038)  . vancomycin 1,500 mg (04/13/19 0024)     LOS: 8 days   Charlynne Cousins  [facility]  04/13/2019, 7:19 AM

## 2019-04-14 LAB — CBC WITH DIFFERENTIAL/PLATELET
Abs Immature Granulocytes: 1.11 10*3/uL — ABNORMAL HIGH (ref 0.00–0.07)
Basophils Absolute: 0.1 10*3/uL (ref 0.0–0.1)
Basophils Relative: 0 %
Eosinophils Absolute: 0.1 10*3/uL (ref 0.0–0.5)
Eosinophils Relative: 0 %
HCT: 47.8 % (ref 39.0–52.0)
Hemoglobin: 15.7 g/dL (ref 13.0–17.0)
Immature Granulocytes: 6 %
Lymphocytes Relative: 4 %
Lymphs Abs: 0.7 10*3/uL (ref 0.7–4.0)
MCH: 29 pg (ref 26.0–34.0)
MCHC: 32.8 g/dL (ref 30.0–36.0)
MCV: 88.4 fL (ref 80.0–100.0)
Monocytes Absolute: 0.5 10*3/uL (ref 0.1–1.0)
Monocytes Relative: 3 %
Neutro Abs: 17 10*3/uL — ABNORMAL HIGH (ref 1.7–7.7)
Neutrophils Relative %: 87 %
Platelets: 327 10*3/uL (ref 150–400)
RBC: 5.41 MIL/uL (ref 4.22–5.81)
RDW: 16.4 % — ABNORMAL HIGH (ref 11.5–15.5)
WBC: 19.4 10*3/uL — ABNORMAL HIGH (ref 4.0–10.5)
nRBC: 0 % (ref 0.0–0.2)

## 2019-04-14 LAB — BASIC METABOLIC PANEL
Anion gap: 10 (ref 5–15)
BUN: 46 mg/dL — ABNORMAL HIGH (ref 6–20)
CO2: 26 mmol/L (ref 22–32)
Calcium: 8.4 mg/dL — ABNORMAL LOW (ref 8.9–10.3)
Chloride: 99 mmol/L (ref 98–111)
Creatinine, Ser: 1.04 mg/dL (ref 0.61–1.24)
GFR calc Af Amer: >60 mL/min (ref >60)
GFR calc non Af Amer: >60 mL/min (ref >60)
Glucose, Bld: 104 mg/dL — ABNORMAL HIGH (ref 70–99)
Potassium: 5 mmol/L (ref 3.5–5.1)
Sodium: 135 mmol/L (ref 135–145)

## 2019-04-14 LAB — PROCALCITONIN: Procalcitonin: <0.1 ng/mL

## 2019-04-14 MED ORDER — FUROSEMIDE 10 MG/ML IJ SOLN
20.0000 mg | Freq: Once | INTRAMUSCULAR | Status: AC
  Administered 2019-04-14: 20 mg via INTRAVENOUS
  Filled 2019-04-14: qty 2

## 2019-04-14 MED ORDER — FUROSEMIDE 10 MG/ML IJ SOLN
40.0000 mg | Freq: Once | INTRAMUSCULAR | Status: AC
  Administered 2019-04-14: 40 mg via INTRAVENOUS
  Filled 2019-04-14: qty 4

## 2019-04-14 NOTE — Plan of Care (Signed)
Patient sitting up in chair, has been most of the day. Reports he is comfortable. Maintaining o2 saturations >90% on 2 LPM nasal cannula. No complaints.

## 2019-04-14 NOTE — Progress Notes (Signed)
Physical Therapy Treatment Patient Details Name: Sean Garza MRN: [id number] DOB: 07-15-1960 Today's Date: 04/14/2019    History of Present Illness 59 y.o. male past medical history of rheumatoid arthritis, DVT obstructive sleep apnea obesity presents to [facility] ED on 04/05/2019 with worsening shortness of breath, patient relates she started having symptoms about 6 days prior to admission in the ED was found to be hypoxic SARS-CoV-2 PCR was positive chest x-ray revealed bilateral infiltrates, he was started on IV remdesivir and steroids to eculizumab was administered on 04/05/2020 worsening inflammatory markers and hypoxia.    PT Comments    Pt did well with mobility, pt was apprehensive about mobility needing increased time between activities to sit and complete pursed lip breathing. Pt was on 2L/min via [place] at rest and sats in 90s. With ambulation pt was increased first to 3L/min then 4l/min. On 3l/min was noted to desat to min 79% Pt ambulated approx 17ft with initially stand by assist then with hand help assist.     Follow Up Recommendations  No PT follow up     Equipment Recommendations  None recommended by PT    Recommendations for Other Services       Precautions / Restrictions Precautions Precautions: Other (comment) Precaution Comments: desat with min activity Restrictions Weight Bearing Restrictions: No    Mobility  Bed Mobility               General bed mobility comments: pt in recliner at therapist arrival  Transfers Overall transfer level: Modified independent Equipment used: None                Ambulation/Gait Ambulation/Gait assistance: Supervision;Min guard Gait Distance (Feet): 150 Feet Assistive device: None;1 person hand held assist Gait Pattern/deviations: Step-through pattern;Wide base of support Gait velocity: dec       Stairs             Wheelchair Mobility    Modified Rankin (Stroke Patients Only)       Balance  Overall balance assessment: Mild deficits observed, not formally tested                                          Cognition Arousal/Alertness: Awake/alert Behavior During Therapy: WFL for tasks assessed/performed Overall Cognitive Status: Within Functional Limits for tasks assessed                                        Exercises Other Exercises Other Exercises: incentive spirometer x 5 Other Exercises: flutter valve x 5    General Comments        Pertinent Vitals/Pain Pain Assessment: No/denies pain    Home Living                      Prior Function            PT Goals (current goals can now be found in the care plan section) Acute Rehab PT Goals Patient Stated Goal: satted had poor night and did not sleep much PT Goal Formulation: With patient Time For Goal Achievement: 04/26/19 Potential to Achieve Goals: Good Progress towards PT goals: Progressing toward goals    Frequency    Min 3X/week      PT Plan Current plan remains appropriate    Co-evaluation  AM-PAC PT "6 Clicks" Mobility   Outcome Measure  Help needed turning from your back to your side while in a flat bed without using bedrails?: None Help needed moving from lying on your back to sitting on the side of a flat bed without using bedrails?: None Help needed moving to and from a bed to a chair (including a wheelchair)?: A Little Help needed standing up from a chair using your arms (e.g., wheelchair or bedside chair)?: None Help needed to walk in hospital room?: A Little Help needed climbing 3-5 steps with a railing? : A Little 6 Click Score: 21    End of Session Equipment Utilized During Treatment: Oxygen Activity Tolerance: Treatment limited secondary to medical complications (Comment) Patient left: in chair;with call bell/phone within reach Nurse Communication: Mobility status PT Visit Diagnosis: Other abnormalities of gait and  mobility (R26.89)     Time: [id number] PT Time Calculation (min) (ACUTE ONLY): 35 min  Charges:  $Gait Training: 8-22 mins $Therapeutic Activity: 8-22 mins                     Horald Chestnut, PT    Delford Field 04/14/2019, 12:54 PM

## 2019-04-14 NOTE — Progress Notes (Signed)
Spoke with patient's brother, Gene Limb, via phone and updated on patient status. Encouraged and answered all questions.

## 2019-04-14 NOTE — Progress Notes (Signed)
[facility] PROGRESS NOTE    Progress Note  Sean Garza  [id number] DOB: 24-Jul-1960 DOA: 04/05/2019 PCP: Albina Billet, MD     Brief Narrative:   Sean Garza is an 59 y.o. male past medical history of rheumatoid arthritis, DVT obstructive sleep apnea obesity presents to [facility] ED on 04/05/2019 with worsening shortness of breath, patient relates she started having symptoms about 6 days prior to admission in the ED was found to be hypoxic SARS-CoV-2 PCR was positive chest x-ray revealed bilateral infiltrates, she was started on IV remdesivir and steroids to eculizumab was administered on 04/05/2020 worsening inflammatory markers and hypoxia.  Assessment/Plan:   Acute respiratory failure with hypoxia/ Acute hypoxemic respiratory failure due to severe acute respiratory syndrome coronavirus 2 (SARS-CoV-2) disease ([place]) Patient is requiring 5 L of oxygen to keep saturations greater 90%, he has been using his CPAP overnight. He completed his course of IV remdesivir, will continue steroids for no more than 10 days, he is status post Actemra on 04/06/2019. He was started on IV vancomycin and Rocephin due to his elevated white count, which has been trending down, and he has remained afebrile.  We will complete a 5-day course of IV empiric antibiotics. I explained to the patient several times that he needs to prone and use the incentive spirometry and flutter valve.  He is adamant to do so. We will continue IV Lasix daily, his blood pressure has been able to tolerate it, continue monitor electrolytes creatinine has remained stable.  Right pleural effusion: Resolved by chest x-ray on 04/08/2019.  Obstructive sleep apnea: Using his CPAP at night he relates he feels more comfortable with it.  Obesity, Class III, BMI 40-49.9 (morbid obesity) (HCC) Counseling.  Asthma Continue inhalers.   DVT prophylaxis: lovenox Family Communication:none Disposition Plan/Barrier to D/C: Home once he  is off oxygen patient is not proning.  Code Status:     Code Status Orders  (From admission, onward)         Start     Ordered   04/05/19 2140  Full code  Continuous     04/05/19 2141        Code Status History    Date Active Date Inactive Code Status Order ID Comments User Context   04/05/2019 1859 04/05/2019 2135 Full Code [id number]  Jennye Boroughs, MD ED   03/24/2017 1506 03/24/2017 1841 Full Code [id number]  Corky Mull, MD Inpatient   08/06/2015 1857 08/07/2015 1659 Full Code [id number]  Vaughan Basta, MD Inpatient   Advance Care Planning Activity        IV Access:    Peripheral IV   Procedures and diagnostic studies:   No results found.   Medical Consultants:    None.  Anti-Infectives:   None  Subjective:    Sean Garza relates his breathing is unchanged compared to yesterday, but he has been having some abdominal cramps.  Objective:    Vitals:   04/13/19 1600 04/13/19 1950 04/13/19 2355 04/14/19 0417  BP: 125/74 (!) 147/79 (!) 85/32 127/78  Pulse:  73 (!) 59 (!) 57  Resp: 18 20 20 20   Temp: 98.2 F (36.8 C) 98.8 F (37.1 C) 97.9 F (36.6 C) 97.9 F (36.6 C)  TempSrc: Oral Axillary Oral Axillary  SpO2: 91% 91% 94% (!) 88%  Weight:      Height:       SpO2: (!) 88 % O2 Flow Rate (L/min): 5 L/min   Intake/Output Summary (Last 24  hours) at 04/14/2019 0708 Last data filed at 04/14/2019 0500 Gross per 24 hour  Intake 1240 ml  Output 1750 ml  Net -510 ml   Filed Weights   04/05/19 2213  Weight: 133.8 kg    Exam: General exam: In no acute distress. Respiratory system: Good air movement and clear to auscultation. Cardiovascular system: S1 & S2 heard, RRR. No JVD. Gastrointestinal system: Abdomen is nondistended, soft and nontender.  Extremities: No pedal edema. Skin: No rashes, lesions or ulcers  Data Reviewed:    Labs: Basic Metabolic Panel: Recent Labs  Lab 04/09/19 0305 04/09/19 0305 04/10/19 0358  04/10/19 0358 04/11/19 0353 04/11/19 0353 04/13/19 0127 04/14/19 0025  NA 136  --  136  --  137  --  135 135  K 5.0   < > 4.8   < > 4.4   < > 4.7 5.0  CL 103  --  103  --  102  --  100 99  CO2 24  --  25  --  22  --  24 26  GLUCOSE 137*  --  145*  --  128*  --  115* 104*  BUN 39*  --  35*  --  33*  --  41* 46*  CREATININE 1.18  --  1.02  --  1.11  --  1.23 1.04  CALCIUM 8.5*  --  8.4*  --  8.6*  --  8.0* 8.4*   < > = values in this interval not displayed.   GFR Estimated Creatinine Clearance: 103.6 mL/min (by C-G formula based on SCr of 1.04 mg/dL). Liver Function Tests: Recent Labs  Lab 04/08/19 0610 04/09/19 0305 04/10/19 0358 04/11/19 0353  AST 24 23 22 23   ALT 23 23 29 30   ALKPHOS 52 51 51 62  BILITOT 0.5 0.6 0.6 0.8  PROT 7.6 7.2 6.9 7.6  ALBUMIN 2.8* 2.9* 2.7* 3.1*   No results for input(s): LIPASE, AMYLASE in the last 168 hours. No results for input(s): AMMONIA in the last 168 hours. Coagulation profile No results for input(s): INR, PROTIME in the last 168 hours. COVID-19 Labs  No results for input(s): DDIMER, FERRITIN, LDH, CRP in the last 72 hours.  Lab Results  Component Value Date   [place] Not Detected 09/13/2018    CBC: Recent Labs  Lab 04/08/19 0610 04/11/19 0353 04/13/19 0127 04/14/19 0025  WBC 14.1* 29.3* 20.9* 19.4*  NEUTROABS 12.5* 23.5* 17.6* 17.0*  HGB 13.5 15.1 14.3 15.7  HCT 41.6 46.6 44.6 47.8  MCV 88.3 88.3 89.2 88.4  PLT 324 472* 361 327   Cardiac Enzymes: No results for input(s): CKTOTAL, CKMB, CKMBINDEX, TROPONINI in the last 168 hours. BNP (last 3 results) No results for input(s): PROBNP in the last 8760 hours. CBG: No results for input(s): GLUCAP in the last 168 hours. D-Dimer: No results for input(s): DDIMER in the last 72 hours. Hgb A1c: No results for input(s): HGBA1C in the last 72 hours. Lipid Profile: No results for input(s): CHOL, HDL, LDLCALC, TRIG, CHOLHDL, LDLDIRECT in the last 72 hours. Thyroid function  studies: No results for input(s): TSH, T4TOTAL, T3FREE, THYROIDAB in the last 72 hours.  Invalid input(s): FREET3 Anemia work up: No results for input(s): VITAMINB12, FOLATE, FERRITIN, TIBC, IRON, RETICCTPCT in the last 72 hours. Sepsis Labs: Recent Labs  Lab 04/08/19 0610 04/11/19 0353 04/12/19 0850 04/13/19 0127 04/14/19 0025  PROCALCITON  --   --  <0.10 <0.10 <0.10  WBC 14.1* 29.3*  --  20.9* 19.4*  Microbiology No results found for this or any previous visit (from the past 240 hour(s)).   Medications:   . vitamin C  500 mg Oral Daily  . benzonatate  200 mg Oral TID  . dexamethasone  6 mg Oral Q24H  . docusate sodium  100 mg Oral Daily  . enoxaparin (LOVENOX) injection  65 mg Subcutaneous Q12H  . Ipratropium-Albuterol  1 puff Inhalation Q6H  . mometasone-formoterol  2 puff Inhalation BID  . pantoprazole  40 mg Oral Daily  . polyethylene glycol  17 g Oral Daily  . tamsulosin  0.4 mg Oral QPC supper  . zinc sulfate  220 mg Oral Daily   Continuous Infusions: . cefTRIAXone (ROCEPHIN)  IV Stopped (04/13/19 1045)  . vancomycin 2,000 mg (04/14/19 0202)     LOS: 9 days   Charlynne Cousins  [facility]  04/14/2019, 7:08 AM

## 2019-04-14 NOTE — Progress Notes (Signed)
Pt removed from CPAP per his request and placed on 5L [place]

## 2019-04-15 LAB — BASIC METABOLIC PANEL
Anion gap: 11 (ref 5–15)
BUN: 52 mg/dL — ABNORMAL HIGH (ref 6–20)
CO2: 25 mmol/L (ref 22–32)
Calcium: 8.5 mg/dL — ABNORMAL LOW (ref 8.9–10.3)
Chloride: 99 mmol/L (ref 98–111)
Creatinine, Ser: 1.11 mg/dL (ref 0.61–1.24)
GFR calc Af Amer: >60 mL/min (ref >60)
GFR calc non Af Amer: >60 mL/min (ref >60)
Glucose, Bld: 108 mg/dL — ABNORMAL HIGH (ref 70–99)
Potassium: 4.8 mmol/L (ref 3.5–5.1)
Sodium: 135 mmol/L (ref 135–145)

## 2019-04-15 LAB — CBC WITH DIFFERENTIAL/PLATELET
Abs Immature Granulocytes: 0.9 10*3/uL — ABNORMAL HIGH (ref 0.00–0.07)
Basophils Absolute: 0.1 10*3/uL (ref 0.0–0.1)
Basophils Relative: 0 %
Eosinophils Absolute: 0 10*3/uL (ref 0.0–0.5)
Eosinophils Relative: 0 %
HCT: 47.5 % (ref 39.0–52.0)
Hemoglobin: 15.5 g/dL (ref 13.0–17.0)
Immature Granulocytes: 5 %
Lymphocytes Relative: 4 %
Lymphs Abs: 0.6 10*3/uL — ABNORMAL LOW (ref 0.7–4.0)
MCH: 28.5 pg (ref 26.0–34.0)
MCHC: 32.6 g/dL (ref 30.0–36.0)
MCV: 87.3 fL (ref 80.0–100.0)
Monocytes Absolute: 0.5 10*3/uL (ref 0.1–1.0)
Monocytes Relative: 3 %
Neutro Abs: 15.4 10*3/uL — ABNORMAL HIGH (ref 1.7–7.7)
Neutrophils Relative %: 88 %
Platelets: 331 10*3/uL (ref 150–400)
RBC: 5.44 MIL/uL (ref 4.22–5.81)
RDW: 15.8 % — ABNORMAL HIGH (ref 11.5–15.5)
WBC: 17.5 10*3/uL — ABNORMAL HIGH (ref 4.0–10.5)
nRBC: 0 % (ref 0.0–0.2)

## 2019-04-15 MED ORDER — FUROSEMIDE 20 MG PO TABS
20.0000 mg | ORAL_TABLET | Freq: Every day | ORAL | Status: DC
  Administered 2019-04-15 – 2019-04-16 (×2): 20 mg via ORAL
  Filled 2019-04-15 (×2): qty 1

## 2019-04-15 NOTE — Progress Notes (Signed)
[facility] PROGRESS NOTE    Progress Note  Sean Garza  [id number] DOB: [birth year] DOA: 04/05/2019 PCP: Albina Billet, MD     Brief Narrative:   Sean Garza is an 59 y.o. male past medical history of rheumatoid arthritis, DVT obstructive sleep apnea obesity presents to [facility] ED on 04/05/2019 with worsening shortness of breath, patient relates she started having symptoms about 6 days prior to admission in the ED was found to be hypoxic SARS-CoV-2 PCR was positive chest x-ray revealed bilateral infiltrates, she was started on IV remdesivir and steroids to eculizumab was administered on 04/05/2020 worsening inflammatory markers and hypoxia.  Assessment/Plan:   Acute respiratory failure with hypoxia/ Acute hypoxemic respiratory failure due to severe acute respiratory syndrome coronavirus 2 (SARS-CoV-2) disease ([place]) Patient is requiring 2 L of oxygen to keep saturations greater 90%, he has been using his CPAP overnight. He has completed his course of IV remdesivir, today will be his last day of oral steroids. He did receive a dose of IV Actemra on 04/06/2019. As his oxygen requirements were increased, will mild leukocytosis and he related not feeling well and his procalcitonin was slightly elevated he was started on IV vancomycin and Rocephin which she has completed his course in-house. Continue Lasix daily.  Right pleural effusion: Resolved by chest x-ray on 04/08/2019.  Obstructive sleep apnea: Using his CPAP at night he relates he feels more comfortable with it.  Obesity, Class III, BMI 40-49.9 (morbid obesity) (HCC) Counseling.  Asthma Continue inhalers.   DVT prophylaxis: lovenox Family Communication:none Disposition Plan/Barrier to D/C: Home once he is off oxygen patient is not proning.  Code Status:     Code Status Orders  (From admission, onward)         Start     Ordered   04/05/19 2140  Full code  Continuous     04/05/19 2141        Code Status  History    Date Active Date Inactive Code Status Order ID Comments User Context   04/05/2019 1859 04/05/2019 2135 Full Code [id number]  Jennye Boroughs, MD ED   03/24/2017 1506 03/24/2017 1841 Full Code [id number]  Corky Mull, MD Inpatient   08/06/2015 1857 08/07/2015 1659 Full Code [id number]  Vaughan Basta, MD Inpatient   Advance Care Planning Activity        IV Access:    Peripheral IV   Procedures and diagnostic studies:   No results found.   Medical Consultants:    None.  Anti-Infectives:   None  Subjective:    Sean Garza relates his breathing is improved compared to yesterday he is having some abdominal wall cramps. Objective:    Vitals:   04/14/19 1925 04/14/19 2335 04/15/19 0400 04/15/19 0806  BP: 127/81 139/87 111/69 111/84  Pulse: 72 70 (!) 58 62  Resp: (!) 22 20 20    Temp: 98.1 F (36.7 C) 98.2 F (36.8 C) 98.1 F (36.7 C) 98 F (36.7 C)  TempSrc: Axillary Axillary Axillary Oral  SpO2: 95% 93% 92% 92%  Weight:      Height:       SpO2: 92 % O2 Flow Rate (L/min): 2 L/min   Intake/Output Summary (Last 24 hours) at 04/15/2019 0816 Last data filed at 04/14/2019 1925 Gross per 24 hour  Intake 660 ml  Output 2425 ml  Net -1765 ml   Filed Weights   04/05/19 2213  Weight: 133.8 kg    Exam: General exam: In no acute  distress. Respiratory system: Good air movement and clear to auscultation. Cardiovascular system: S1 & S2 heard, RRR. No JVD. Gastrointestinal system: Abdomen is nondistended, soft and nontender.  Extremities: No pedal edema. Skin: No rashes, lesions or ulcers  Data Reviewed:    Labs: Basic Metabolic Panel: Recent Labs  Lab 04/10/19 0358 04/10/19 0358 04/11/19 0353 04/11/19 0353 04/13/19 0127 04/13/19 0127 04/14/19 0025 04/15/19 0030  NA 136  --  137  --  135  --  135 135  K 4.8   < > 4.4   < > 4.7   < > 5.0 4.8  CL 103  --  102  --  100  --  99 99  CO2 25  --  22  --  24  --  26 25  GLUCOSE 145*  --   128*  --  115*  --  104* 108*  BUN 35*  --  33*  --  41*  --  46* 52*  CREATININE 1.02  --  1.11  --  1.23  --  1.04 1.11  CALCIUM 8.4*  --  8.6*  --  8.0*  --  8.4* 8.5*   < > = values in this interval not displayed.   GFR Estimated Creatinine Clearance: 97.1 mL/min (by C-G formula based on SCr of 1.11 mg/dL). Liver Function Tests: Recent Labs  Lab 04/09/19 0305 04/10/19 0358 04/11/19 0353  AST 23 22 23   ALT 23 29 30   ALKPHOS 51 51 62  BILITOT 0.6 0.6 0.8  PROT 7.2 6.9 7.6  ALBUMIN 2.9* 2.7* 3.1*   No results for input(s): LIPASE, AMYLASE in the last 168 hours. No results for input(s): AMMONIA in the last 168 hours. Coagulation profile No results for input(s): INR, PROTIME in the last 168 hours. COVID-19 Labs  No results for input(s): DDIMER, FERRITIN, LDH, CRP in the last 72 hours.  Lab Results  Component Value Date   [place] Not Detected 09/13/2018    CBC: Recent Labs  Lab 04/11/19 0353 04/13/19 0127 04/14/19 0025 04/15/19 0030  WBC 29.3* 20.9* 19.4* 17.5*  NEUTROABS 23.5* 17.6* 17.0* 15.4*  HGB 15.1 14.3 15.7 15.5  HCT 46.6 44.6 47.8 47.5  MCV 88.3 89.2 88.4 87.3  PLT 472* 361 327 331   Cardiac Enzymes: No results for input(s): CKTOTAL, CKMB, CKMBINDEX, TROPONINI in the last 168 hours. BNP (last 3 results) No results for input(s): PROBNP in the last 8760 hours. CBG: No results for input(s): GLUCAP in the last 168 hours. D-Dimer: No results for input(s): DDIMER in the last 72 hours. Hgb A1c: No results for input(s): HGBA1C in the last 72 hours. Lipid Profile: No results for input(s): CHOL, HDL, LDLCALC, TRIG, CHOLHDL, LDLDIRECT in the last 72 hours. Thyroid function studies: No results for input(s): TSH, T4TOTAL, T3FREE, THYROIDAB in the last 72 hours.  Invalid input(s): FREET3 Anemia work up: No results for input(s): VITAMINB12, FOLATE, FERRITIN, TIBC, IRON, RETICCTPCT in the last 72 hours. Sepsis Labs: Recent Labs  Lab 04/11/19 0353  04/12/19 0850 04/13/19 0127 04/14/19 0025 04/15/19 0030  PROCALCITON  --  <0.10 <0.10 <0.10  --   WBC 29.3*  --  20.9* 19.4* 17.5*   Microbiology No results found for this or any previous visit (from the past 240 hour(s)).   Medications:   . vitamin C  500 mg Oral Daily  . benzonatate  200 mg Oral TID  . dexamethasone  6 mg Oral Q24H  . docusate sodium  100 mg Oral Daily  .  enoxaparin (LOVENOX) injection  65 mg Subcutaneous Q12H  . Ipratropium-Albuterol  1 puff Inhalation Q6H  . mometasone-formoterol  2 puff Inhalation BID  . pantoprazole  40 mg Oral Daily  . polyethylene glycol  17 g Oral Daily  . tamsulosin  0.4 mg Oral QPC supper  . zinc sulfate  220 mg Oral Daily   Continuous Infusions: . cefTRIAXone (ROCEPHIN)  IV Stopped (04/14/19 1029)     LOS: 10 days   Charlynne Cousins  [facility]  04/15/2019, 8:16 AM

## 2019-04-15 NOTE — Progress Notes (Signed)
Came to place patient on CPAP and he stated he started feeling like it was suffocating him last night so he does not want to wear it any longer while here.

## 2019-04-16 LAB — CBC WITH DIFFERENTIAL/PLATELET
Abs Immature Granulocytes: 0.34 10*3/uL — ABNORMAL HIGH (ref 0.00–0.07)
Basophils Absolute: 0 10*3/uL (ref 0.0–0.1)
Basophils Relative: 0 %
Eosinophils Absolute: 0.3 10*3/uL (ref 0.0–0.5)
Eosinophils Relative: 2 %
HCT: 46.2 % (ref 39.0–52.0)
Hemoglobin: 15.2 g/dL (ref 13.0–17.0)
Immature Granulocytes: 2 %
Lymphocytes Relative: 10 %
Lymphs Abs: 1.4 10*3/uL (ref 0.7–4.0)
MCH: 29.1 pg (ref 26.0–34.0)
MCHC: 32.9 g/dL (ref 30.0–36.0)
MCV: 88.5 fL (ref 80.0–100.0)
Monocytes Absolute: 0.9 10*3/uL (ref 0.1–1.0)
Monocytes Relative: 7 %
Neutro Abs: 10.9 10*3/uL — ABNORMAL HIGH (ref 1.7–7.7)
Neutrophils Relative %: 79 %
Platelets: 271 10*3/uL (ref 150–400)
RBC: 5.22 MIL/uL (ref 4.22–5.81)
RDW: 15.9 % — ABNORMAL HIGH (ref 11.5–15.5)
WBC: 13.9 10*3/uL — ABNORMAL HIGH (ref 4.0–10.5)
nRBC: 0 % (ref 0.0–0.2)

## 2019-04-16 NOTE — Progress Notes (Addendum)
[facility] PROGRESS NOTE    Progress Note  PIETER BACHAND  [id number] DOB: 1960-11-02 DOA: 04/05/2019 PCP: Albina Billet, MD     Brief Narrative:   NEVIL ANGER is an 59 y.o. male past medical history of rheumatoid arthritis, DVT obstructive sleep apnea obesity presents to [facility] ED on 04/05/2019 with worsening shortness of breath, patient relates she started having symptoms about 6 days prior to admission in the ED was found to be hypoxic SARS-CoV-2 PCR was positive chest x-ray revealed bilateral infiltrates, she was started on IV remdesivir and steroids to eculizumab was administered on 04/05/2020 worsening inflammatory markers and hypoxia.  Assessment/Plan:   Acute respiratory failure with hypoxia/ Acute hypoxemic respiratory failure due to severe acute respiratory syndrome coronavirus 2 (SARS-CoV-2) disease [facility]) Mr. Tripodi is currently requiring 2 L of oxygen to keep saturations greater than 90%, he has been using his CPAP every night.  He relates he does not feel well, he feels significantly dyspneic on exertion He has completed his course of IV remdesivir and steroids.  He did receive a dose of IV Actemra on 04/06/2019. He completed his course of on IV vancomycin and Rocephin for which she completed his course in house Blood pressure stable continue Lasix.  Physical therapy evaluated the patient recommended no further PT follow-up.  Right pleural effusion: Resolved by chest x-ray on 04/08/2019.  Obstructive sleep apnea: He continues to use CPAP at night he feels comfortable with it.  Obesity, Class III, BMI 40-49.9 (morbid obesity) (HCC) Counseling.  Asthma Continue inhalers.   DVT prophylaxis: lovenox Family Communication:none Disposition Plan/Barrier to D/C: Hopefully home in the morning  Code Status:     Code Status Orders  (From admission, onward)         Start     Ordered   04/05/19 2140  Full code  Continuous     04/05/19 2141        Code  Status History    Date Active Date Inactive Code Status Order ID Comments User Context   04/05/2019 1859 04/05/2019 2135 Full Code [id number]  Jennye Boroughs, MD ED   03/24/2017 1506 03/24/2017 1841 Full Code [id number]  Corky Mull, MD Inpatient   08/06/2015 1857 08/07/2015 1659 Full Code [id number]  Vaughan Basta, MD Inpatient   Advance Care Planning Activity        IV Access:    Peripheral IV   Procedures and diagnostic studies:   No results found.   Medical Consultants:    None.  Anti-Infectives:   None  Subjective:    [name] no further abdominal cramps but he does feel winded. Objective:    Vitals:   04/15/19 0806 04/15/19 1649 04/15/19 2100 04/16/19 0437  BP: 111/84 122/82 (!) 103/57 103/62  Pulse: 62 69 65 64  Resp:  20 18 20   Temp: 98 F (36.7 C) 98.5 F (36.9 C) 97.9 F (36.6 C) 98.1 F (36.7 C)  TempSrc: Oral Oral Axillary Axillary  SpO2: 92% 92% 90% 90%  Weight:      Height:       SpO2: 90 % O2 Flow Rate (L/min): 2 L/min   Intake/Output Summary (Last 24 hours) at 04/16/2019 0703 Last data filed at 04/15/2019 1800 Gross per 24 hour  Intake --  Output 1300 ml  Net -1300 ml   Filed Weights   04/05/19 2213  Weight: 133.8 kg    Exam: General exam: In no acute distress, but appears tired and fatigue Respiratory  system: Good air movement and diffuse crackles bilaterally Cardiovascular system: S1 & S2 heard, RRR. No JVD. Gastrointestinal system: Abdomen is nondistended, soft and nontender.  Extremities: No pedal edema. Skin: No rashes, lesions or ulcers  Data Reviewed:    Labs: Basic Metabolic Panel: Recent Labs  Lab 04/10/19 0358 04/10/19 0358 04/11/19 0353 04/11/19 0353 04/13/19 0127 04/13/19 0127 04/14/19 0025 04/15/19 0030  NA 136  --  137  --  135  --  135 135  K 4.8   < > 4.4   < > 4.7   < > 5.0 4.8  CL 103  --  102  --  100  --  99 99  CO2 25  --  22  --  24  --  26 25  GLUCOSE 145*  --  128*  --  115*   --  104* 108*  BUN 35*  --  33*  --  41*  --  46* 52*  CREATININE 1.02  --  1.11  --  1.23  --  1.04 1.11  CALCIUM 8.4*  --  8.6*  --  8.0*  --  8.4* 8.5*   < > = values in this interval not displayed.   GFR Estimated Creatinine Clearance: 97.1 mL/min (by C-G formula based on SCr of 1.11 mg/dL). Liver Function Tests: Recent Labs  Lab 04/10/19 0358 04/11/19 0353  AST 22 23  ALT 29 30  ALKPHOS 51 62  BILITOT 0.6 0.8  PROT 6.9 7.6  ALBUMIN 2.7* 3.1*   No results for input(s): LIPASE, AMYLASE in the last 168 hours. No results for input(s): AMMONIA in the last 168 hours. Coagulation profile No results for input(s): INR, PROTIME in the last 168 hours. COVID-19 Labs  No results for input(s): DDIMER, FERRITIN, LDH, CRP in the last 72 hours.  Lab Results  Component Value Date   Emmet Not Detected 09/13/2018    CBC: Recent Labs  Lab 04/11/19 0353 04/13/19 0127 04/14/19 0025 04/15/19 0030 04/16/19 0558  WBC 29.3* 20.9* 19.4* 17.5* 13.9*  NEUTROABS 23.5* 17.6* 17.0* 15.4* 10.9*  HGB 15.1 14.3 15.7 15.5 15.2  HCT 46.6 44.6 47.8 47.5 46.2  MCV 88.3 89.2 88.4 87.3 88.5  PLT 472* 361 327 331 271   Cardiac Enzymes: No results for input(s): CKTOTAL, CKMB, CKMBINDEX, TROPONINI in the last 168 hours. BNP (last 3 results) No results for input(s): PROBNP in the last 8760 hours. CBG: No results for input(s): GLUCAP in the last 168 hours. D-Dimer: No results for input(s): DDIMER in the last 72 hours. Hgb A1c: No results for input(s): HGBA1C in the last 72 hours. Lipid Profile: No results for input(s): CHOL, HDL, LDLCALC, TRIG, CHOLHDL, LDLDIRECT in the last 72 hours. Thyroid function studies: No results for input(s): TSH, T4TOTAL, T3FREE, THYROIDAB in the last 72 hours.  Invalid input(s): FREET3 Anemia work up: No results for input(s): VITAMINB12, FOLATE, FERRITIN, TIBC, IRON, RETICCTPCT in the last 72 hours. Sepsis Labs: Recent Labs  Lab 04/11/19 0353  04/12/19 0850 04/13/19 0127 04/14/19 0025 04/15/19 0030 04/16/19 0558  PROCALCITON  --  <0.10 <0.10 <0.10  --   --   WBC   < >  --  20.9* 19.4* 17.5* 13.9*   < > = values in this interval not displayed.   Microbiology No results found for this or any previous visit (from the past 240 hour(s)).   Medications:   . vitamin C  500 mg Oral Daily  . benzonatate  200 mg Oral  TID  . docusate sodium  100 mg Oral Daily  . enoxaparin (LOVENOX) injection  65 mg Subcutaneous Q12H  . furosemide  20 mg Oral Daily  . Ipratropium-Albuterol  1 puff Inhalation Q6H  . mometasone-formoterol  2 puff Inhalation BID  . pantoprazole  40 mg Oral Daily  . polyethylene glycol  17 g Oral Daily  . tamsulosin  0.4 mg Oral QPC supper  . zinc sulfate  220 mg Oral Daily   Continuous Infusions: . cefTRIAXone (ROCEPHIN)  IV Stopped (04/14/19 1029)     LOS: 11 days   Charlynne Cousins  [facility]  04/16/2019, 7:03 AM

## 2019-04-16 NOTE — Progress Notes (Signed)
RT NOTE:  Pt states he doesn't plan to wear CPAP during his remaining time here.

## 2019-04-16 NOTE — Progress Notes (Signed)
PT Cancellation Note  Patient Details Name: Sean Garza MRN: [id number] DOB: 07/20/1960   Cancelled Treatment:    Reason Eval/Treat Not Completed: Patient declined, no reason specified, Mobility tech will check with patient later to ambulate. PT will check back another time.   Claretha Cooper 04/16/2019, 10:30 AM   [place]  Office [phone]

## 2019-04-17 LAB — CBC WITH DIFFERENTIAL/PLATELET
Abs Immature Granulocytes: 0.21 10*3/uL — ABNORMAL HIGH (ref 0.00–0.07)
Basophils Absolute: 0 10*3/uL (ref 0.0–0.1)
Basophils Relative: 0 %
Eosinophils Absolute: 0.3 10*3/uL (ref 0.0–0.5)
Eosinophils Relative: 3 %
HCT: 46 % (ref 39.0–52.0)
Hemoglobin: 15.4 g/dL (ref 13.0–17.0)
Immature Granulocytes: 2 %
Lymphocytes Relative: 13 %
Lymphs Abs: 1.4 10*3/uL (ref 0.7–4.0)
MCH: 29.1 pg (ref 26.0–34.0)
MCHC: 33.5 g/dL (ref 30.0–36.0)
MCV: 87 fL (ref 80.0–100.0)
Monocytes Absolute: 0.9 10*3/uL (ref 0.1–1.0)
Monocytes Relative: 8 %
Neutro Abs: 8.2 10*3/uL — ABNORMAL HIGH (ref 1.7–7.7)
Neutrophils Relative %: 74 %
Platelets: 271 10*3/uL (ref 150–400)
RBC: 5.29 MIL/uL (ref 4.22–5.81)
RDW: 16 % — ABNORMAL HIGH (ref 11.5–15.5)
WBC: 11.1 10*3/uL — ABNORMAL HIGH (ref 4.0–10.5)
nRBC: 0 % (ref 0.0–0.2)

## 2019-04-17 NOTE — Progress Notes (Signed)
Physical Therapy Treatment Patient Details Name: Sean Garza MRN: [id number] DOB: Nov 06, 1960 Today's Date: 04/17/2019    History of Present Illness 59 y.o. male past medical history of rheumatoid arthritis, DVT obstructive sleep apnea obesity [facility] ED on 04/05/2019 with worsening shortness of breath, patient relates she started having symptoms about 6 days prior to admission in the ED was found to be hypoxic SARS-CoV-2 PCR was positive chest x-ray revealed bilateral infiltrates, he was started on IV remdesivir and steroids to eculizumab was administered on 04/05/2020 worsening inflammatory markers and hypoxia.    PT Comments    Patient provided written HEP, reviewed use of flutter and IS. Patient ambulated and requires Oxygen Refer to saturation walk test note-separate. The patient ios looking forward to DC.   Follow Up Recommendations  No PT follow up     Equipment Recommendations  None recommended by PT    Recommendations for Other Services       Precautions / Restrictions Precautions Precaution Comments: desat with min activity    Mobility  Bed Mobility               General bed mobility comments: pt in recliner at therapist arrival  Transfers Overall transfer level: Modified independent               General transfer comment: eases up from surface slowly and relies on UE support  Ambulation/Gait Ambulation/Gait assistance: Min guard Gait Distance (Feet): 100 Feet Assistive device: None;1 person hand held assist;IV Pole Gait Pattern/deviations: Step-through pattern;Drifts right/left Gait velocity: dec   General Gait Details: initially and intermittently required UE support, reaching for wall, door . patient more secure with 1 UE suport.   Stairs             Wheelchair Mobility    Modified Rankin (Stroke Patients Only)       Balance Overall balance assessment: Mild deficits observed, not formally tested                                           Cognition Arousal/Alertness: Awake/alert Behavior During Therapy: WFL for tasks assessed/performed Overall Cognitive Status: Within Functional Limits for tasks assessed                                 General Comments: tearful at times when speaking about his illness and family members      Exercises Other Exercises Other Exercises: incentive spirometer x 5 Other Exercises: flutter valve x 5- Use it first then IS Other Exercises: UE TB program Other Exercises: LE standing exercise program demonstrated- too SOB to perform    General Comments        Pertinent Vitals/Pain Pain Assessment: No/denies pain    Home Living                      Prior Function            PT Goals (current goals can now be found in the care plan section) Progress towards PT goals: Progressing toward goals    Frequency    Min 3X/week      PT Plan Current plan remains appropriate    Co-evaluation              AM-PAC PT "6 Clicks" Mobility  Outcome Measure  Help needed turning from your back to your side while in a flat bed without using bedrails?: None Help needed moving from lying on your back to sitting on the side of a flat bed without using bedrails?: None Help needed moving to and from a bed to a chair (including a wheelchair)?: A Little Help needed standing up from a chair using your arms (e.g., wheelchair or bedside chair)?: A Little Help needed to walk in hospital room?: A Little Help needed climbing 3-5 steps with a railing? : A Little 6 Click Score: 20    End of Session Equipment Utilized During Treatment: Oxygen Activity Tolerance: Treatment limited secondary to medical complications (Comment) Patient left: in chair;with call bell/phone within reach Nurse Communication: Mobility status PT Visit Diagnosis: Other abnormalities of gait and mobility (R26.89)     Time: [id number] PT Time Calculation (min) (ACUTE  ONLY): 53 min  Charges:  $Gait Training: 23-37 mins $Self Care/Home Management: [place]  Office [phone]    Claretha Cooper 04/17/2019, 1:43 PM

## 2019-04-17 NOTE — Plan of Care (Signed)
  Problem: Education: Goal: Knowledge of risk factors and measures for prevention of condition will improve Outcome: Progressing   Problem: Respiratory: Goal: Will maintain a patent airway Outcome: Progressing   Problem: Clinical Measurements: Goal: Will remain free from infection Outcome: Progressing   Problem: Clinical Measurements: Goal: Will remain free from infection Outcome: Progressing   Problem: Clinical Measurements: Goal: Respiratory complications will improve Outcome: Progressing   Problem: Activity: Goal: Risk for activity intolerance will decrease Outcome: Progressing    Problem: Safety: Goal: Ability to remain free from injury will improve Outcome: Progressing   Problem: Skin Integrity: Goal: Risk for impaired skin integrity will decrease Outcome: Progressing

## 2019-04-17 NOTE — TOC Initial Note (Signed)
Transition of Care [facility]) - Initial/Assessment Note    Patient Details  Name: Sean Garza MRN: [id number] Date of Birth: [birth year]  Transition of Care [facility]) CM/SW Contact:    Leeroy Cha, RN Phone Number: 04/17/2019, 9:16 AM  Clinical Narrative:                 tct-[facility] [place] will get o2 need parameters.  Floor RN notified..  Expected Discharge Plan: Home/Self Care Barriers to Discharge: No Barriers Identified   Patient Goals and CMS Choice        Expected Discharge Plan and Services Expected Discharge Plan: Home/Self Care   Discharge Planning Services: CM Consult Post Acute Care Choice: Durable Medical Equipment Living arrangements for the past 2 months: Single Family Home Expected Discharge Date: 04/17/19               DME Arranged: Oxygen DME Agency: [facility] Date DME Agency Contacted: 04/17/19 Time DME Agency Contacted: [phone] Representative spoke with at DME Agency: [place]            Prior Living Arrangements/Services Living arrangements for the past 2 months: [place] Lives with:: Significant Other Patient language and need for interpreter reviewed:: No        Need for Family Participation in Patient Care: Yes (Comment) Care giver support system in place?: Yes (comment)   Criminal Activity/Legal Involvement Pertinent to Current Situation/Hospitalization: No - Comment as needed  Activities of Daily Living Home Assistive Devices/Equipment: None ADL Screening (condition at time of admission) Patient's cognitive ability adequate to safely complete daily activities?: Yes Is the patient deaf or have difficulty hearing?: No Does the patient have difficulty seeing, even when wearing glasses/contacts?: No Does the patient have difficulty concentrating, remembering, or making decisions?: No Patient able to express need for assistance with ADLs?: Yes Does the patient have difficulty dressing or bathing?: No Independently performs ADLs?: Yes  (appropriate for developmental age) Does the patient have difficulty walking or climbing stairs?: No Weakness of Legs: None Weakness of Arms/Hands: None  Permission Sought/Granted                  Emotional Assessment Appearance:: Appears stated age     Orientation: : Oriented to Self, Oriented to Place, Oriented to  Time, Oriented to Situation Alcohol / Substance Use: Not Applicable Psych Involvement: No (comment)  Admission diagnosis:  Acute hypoxemic respiratory failure due to severe acute respiratory syndrome coronavirus 2 (SARS-CoV-2) disease ([place]) [U07.1, J96.01] Patient Active Problem List   Diagnosis Date Noted  . Pneumonia due to COVID-19 virus 04/05/2019  . Acute hypoxemic respiratory failure ([place]) 04/05/2019  . Acute hypoxemic respiratory failure due to severe acute respiratory syndrome coronavirus 2 (SARS-CoV-2) disease ([place]) 04/05/2019  . Sleep apnea   . Asthma   . Swelling   . History of DVT (deep vein thrombosis)   . Family history of coronary artery disease   . Obesity, Class III, BMI 40-49.9 (morbid obesity) ([place])   . Stress at work   . Chest pain 08/06/2015   PCP:  Albina Billet, MD Pharmacy:   [place], [place] - [address] [place] [address] Phone: [phone] Fax: [phone]     Social Determinants of Health (SDOH) Interventions    Readmission Risk Interventions No flowsheet data found.

## 2019-04-17 NOTE — Progress Notes (Signed)
SATURATION QUALIFICATIONS: (This note is used to comply with regulatory documentation for home oxygen)  Patient Saturations on Room Air at Rest = 95%  Patient Saturations on Room Air while Ambulating = 84%  Patient Saturations on 4 Liters of oxygen while Ambulating = 91%  Please briefly explain why patient needs home oxygen:.To maintain saturation > 88% with activity. [place]  Office [phone]

## 2019-04-17 NOTE — Discharge Instructions (Signed)
COVID-19 COVID-19 is a respiratory infection that is caused by a virus called severe acute respiratory syndrome coronavirus 2 (SARS-CoV-2). The disease is also known as coronavirus disease or novel coronavirus. In some people, the virus may not cause any symptoms. In others, it may cause a serious infection. The infection can get worse quickly and can lead to complications, such as:  Pneumonia, or infection of the lungs.  Acute respiratory distress syndrome or ARDS. This is a condition in which fluid build-up in the lungs prevents the lungs from filling with air and passing oxygen into the blood.  Acute respiratory failure. This is a condition in which there is not enough oxygen passing from the lungs to the body or when carbon dioxide is not passing from the lungs out of the body.  Sepsis or septic shock. This is a serious bodily reaction to an infection.  Blood clotting problems.  Secondary infections due to bacteria or fungus.  Organ failure. This is when your body's organs stop working. The virus that causes COVID-19 is contagious. This means that it can spread from person to person through droplets from coughs and sneezes (respiratory secretions). What are the causes? This illness is caused by a virus. You may catch the virus by:  Breathing in droplets from an infected person. Droplets can be spread by a person breathing, speaking, singing, coughing, or sneezing.  Touching something, like a table or a doorknob, that was exposed to the virus (contaminated) and then touching your mouth, nose, or eyes. What increases the risk? Risk for infection You are more likely to be infected with this virus if you:  Are within 6 feet (2 meters) of a person with COVID-19.  Provide care for or live with a person who is infected with COVID-19.  Spend time in crowded indoor spaces or live in shared housing. Risk for serious illness You are more likely to become seriously ill from the virus if you:   Are 50 years of age or older. The higher your age, the more you are at risk for serious illness.  Live in a nursing home or long-term care facility.  Have cancer.  Have a long-term (chronic) disease such as: ? Chronic lung disease, including chronic obstructive pulmonary disease or asthma. ? A long-term disease that lowers your body's ability to fight infection (immunocompromised). ? Heart disease, including heart failure, a condition in which the arteries that lead to the heart become narrow or blocked (coronary artery disease), a disease which makes the heart muscle thick, weak, or stiff (cardiomyopathy). ? Diabetes. ? Chronic kidney disease. ? Sickle cell disease, a condition in which red blood cells have an abnormal "sickle" shape. ? Liver disease.  Are obese. What are the signs or symptoms? Symptoms of this condition can range from mild to severe. Symptoms may appear any time from 2 to 14 days after being exposed to the virus. They include:  A fever or chills.  A cough.  Difficulty breathing.  Headaches, body aches, or muscle aches.  Runny or stuffy (congested) nose.  A sore throat.  New loss of taste or smell. Some people may also have stomach problems, such as nausea, vomiting, or diarrhea. Other people may not have any symptoms of COVID-19. How is this diagnosed? This condition may be diagnosed based on:  Your signs and symptoms, especially if: ? You live in an area with a COVID-19 outbreak. ? You recently traveled to or from an area where the virus is common. ? You   provide care for or live with a person who was diagnosed with COVID-19. ? You were exposed to a person who was diagnosed with COVID-19.  A physical exam.  Lab tests, which may include: ? Taking a sample of fluid from the back of your nose and throat (nasopharyngeal fluid), your nose, or your throat using a swab. ? A sample of mucus from your lungs (sputum). ? Blood tests.  Imaging tests, which  may include, X-rays, CT scan, or ultrasound. How is this treated? At present, there is no medicine to treat COVID-19. Medicines that treat other diseases are being used on a trial basis to see if they are effective against COVID-19. Your health care provider will talk with you about ways to treat your symptoms. For most people, the infection is mild and can be managed at home with rest, fluids, and over-the-counter medicines. Treatment for a serious infection usually takes places in a hospital intensive care unit (ICU). It may include one or more of the following treatments. These treatments are given until your symptoms improve.  Receiving fluids and medicines through an IV.  Supplemental oxygen. Extra oxygen is given through a tube in the nose, a face mask, or a hood.  Positioning you to lie on your stomach (prone position). This makes it easier for oxygen to get into the lungs.  Continuous positive airway pressure (CPAP) or bi-level positive airway pressure (BPAP) machine. This treatment uses mild air pressure to keep the airways open. A tube that is connected to a motor delivers oxygen to the body.  Ventilator. This treatment moves air into and out of the lungs by using a tube that is placed in your windpipe.  Tracheostomy. This is a procedure to create a hole in the neck so that a breathing tube can be inserted.  Extracorporeal membrane oxygenation (ECMO). This procedure gives the lungs a chance to recover by taking over the functions of the heart and lungs. It supplies oxygen to the body and removes carbon dioxide. Follow these instructions at home: Lifestyle  If you are sick, stay home except to get medical care. Your health care provider will tell you how long to stay home. Call your health care provider before you go for medical care.  Rest at home as told by your health care provider.  Do not use any products that contain nicotine or tobacco, such as cigarettes, e-cigarettes, and  chewing tobacco. If you need help quitting, ask your health care provider.  Return to your normal activities as told by your health care provider. Ask your health care provider what activities are safe for you. General instructions  Take over-the-counter and prescription medicines only as told by your health care provider.  Drink enough fluid to keep your urine pale yellow.  Keep all follow-up visits as told by your health care provider. This is important. How is this prevented?  There is no vaccine to help prevent COVID-19 infection. However, there are steps you can take to protect yourself and others from this virus. To protect yourself:   Do not travel to areas where COVID-19 is a risk. The areas where COVID-19 is reported change often. To identify high-risk areas and travel restrictions, check the CDC travel website: wwwnc.cdc.gov/travel/notices  If you live in, or must travel to, an area where COVID-19 is a risk, take precautions to avoid infection. ? Stay away from people who are sick. ? Wash your hands often with soap and water for 20 seconds. If soap and water   are not available, use an alcohol-based hand sanitizer. ? Avoid touching your mouth, face, eyes, or nose. ? Avoid going out in public, follow guidance from your state and local health authorities. ? If you must go out in public, wear a cloth face covering or face mask. Make sure your mask covers your nose and mouth. ? Avoid crowded indoor spaces. Stay at least 6 feet (2 meters) away from others. ? Disinfect objects and surfaces that are frequently touched every day. This may include:  Counters and tables.  Doorknobs and light switches.  Sinks and faucets.  Electronics, such as phones, remote controls, keyboards, computers, and tablets. To protect others: If you have symptoms of COVID-19, take steps to prevent the virus from spreading to others.  If you think you have a COVID-19 infection, contact your health care  provider right away. Tell your health care team that you think you may have a COVID-19 infection.  Stay home. Leave your house only to seek medical care. Do not use public transport.  Do not travel while you are sick.  Wash your hands often with soap and water for 20 seconds. If soap and water are not available, use alcohol-based hand sanitizer.  Stay away from other members of your household. Let healthy household members care for children and pets, if possible. If you have to care for children or pets, wash your hands often and wear a mask. If possible, stay in your own room, separate from others. Use a different bathroom.  Make sure that all people in your household wash their hands well and often.  Cough or sneeze into a tissue or your sleeve or elbow. Do not cough or sneeze into your hand or into the air.  Wear a cloth face covering or face mask. Make sure your mask covers your nose and mouth. Where to find more information  Centers for Disease Control and Prevention: www.cdc.gov/coronavirus/2019-ncov/index.html  World Health Organization: www.who.int/health-topics/coronavirus Contact a health care provider if:  You live in or have traveled to an area where COVID-19 is a risk and you have symptoms of the infection.  You have had contact with someone who has COVID-19 and you have symptoms of the infection. Get help right away if:  You have trouble breathing.  You have pain or pressure in your chest.  You have confusion.  You have bluish lips and fingernails.  You have difficulty waking from sleep.  You have symptoms that get worse. These symptoms may represent a serious problem that is an emergency. Do not wait to see if the symptoms will go away. Get medical help right away. Call your local emergency services (911 in the [place] Do not drive yourself to the hospital. Let the emergency medical personnel know if you think you have COVID-19. Summary  COVID-19 is a  respiratory infection that is caused by a virus. It is also known as coronavirus disease or novel coronavirus. It can cause serious infections, such as pneumonia, acute respiratory distress syndrome, acute respiratory failure, or sepsis.  The virus that causes COVID-19 is contagious. This means that it can spread from person to person through droplets from breathing, speaking, singing, coughing, or sneezing.  You are more likely to develop a serious illness if you are 50 years of age or older, have a weak immune system, live in a nursing home, or have chronic disease.  There is no medicine to treat COVID-19. Your health care provider will talk with you about ways to treat your symptoms.    Take steps to protect yourself and others from infection. Wash your hands often and disinfect objects and surfaces that are frequently touched every day. Stay away from people who are sick and wear a mask if you are sick. This information is not intended to replace advice given to you by your health care provider. Make sure you discuss any questions you have with your health care provider. Document Revised: 12/29/2018 Document Reviewed: 04/06/2018 Elsevier Patient Education  2020 [employer]  

## 2019-04-17 NOTE — Discharge Summary (Signed)
Physician Discharge Summary  Sean SEMAN [id number] DOB: Jul 22, 1960 DOA: 04/05/2019  PCP: Albina Billet, MD  Admit date: 04/05/2019 Discharge date: 04/17/2019  Admitted From: Home Disposition:  Home  Recommendations for Outpatient Follow-up:  1. Follow up with PCP in 1-2 weeks, see how he is doing from his respiratory standpoint and wean off oxygen as tolerated. 2. Please obtain BMP/CBC in one week   Home Health: No Equipment/Devices: Home on 2 L of oxygen for 2 weeks  Discharge Condition:Stable CODE STATUS:Full Diet recommendation: Heart Healthy   Brief/Interim Summary: 59 y.o. male past medical history of rheumatoid arthritis, DVT, not on anticoagulation obstructive sleep apnea obesity presents to [facility] ED on 04/05/2019 with worsening shortness of breath, patient relates she started having symptoms about 6 days prior to admission in the ED was found to be hypoxic SARS-CoV-2 PCR was positive chest x-ray revealed bilateral infiltrates, she was started on IV remdesivir and steroids to eculizumab was administered on 04/05/2020 worsening inflammatory markers and hypoxia.  Discharge Diagnoses:  Principal Problem:   Acute hypoxemic respiratory failure due to severe acute respiratory syndrome coronavirus 2 (SARS-CoV-2) disease (HCC) Active Problems:   Obesity, Class III, BMI 40-49.9 (morbid obesity) ([place])   Sleep apnea   Asthma Acute respiratory failure with hypoxia due to acute hypoxemic respiratory failure due to severe acute respiratory syndrome due to SARS-CoV-2 and possibly superimposed bacterial infection: On admission he was started on IV steroids IV remdesivir and high amounts of supplemental oxygen to try to keep saturations greater than 90%, he completed his course of IV remdesivir and steroids, he did receive 1 dose of IV Actemra. As his saturations and leukocytosis were worsening, he was started empirically on IV antibiotics after several days his oxygen requirements improved  but he was still requiring oxygen to keep saturations greater than 90% on ambulation as he desats. He will be sent home on oxygen temporarily for 2 weeks.  Physical therapy evaluated the patient and recommended no home health PT.  Right pleural effusion: Resolved with diuresis.  Obesity class III: Counseling noted.  Obstructive sleep apnea: Continue CPAP at night.  Asthma continue inhalers.   Discharge Instructions  Discharge Instructions    Diet - low sodium heart healthy   Complete by: As directed    Increase activity slowly   Complete by: As directed      Allergies as of 04/17/2019      Reactions   Penicillins Other (See Comments)   Childhood reaction. Has patient had a PCN reaction causing immediate rash, facial/tongue/throat swelling, SOB or lightheadedness with hypotension: Unknown Has patient had a PCN reaction causing severe rash involving mucus membranes or skin necrosis: Unknown Has patient had a PCN reaction that required hospitalization: No Has patient had a PCN reaction occurring within the last 10 years: No If all of the above answers are "NO", then may proceed with Cephalosporin use.      Medication List    TAKE these medications   albuterol 108 (90 Base) MCG/ACT inhaler Commonly known as: VENTOLIN HFA Inhale 2 puffs into the lungs every 6 (six) hours as needed for wheezing or shortness of breath.   budesonide-formoterol 160-4.5 MCG/ACT inhaler Commonly known as: SYMBICORT Inhale 2 puffs into the lungs 2 (two) times daily as needed (for wheezing or shortness of breath.).   sildenafil 20 MG tablet Commonly known as: REVATIO Take 20 mg by mouth daily. As needed       Allergies  Allergen Reactions  . Penicillins Other (  See Comments)    Childhood reaction. Has patient had a PCN reaction causing immediate rash, facial/tongue/throat swelling, SOB or lightheadedness with hypotension: Unknown Has patient had a PCN reaction causing severe rash involving  mucus membranes or skin necrosis: Unknown Has patient had a PCN reaction that required hospitalization: No Has patient had a PCN reaction occurring within the last 10 years: No If all of the above answers are "NO", then may proceed with Cephalosporin use.     Consultations:  None   Procedures/Studies: DG Chest 2 View  Result Date: 04/05/2019 CLINICAL DATA:  Shortness of breath EXAM: CHEST - 2 VIEW COMPARISON:  Aug 06, 2015 FINDINGS: There is apparent loculated pleural effusion on the right. There is patchy airspace opacity in each mid and lower lung zone. Heart is upper normal in size with pulmonary vascularity normal. No adenopathy. There is mild degenerative change in the thoracic spine. IMPRESSION: Patchy airspace opacity in both mid and lower lung zones, felt to represent multifocal pneumonia. Atypical organism pneumonia may well be present given this appearance. Apparent loculated pleural effusion on the right laterally. Cardiac silhouette within normal limits.  No evident adenopathy. Electronically Signed   By: Lowella Grip III M.D.   On: 04/05/2019 12:06   CT CHEST WO CONTRAST  Result Date: 04/11/2019 CLINICAL DATA:  Dyspnea on exertion. Pneumonia due to COVID-19 infection. EXAM: CT CHEST WITHOUT CONTRAST TECHNIQUE: Multidetector CT imaging of the chest was performed following the standard protocol without IV contrast. COMPARISON:  Chest radiographs, 04/08/2019.  Chest CTA, 08/06/2015. FINDINGS: Cardiovascular: Heart normal in size and configuration. No pericardial effusion or coronary artery calcifications. Normal great vessels. Mediastinum/Nodes: No enlarged mediastinal or axillary lymph nodes. Thyroid gland, trachea, and esophagus demonstrate no significant findings. Lungs/Pleura: There is hazy ground-glass opacity that extends throughout most of the lungs. There is no lung mass or nodule. No pleural effusion or pneumothorax. Airways are unremarkable. Upper Abdomen: Small hiatal  hernia. No acute findings in the visualized upper abdomen. Musculoskeletal: No fracture or acute finding. No bone lesion. No chest wall mass. IMPRESSION: 1. Bilateral, extensive ground-glass lung opacities consistent with multifocal pneumonia due to COVID-19 infection. No other acute abnormality. 2. No evidence of pulmonary edema. 3. Small hiatal hernia. Electronically Signed   By: Lajean Manes M.D.   On: 04/11/2019 10:49   DG CHEST PORT 1 VIEW  Result Date: 04/08/2019 CLINICAL DATA:  Acute respiratory failure. COVID-19 positive. Pleural effusion. EXAM: PORTABLE CHEST 1 VIEW COMPARISON:  04/05/2019 FINDINGS: Improved lung volumes. Diffuse bilateral airspace disease with interval improvement. No pleural effusion. No pneumothorax IMPRESSION: Improved lung volumes.  Improvement in bilateral airspace disease. Electronically Signed   By: Franchot Gallo M.D.   On: 04/08/2019 08:44     Subjective: No complaints feels great.  Discharge Exam: Vitals:   04/16/19 1940 04/17/19 0416  BP: 117/70 91/61  Pulse: 77 72  Resp: 20 20  Temp: 97.9 F (36.6 C) 98 F (36.7 C)  SpO2: 90% 90%   Vitals:   04/16/19 0944 04/16/19 1543 04/16/19 1940 04/17/19 0416  BP:  (!) 126/54 117/70 91/61  Pulse:  65 77 72  Resp:  18 20 20   Temp:  98.2 F (36.8 C) 97.9 F (36.6 C) 98 F (36.7 C)  TempSrc:  Oral Oral Oral  SpO2: 91% 90% 90% 90%  Weight:      Height:        General: Pt is alert, awake, not in acute distress Cardiovascular: RRR, S1/S2 +, no rubs,  no gallops Respiratory: CTA bilaterally, no wheezing, no rhonchi Abdominal: Soft, NT, ND, bowel sounds + Extremities: no edema, no cyanosis    The results of significant diagnostics from this hospitalization (including imaging, microbiology, ancillary and laboratory) are listed below for reference.     Microbiology: No results found for this or any previous visit (from the past 240 hour(s)).   Labs: BNP (last 3 results) Recent Labs     04/05/19 1515  BNP [id number]   Basic Metabolic Panel: Recent Labs  Lab 04/11/19 0353 04/13/19 0127 04/14/19 0025 04/15/19 0030  NA 137 135 135 135  K 4.4 4.7 5.0 4.8  CL 102 100 99 99  CO2 22 24 26 25   GLUCOSE 128* 115* 104* 108*  BUN 33* 41* 46* 52*  CREATININE 1.11 1.23 1.04 1.11  CALCIUM 8.6* 8.0* 8.4* 8.5*   Liver Function Tests: Recent Labs  Lab 04/11/19 0353  AST 23  ALT 30  ALKPHOS 62  BILITOT 0.8  PROT 7.6  ALBUMIN 3.1*   No results for input(s): LIPASE, AMYLASE in the last 168 hours. No results for input(s): AMMONIA in the last 168 hours. CBC: Recent Labs  Lab 04/13/19 0127 04/14/19 0025 04/15/19 0030 04/16/19 0558 04/17/19 0536  WBC 20.9* 19.4* 17.5* 13.9* 11.1*  NEUTROABS 17.6* 17.0* 15.4* 10.9* 8.2*  HGB 14.3 15.7 15.5 15.2 15.4  HCT 44.6 47.8 47.5 46.2 46.0  MCV 89.2 88.4 87.3 88.5 87.0  PLT 361 327 331 271 271   Cardiac Enzymes: No results for input(s): CKTOTAL, CKMB, CKMBINDEX, TROPONINI in the last 168 hours. BNP: Invalid input(s): POCBNP CBG: No results for input(s): GLUCAP in the last 168 hours. D-Dimer No results for input(s): DDIMER in the last 72 hours. Hgb A1c No results for input(s): HGBA1C in the last 72 hours. Lipid Profile No results for input(s): CHOL, HDL, LDLCALC, TRIG, CHOLHDL, LDLDIRECT in the last 72 hours. Thyroid function studies No results for input(s): TSH, T4TOTAL, T3FREE, THYROIDAB in the last 72 hours.  Invalid input(s): FREET3 Anemia work up No results for input(s): VITAMINB12, FOLATE, FERRITIN, TIBC, IRON, RETICCTPCT in the last 72 hours. Urinalysis    Component Value Date/Time   COLORURINE YELLOW 04/08/2019 1905   APPEARANCEUR CLEAR 04/08/2019 1905   LABSPEC 1.021 04/08/2019 1905   PHURINE 6.0 04/08/2019 1905   GLUCOSEU NEGATIVE 04/08/2019 [place] NEGATIVE 04/08/2019 [place] NEGATIVE 04/08/2019 1905   KETONESUR NEGATIVE 04/08/2019 1905   PROTEINUR NEGATIVE 04/08/2019 1905   NITRITE  NEGATIVE 04/08/2019 1905   LEUKOCYTESUR NEGATIVE 04/08/2019 1905   Sepsis Labs Invalid input(s): PROCALCITONIN,  WBC,  LACTICIDVEN Microbiology No results found for this or any previous visit (from the past 240 hour(s)).   Time coordinating discharge: Over 40 minutes  SIGNED:   Charlynne Cousins, MD  [facility] 04/17/2019, 7:33 AM Pager   If 7PM-7AM, please contact night-coverage [email or web address] Password [email or web address]

## 2019-05-14 ENCOUNTER — Other Ambulatory Visit: Payer: Self-pay

## 2019-10-13 ENCOUNTER — Emergency Department
Admission: EM | Admit: 2019-10-13 | Discharge: 2019-10-13 | Disposition: A | Discharge status: Home / Self Care | Payer: BC Managed Care – PPO | Attending: Emergency Medicine | Admitting: Emergency Medicine

## 2019-10-13 ENCOUNTER — Other Ambulatory Visit: Payer: Self-pay

## 2019-10-13 ENCOUNTER — Emergency Department: Payer: BC Managed Care – PPO

## 2019-10-13 DIAGNOSIS — I10 Essential (primary) hypertension: Secondary | ICD-10-CM | POA: Diagnosis not present

## 2019-10-13 DIAGNOSIS — R310 Gross hematuria: Secondary | ICD-10-CM | POA: Diagnosis not present

## 2019-10-13 DIAGNOSIS — Z79899 Other long term (current) drug therapy: Secondary | ICD-10-CM | POA: Insufficient documentation

## 2019-10-13 DIAGNOSIS — J45909 Unspecified asthma, uncomplicated: Secondary | ICD-10-CM | POA: Insufficient documentation

## 2019-10-13 LAB — CBC
HCT: 41.2 % (ref 39.0–52.0)
Hemoglobin: 13.5 g/dL (ref 13.0–17.0)
MCH: 28.5 pg (ref 26.0–34.0)
MCHC: 32.8 g/dL (ref 30.0–36.0)
MCV: 87.1 fL (ref 80.0–100.0)
Platelets: 262 10*3/uL (ref 150–400)
RBC: 4.73 MIL/uL (ref 4.22–5.81)
RDW: 14.6 % (ref 11.5–15.5)
WBC: 7.4 10*3/uL (ref 4.0–10.5)
nRBC: 0 % (ref 0.0–0.2)

## 2019-10-13 LAB — URINALYSIS, COMPLETE (UACMP) WITH MICROSCOPIC
Bacteria, UA: NONE SEEN
RBC / HPF: >50 RBC/hpf — ABNORMAL HIGH (ref 0–5)
Specific Gravity, Urine: 1.025 (ref 1.005–1.030)
Squamous Epithelial / HPF: NONE SEEN (ref 0–5)
WBC, UA: >50 WBC/hpf — ABNORMAL HIGH (ref 0–5)

## 2019-10-13 LAB — BASIC METABOLIC PANEL
Anion gap: 7 (ref 5–15)
BUN: 15 mg/dL (ref 6–20)
CO2: 25 mmol/L (ref 22–32)
Calcium: 8.6 mg/dL — ABNORMAL LOW (ref 8.9–10.3)
Chloride: 108 mmol/L (ref 98–111)
Creatinine, Ser: 0.85 mg/dL (ref 0.61–1.24)
GFR calc Af Amer: >60 mL/min (ref >60)
GFR calc non Af Amer: >60 mL/min (ref >60)
Glucose, Bld: 98 mg/dL (ref 70–99)
Potassium: 4 mmol/L (ref 3.5–5.1)
Sodium: 140 mmol/L (ref 135–145)

## 2019-10-13 MED ORDER — IOHEXOL 300 MG/ML  SOLN
100.0000 mL | Freq: Once | INTRAMUSCULAR | Status: AC | PRN
  Administered 2019-10-13: 100 mL via INTRAVENOUS
  Filled 2019-10-13: qty 100

## 2019-10-13 MED ORDER — SULFAMETHOXAZOLE-TRIMETHOPRIM 800-160 MG PO TABS: 1.0000 | ORAL_TABLET | Freq: Two times a day (BID) | ORAL | 0 refills | Status: AC

## 2019-10-13 NOTE — ED Provider Notes (Signed)
Emergency Department Provider Note  ____________________________________________  Time seen: Approximately 7:13 PM  I have reviewed the triage vital signs and the nursing notes.   HISTORY  Chief Complaint Hematuria   Historian Patient    HPI Sean Garza is a 59 y.o. male presents to the emergency department with concern for hematuria.  Patient states that when he used the restroom, he had a sensation of something moving through his penis and experienced a change in his stream.  Patient states that he has experienced a similar sensation when he is passed a kidney stone in the past.  He states that he did experience gross hematuria which presented as a copious amount of blood and clots from the penis.   Past Medical History:  Diagnosis Date  . Asthma   . Collagen vascular disease (HCC)    Rhematoid Arthritis  . GERD (gastroesophageal reflux disease)   . Gout   . Hx of blood clots   . Hypertension   . Sleep apnea      Immunizations up to date:  Yes.     Past Medical History:  Diagnosis Date  . Asthma   . Collagen vascular disease (HCC)    Rhematoid Arthritis  . GERD (gastroesophageal reflux disease)   . Gout   . Hx of blood clots   . Hypertension   . Sleep apnea     Patient Active Problem List   Diagnosis Date Noted  . Pneumonia due to COVID-19 virus 04/05/2019  . Acute hypoxemic respiratory failure ([place]) 04/05/2019  . Acute hypoxemic respiratory failure due to severe acute respiratory syndrome coronavirus 2 (SARS-CoV-2) disease ([place]) 04/05/2019  . Sleep apnea   . Asthma   . Swelling   . History of DVT (deep vein thrombosis)   . Family history of coronary artery disease   . Obesity, Class III, BMI 40-49.9 (morbid obesity) ([place])   . Stress at work   . Chest pain 08/06/2015    Past Surgical History:  Procedure Laterality Date  . ACHILLES TENDON REPAIR    . COLONOSCOPY WITH PROPOFOL N/A 12/13/2016   Procedure: COLONOSCOPY WITH PROPOFOL;  Surgeon:  Manya Silvas, MD;  Location: [facility] ENDOSCOPY;  Service: Endoscopy;  Laterality: N/A;  . EYE SURGERY     floppy eye syndrome surgery  . SHOULDER ARTHROSCOPY WITH OPEN ROTATOR CUFF REPAIR Left 03/24/2017   Procedure: SHOULDER ARTHROSCOPY WITH OPEN ROTATOR CUFF REPAIR;  Surgeon: Corky Mull, MD;  Location: [facility] ORS;  Service: Orthopedics;  Laterality: Left;  . UVULOPALATOPHARYNGOPLASTY      Prior to Admission medications   Medication Sig Start Date End Date Taking? Authorizing Provider  albuterol (PROVENTIL HFA;VENTOLIN HFA) 108 (90 Base) MCG/ACT inhaler Inhale 2 puffs into the lungs every 6 (six) hours as needed for wheezing or shortness of breath.     [provider]  budesonide-formoterol (SYMBICORT) 160-4.5 MCG/ACT inhaler Inhale 2 puffs into the lungs 2 (two) times daily as needed (for wheezing or shortness of breath.).     [provider]  sildenafil (REVATIO) 20 MG tablet Take 20 mg by mouth daily. As needed    [provider]  sulfamethoxazole-trimethoprim (BACTRIM DS) 800-160 MG tablet Take 1 tablet by mouth 2 (two) times daily for 7 days. 10/13/19 10/20/19  Lannie Fields, PA-C    Allergies Penicillins  Family History  Problem Relation Age of Onset  . CAD Father   . CAD Brother     Social History Social History   Tobacco  Use  . Smoking status: Never Smoker  . Smokeless tobacco: Never Used  Vaping Use  . Vaping Use: Never used  Substance Use Topics  . Alcohol use: No  . Drug use: No     Review of Systems  Constitutional: No fever/chills Eyes:  No discharge ENT: No upper respiratory complaints. Respiratory: no cough. No SOB/ use of accessory muscles to breath Gastrointestinal:   No nausea, no vomiting.  No diarrhea.  No constipation.  Genitourinary: Patient has hematuria.  Musculoskeletal: Negative for musculoskeletal pain. Skin: Negative for rash, abrasions, lacerations,  ecchymosis.    ____________________________________________   PHYSICAL EXAM:  VITAL SIGNS: ED Triage Vitals  Enc Vitals Group     BP 10/13/19 1244 (!) 148/91     Pulse Rate 10/13/19 1244 74     Resp 10/13/19 1244 18     Temp 10/13/19 1244 98.2 F (36.8 C)     Temp Source 10/13/19 1244 Oral     SpO2 10/13/19 1244 97 %     Weight 10/13/19 1246 (!) 295 lb (133.8 kg)     Height 10/13/19 1246 5\' 8"  (1.727 m)     Head Circumference --      Peak Flow --      Pain Score 10/13/19 1245 0     Pain Loc --      Pain Edu? --      Excl. in [place]? --      Constitutional: Alert and oriented. Well appearing and in no acute distress. Eyes: Conjunctivae are normal. PERRL. EOMI. Head: Atraumatic. Cardiovascular: Normal rate, regular rhythm. Normal S1 and S2.  Good peripheral circulation. Respiratory: Normal respiratory effort without tachypnea or retractions. Lungs CTAB. Good air entry to the bases with no decreased or absent breath sounds Gastrointestinal: Bowel sounds x 4 quadrants. Soft and nontender to palpation. No guarding or rigidity. No distention. Genitourinary: No CVA tenderness.  No active bleeding from urethral meatus. Musculoskeletal: Full range of motion to all extremities. No obvious deformities noted Neurologic:  Normal for age. No gross focal neurologic deficits are appreciated.  Skin:  Skin is warm, dry and intact. No rash noted. Psychiatric: Mood and affect are normal for age. Speech and behavior are normal.   ____________________________________________   LABS (all labs ordered are listed, but only abnormal results are displayed)  Labs Reviewed  URINALYSIS, COMPLETE (UACMP) WITH MICROSCOPIC - Abnormal; Notable for the following components:      Result Value   Color, Urine RED (*)    APPearance TURBID (*)    Glucose, UA   (*)    Value: TEST NOT REPORTED DUE TO COLOR INTERFERENCE OF URINE PIGMENT   Hgb urine dipstick   (*)    Value: TEST NOT REPORTED DUE TO COLOR  INTERFERENCE OF URINE PIGMENT   Bilirubin Urine   (*)    Value: TEST NOT REPORTED DUE TO COLOR INTERFERENCE OF URINE PIGMENT   Ketones, ur   (*)    Value: TEST NOT REPORTED DUE TO COLOR INTERFERENCE OF URINE PIGMENT   Protein, ur   (*)    Value: TEST NOT REPORTED DUE TO COLOR INTERFERENCE OF URINE PIGMENT   Nitrite   (*)    Value: TEST NOT REPORTED DUE TO COLOR INTERFERENCE OF URINE PIGMENT   Leukocytes,Ua   (*)    Value: TEST NOT REPORTED DUE TO COLOR INTERFERENCE OF URINE PIGMENT   RBC / HPF >50 (*)    WBC, UA >50 (*)    All other  components within normal limits  BASIC METABOLIC PANEL - Abnormal; Notable for the following components:   Calcium 8.6 (*)    All other components within normal limits  CBC   ____________________________________________  EKG   ____________________________________________  RADIOLOGY Unk Pinto, personally viewed and evaluated these images (plain radiographs) as part of my medical decision making, as well as reviewing the written report by the radiologist.  CT ABDOMEN PELVIS W CONTRAST  Result Date: 10/13/2019 CLINICAL DATA:  59 year old presenting with gross hematuria. EXAM: CT ABDOMEN AND PELVIS WITH CONTRAST TECHNIQUE: Multidetector CT imaging of the abdomen and pelvis was performed using the standard protocol following bolus administration of intravenous contrast. CONTRAST:  125mL OMNIPAQUE IOHEXOL 300 MG/ML IV. COMPARISON:  07/03/2018, 07/06/2006. FINDINGS: Lower chest: Respiratory motion blurred images of the lung bases. Scarring in the lingula and RIGHT MIDDLE LOBE. Heart size upper normal. Hepatobiliary: Mild diffuse hepatic steatosis without focal hepatic parenchymal abnormality. Gallbladder normal in appearance without calcified gallstones. No biliary ductal dilation. Pancreas: Normal in appearance without evidence of mass, ductal dilation, or inflammation. Spleen: Normal in size and appearance. Adrenals/Urinary Tract: Normal appearing  adrenal glands. Benign cortical cysts involving both kidneys, the largest in the mid LEFT kidney measuring approximately 3.8 cm, unchanged since the April, 2020 examination. No significant focal parenchymal abnormality involving either kidney. Adjacent very small (1-2 mm) calculi in a lower pole calyx of the RIGHT kidney, unchanged. No LEFT renal calculi. No obstructing ureteral calculi on either side. Normal unenhanced appearance of the urinary bladder; no echogenic thrombus or layering hemorrhage is visible. Stomach/Bowel: Very small hiatal hernia, unchanged. Stomach otherwise normal in appearance. Normal-appearing small bowel. Scattered diverticula involving the sigmoid colon without evidence of acute diverticulitis. Remainder of the colon unremarkable with an expected stool burden. Normal appearing decompressed appendix in the RIGHT upper pelvis. Vascular/Lymphatic: No visible aortoiliofemoral atherosclerosis. Widely patent visceral arteries. Normal-appearing portal venous and systemic venous systems. No pathologic lymphadenopathy. Reproductive: Borderline to mild median lobe prostate gland enlargement. Prosthetic calcifications. Normal seminal vesicles. Other: None. Musculoskeletal: Osseous demineralization. DISH involving the thoracic spine. Facet degenerative changes involving the lower lumbar spine. No acute findings. IMPRESSION: 1. No acute abnormalities involving the abdomen or pelvis. 2. Adjacent very small (1-2 mm) calculi in a lower pole calyx of the RIGHT kidney, unchanged since a prior CT in April, 2020. No obstructing ureteral calculi on either side. 3. Mild diffuse hepatic steatosis without focal hepatic parenchymal abnormality. 4. Very small hiatal hernia, unchanged. 5. Scattered sigmoid colon diverticula without evidence of acute diverticulitis. 6. Borderline to mild median lobe prostate gland enlargement. Electronically Signed   By: Evangeline Dakin M.D.   On: 10/13/2019 18:02     ____________________________________________    PROCEDURES  Procedure(s) performed:     Procedures     Medications  iohexol (OMNIPAQUE) 300 MG/ML solution 100 mL (100 mLs Intravenous Contrast Given 10/13/19 1724)     ____________________________________________   INITIAL IMPRESSION / ASSESSMENT AND PLAN / ED COURSE  Pertinent labs & imaging results that were available during my care of the patient were reviewed by me and considered in my medical decision making (see chart for details).  Assessment and plan Gross hematuria 59 year old male presents to the emergency department after an episode of gross hematuria with clots after patient coughed while urinating.  Vital signs are reassuring at triage.  On physical exam, abdomen was soft and nontender without guarding.  Patient had no CVA tenderness on exam.  CT abdomen and pelvis revealed no  signs of obstruction.  Urinalysis revealed hematuria and pyuria.  I consulted urologist on-call, Dr. Jeffie Pollock.  Very much appreciate consult.  Specifically, I reviewed patient's history and presenting symptoms.  Dr. Lucile Shutters reviewed patient's CT scan and recommended follow-up with him in the office as well as outpatient antibiotics.  He suspected that patient might have a urethral laceration from a passing renal stone.  Information from specialist was conveyed to patient and he was reassured.  He was given follow-up instructions and advised to take Bactrim twice daily for the next 7 days.  Reviewed antibiotic choice with Dr. Alfonse Spruce and he agreed.  Return precautions were given to return with new or worsening symptoms.     ____________________________________________  FINAL CLINICAL IMPRESSION(S) / ED DIAGNOSES  Final diagnoses:  Gross hematuria      NEW MEDICATIONS STARTED DURING THIS VISIT:  ED Discharge Orders         Ordered    sulfamethoxazole-trimethoprim (BACTRIM DS) 800-160 MG tablet  2 times daily     Discontinue   Reprint     10/13/19 1901              This chart was dictated using voice recognition software/Dragon. Despite best efforts to proofread, errors can occur which can change the meaning. Any change was purely unintentional.     Lannie Fields, PA-C 10/13/19 1936    Blake Divine, MD 10/15/19 [phone]

## 2019-10-13 NOTE — ED Notes (Signed)
Repeat VS obtained by this RN. Pt visualized in NAD at this time. Pt sitting on bench playing on phone. This RN explained and apologized for delay.

## 2019-10-13 NOTE — ED Triage Notes (Addendum)
Pt arrives POV for reports of urinary issues x 4 weeks. Pt reports something his urine feels like an "aerosol can coming out". Pt reports hx enlarged prostate dx last year. Pt reports he was urinating this morning when he felt the need to cough so he "stopped the flow" and once he let go to urinate more blood started coming out. Pt showed this RN a picture of the blood in toilet at home and it was bright red, no clots. Pt in NAD, skin warm and dry. Pt pants clean and dry at this time, pt feels like the bleeding has stopped.

## 2019-10-13 NOTE — ED Notes (Signed)
Pt unable to sign E-signature due to signature pad malfunction. Pt verbalized understanding of d/c instructions and had no additional questions or concerns for this RN or provider. Pt left with d/c instructions and gathered all personal belongings from room and removed them prior to ED departure.   

## 2019-10-13 NOTE — Discharge Instructions (Signed)
Take Bactrim twice daily for the next seven days.

## 2019-10-15 LAB — URINE CULTURE

## 2019-11-01 ENCOUNTER — Other Ambulatory Visit: Payer: Self-pay

## 2019-11-01 ENCOUNTER — Encounter (INDEPENDENT_AMBULATORY_CARE_PROVIDER_SITE_OTHER): Payer: Self-pay | Admitting: Vascular Surgery

## 2019-11-01 ENCOUNTER — Ambulatory Visit (INDEPENDENT_AMBULATORY_CARE_PROVIDER_SITE_OTHER): Payer: BC Managed Care – PPO | Admitting: Vascular Surgery

## 2019-11-01 VITALS — BP 124/84 | HR 78 | Ht 68.0 in | Wt 325.0 lb

## 2019-11-01 DIAGNOSIS — Z86718 Personal history of other venous thrombosis and embolism: Secondary | ICD-10-CM

## 2019-11-01 DIAGNOSIS — M79669 Pain in unspecified lower leg: Secondary | ICD-10-CM

## 2019-11-01 DIAGNOSIS — I890 Lymphedema, not elsewhere classified: Secondary | ICD-10-CM | POA: Diagnosis not present

## 2019-11-01 DIAGNOSIS — M7989 Other specified soft tissue disorders: Secondary | ICD-10-CM

## 2019-11-01 DIAGNOSIS — J4520 Mild intermittent asthma, uncomplicated: Secondary | ICD-10-CM

## 2019-11-01 NOTE — Progress Notes (Signed)
MRN : [id number]  Sean Garza is a 59 y.o. (1960/08/31) male who presents with chief complaint of  Chief Complaint  Patient presents with  . New Patient (Initial Visit)    LE Edema  .  History of Present Illness:  Patient is seen for evaluation of leg pain and leg swelling. The patient first noticed the swelling remotely. The swelling is associated with pain and discoloration. The pain and swelling worsens with prolonged dependency and improves with elevation. The pain is unrelated to activity.  The patient notes that in the morning the legs are significantly improved but they steadily worsened throughout the course of the day. The patient also notes a steady worsening of the discoloration in the ankle and shin area.   The patient denies claudication symptoms.  The patient denies symptoms consistent with rest pain.  The patient denies and extensive history of DJD and LS spine disease.  The patient has no had any past angiography, interventions or vascular surgery.  Elevation makes the leg symptoms better, dependency makes them much worse. There is no history of ulcerations. The patient denies any recent changes in medications.  The patient has not been wearing graduated compression.  The patient denies a history of DVT or PE. There is no prior history of phlebitis. There is no history of primary lymphedema.  No history of malignancies. No history of trauma or groin or pelvic surgery. There is no history of radiation treatment to the groin or pelvis  The patient denies amaurosis fugax or recent TIA symptoms. There are no recent neurological changes noted. The patient denies recent episodes of angina or shortness of breath  Echo done 08/06/2019: Study Conclusions   - Left ventricle: The cavity size was normal. Systolic function was  normal. The estimated ejection fraction was in the range of 60%  to 65%. Wall motion was normal; there were no regional wall  motion  abnormalities. Left ventricular diastolic function  parameters were normal.  - Left atrium: The atrium was mildly dilated.  - Right ventricle: Systolic function was normal.  - Pulmonary arteries: Systolic pressure was within the normal  range.   Impressions:   - Normal study.   Labs: LFT's are normal Albumen is slightly low at 3.1 Renal function is normal with GFR >60   Current Meds  Medication Sig  . albuterol (PROVENTIL HFA;VENTOLIN HFA) 108 (90 Base) MCG/ACT inhaler Inhale 2 puffs into the lungs every 6 (six) hours as needed for wheezing or shortness of breath.   . Ascorbic Acid (VITAMIN C) 1000 MG tablet Take 1,000 mg by mouth daily.  . budesonide-formoterol (SYMBICORT) 160-4.5 MCG/ACT inhaler Inhale 2 puffs into the lungs 2 (two) times daily as needed (for wheezing or shortness of breath.).   Marland Kitchen ibuprofen (ADVIL) 100 MG/5ML suspension Take 1,000 mg by mouth every 4 (four) hours as needed.  . zinc gluconate 50 MG tablet Take 50 mg by mouth daily.    Past Medical History:  Diagnosis Date  . Asthma   . Collagen vascular disease (HCC)    Rhematoid Arthritis  . GERD (gastroesophageal reflux disease)   . Gout   . Hx of blood clots   . Hypertension   . Sleep apnea     Past Surgical History:  Procedure Laterality Date  . ACHILLES TENDON REPAIR    . COLONOSCOPY WITH PROPOFOL N/A 12/13/2016   Procedure: COLONOSCOPY WITH PROPOFOL;  Surgeon: Manya Silvas, MD;  Location: [facility] ENDOSCOPY;  Service: Endoscopy;  Laterality: N/A;  . EYE SURGERY     floppy eye syndrome surgery  . SHOULDER ARTHROSCOPY WITH OPEN ROTATOR CUFF REPAIR Left 03/24/2017   Procedure: SHOULDER ARTHROSCOPY WITH OPEN ROTATOR CUFF REPAIR;  Surgeon: Corky Mull, MD;  Location: [facility] ORS;  Service: Orthopedics;  Laterality: Left;  . UVULOPALATOPHARYNGOPLASTY      Social History Social History   Tobacco Use  . Smoking status: Never Smoker  . Smokeless tobacco: Never Used  Vaping Use  . Vaping Use:  Never used  Substance Use Topics  . Alcohol use: No  . Drug use: No    Family History Family History  Problem Relation Age of Onset  . CAD Father   . CAD Brother   No family history of bleeding/clotting disorders, porphyria or autoimmune disease   Allergies  Allergen Reactions  . Penicillins Other (See Comments)    Childhood reaction. Has patient had a PCN reaction causing immediate rash, facial/tongue/throat swelling, SOB or lightheadedness with hypotension: Unknown Has patient had a PCN reaction causing severe rash involving mucus membranes or skin necrosis: Unknown Has patient had a PCN reaction that required hospitalization: No Has patient had a PCN reaction occurring within the last 10 years: No If all of the above answers are "NO", then may proceed with Cephalosporin use.      REVIEW OF SYSTEMS (Negative unless checked)  Constitutional: [] Weight loss  [] Fever  [] Chills Cardiac: [] Chest pain   [] Chest pressure   [] Palpitations   [] Shortness of breath when laying flat   [] Shortness of breath with exertion. Vascular:  [] Pain in legs with walking   [] Pain in legs at rest  [] History of DVT   [] Phlebitis   [x] Swelling in legs   [] Varicose veins   [] Non-healing ulcers Pulmonary:   [] Uses home oxygen   [] Productive cough   [] Hemoptysis   [] Wheeze  [] COPD   [x] Asthma Neurologic:  [] Dizziness   [] Seizures   [] History of stroke   [] History of TIA  [] Aphasia   [] Vissual changes   [] Weakness or numbness in arm   [] Weakness or numbness in leg Musculoskeletal:   [] Joint swelling   [] Joint pain   [] Low back pain Hematologic:  [] Easy bruising  [] Easy bleeding   [] Hypercoagulable state   [] Anemic Gastrointestinal:  [] Diarrhea   [] Vomiting  [] Gastroesophageal reflux/heartburn   [] Difficulty swallowing. Genitourinary:  [] Chronic kidney disease   [] Difficult urination  [] Frequent urination   [] Blood in urine Skin:  [] Rashes   [] Ulcers  Psychological:  [] History of anxiety   []  History of  major depression.  Physical Examination  Vitals:   11/01/19 0954  BP: 124/84  Pulse: 78  Weight: (!) 325 lb (147.4 kg)  Height: 5\' 8"  (1.727 m)   Body mass index is 49.42 kg/m. Gen: WD/WN, NAD Head: [place]/AT, No temporalis wasting.  Ear/Nose/Throat: Hearing grossly intact, nares w/o erythema or drainage, poor dentition Eyes: PER, EOMI, sclera nonicteric.  Neck: Supple, no masses.  No bruit or JVD.  Pulmonary:  Good air movement, clear to auscultation bilaterally, no use of accessory muscles.  Cardiac: RRR, normal S1, S2, no Murmurs. Vascular: scattered varicosities present bilaterally.  Mild venous stasis changes to the legs bilaterally.  4+ soft pitting edema Vessel Right Left  Radial Palpable Palpable  PT Not Palpable Not Palpable  DP Not Palpable Not Palpable  Gastrointestinal: soft, non-distended. No guarding/no peritoneal signs.  Musculoskeletal: M/S 5/5 throughout.  No deformity or atrophy.  Neurologic: CN 2-12 intact. Pain and light touch intact in extremities.  Symmetrical.  Speech is fluent. Motor exam as listed above. Psychiatric: Judgment intact, Mood & affect appropriate for pt's clinical situation. Dermatologic: No rashes or ulcers noted.  No changes consistent with cellulitis. Lymph : + lichenification with skin changes of chronic lymphedema.  CBC Lab Results  Component Value Date   WBC 7.4 10/13/2019   HGB 13.5 10/13/2019   HCT 41.2 10/13/2019   MCV 87.1 10/13/2019   PLT 262 10/13/2019    BMET    Component Value Date/Time   NA 140 10/13/2019 1308   NA 142 05/24/2011 0057   K 4.0 10/13/2019 1308   K 4.1 05/24/2011 0057   CL 108 10/13/2019 1308   CL 103 05/24/2011 0057   CO2 25 10/13/2019 1308   CO2 25 05/24/2011 0057   GLUCOSE 98 10/13/2019 1308   GLUCOSE 96 05/24/2011 0057   BUN 15 10/13/2019 1308   BUN 16 05/24/2011 0057   CREATININE 0.85 10/13/2019 1308   CREATININE 0.83 05/24/2011 0057   CALCIUM 8.6 (L) 10/13/2019 1308   CALCIUM 8.6  05/24/2011 0057   GFRNONAA >60 10/13/2019 1308   GFRNONAA >60 05/24/2011 0057   GFRAA >60 10/13/2019 1308   GFRAA >60 05/24/2011 0057   Estimated Creatinine Clearance: 134 mL/min (by C-G formula based on SCr of 0.85 mg/dL).  COAG No results found for: INR, PROTIME  Radiology CT ABDOMEN PELVIS W CONTRAST  Result Date: 10/13/2019 CLINICAL DATA:  59 year old presenting with gross hematuria. EXAM: CT ABDOMEN AND PELVIS WITH CONTRAST TECHNIQUE: Multidetector CT imaging of the abdomen and pelvis was performed using the standard protocol following bolus administration of intravenous contrast. CONTRAST:  156mL OMNIPAQUE IOHEXOL 300 MG/ML IV. COMPARISON:  07/03/2018, 07/06/2006. FINDINGS: Lower chest: Respiratory motion blurred images of the lung bases. Scarring in the lingula and RIGHT MIDDLE LOBE. Heart size upper normal. Hepatobiliary: Mild diffuse hepatic steatosis without focal hepatic parenchymal abnormality. Gallbladder normal in appearance without calcified gallstones. No biliary ductal dilation. Pancreas: Normal in appearance without evidence of mass, ductal dilation, or inflammation. Spleen: Normal in size and appearance. Adrenals/Urinary Tract: Normal appearing adrenal glands. Benign cortical cysts involving both kidneys, the largest in the mid LEFT kidney measuring approximately 3.8 cm, unchanged since the April, 2020 examination. No significant focal parenchymal abnormality involving either kidney. Adjacent very small (1-2 mm) calculi in a lower pole calyx of the RIGHT kidney, unchanged. No LEFT renal calculi. No obstructing ureteral calculi on either side. Normal unenhanced appearance of the urinary bladder; no echogenic thrombus or layering hemorrhage is visible. Stomach/Bowel: Very small hiatal hernia, unchanged. Stomach otherwise normal in appearance. Normal-appearing small bowel. Scattered diverticula involving the sigmoid colon without evidence of acute diverticulitis. Remainder of the colon  unremarkable with an expected stool burden. Normal appearing decompressed appendix in the RIGHT upper pelvis. Vascular/Lymphatic: No visible aortoiliofemoral atherosclerosis. Widely patent visceral arteries. Normal-appearing portal venous and systemic venous systems. No pathologic lymphadenopathy. Reproductive: Borderline to mild median lobe prostate gland enlargement. Prosthetic calcifications. Normal seminal vesicles. Other: None. Musculoskeletal: Osseous demineralization. DISH involving the thoracic spine. Facet degenerative changes involving the lower lumbar spine. No acute findings. IMPRESSION: 1. No acute abnormalities involving the abdomen or pelvis. 2. Adjacent very small (1-2 mm) calculi in a lower pole calyx of the RIGHT kidney, unchanged since a prior CT in April, 2020. No obstructing ureteral calculi on either side. 3. Mild diffuse hepatic steatosis without focal hepatic parenchymal abnormality. 4. Very small hiatal hernia, unchanged. 5. Scattered sigmoid colon diverticula without evidence of acute diverticulitis. 6. Borderline  to mild median lobe prostate gland enlargement. Electronically Signed   By: Evangeline Dakin M.D.   On: 10/13/2019 18:02     Assessment/Plan 1. Pain and swelling of lower leg, unspecified laterality I have had a long discussion with the patient regarding swelling and why it  causes symptoms.  Patient will begin wearing graduated compression stockings class 1 (20-30 mmHg) on a daily basis a prescription was given. The patient will  beginning wearing the stockings first thing in the morning and removing them in the evening. The patient is instructed specifically not to sleep in the stockings.   In addition, behavioral modification will be initiated.  This will include frequent elevation, use of over the counter pain medications and exercise such as walking.  I have reviewed systemic causes for chronic edema such as liver, kidney and cardiac etiologies.  The patient denies  problems with these organ systems.    Consideration for a lymph pump will also be made based upon the effectiveness of conservative therapy.  This would help to improve the edema control and prevent sequela such as ulcers and infections   Patient should undergo duplex ultrasound of the venous system to ensure that DVT or reflux is not present.  Also given the lack of palpable pulses an ABI will be obtained  The patient will follow-up with me after the ultrasound.   - VAS [place] ABI WITH/WO TBI; Future - VAS [place] LOWER EXTREMITY VENOUS (DVT); Future  2. Lymphedema I have had a long discussion with the patient regarding swelling and why it  causes symptoms.  Patient will begin wearing graduated compression stockings class 1 (20-30 mmHg) on a daily basis a prescription was given. The patient will  beginning wearing the stockings first thing in the morning and removing them in the evening. The patient is instructed specifically not to sleep in the stockings.   In addition, behavioral modification will be initiated.  This will include frequent elevation, use of over the counter pain medications and exercise such as walking.  I have reviewed systemic causes for chronic edema such as liver, kidney and cardiac etiologies.  The patient denies problems with these organ systems.    Consideration for a lymph pump will also be made based upon the effectiveness of conservative therapy.  This would help to improve the edema control and prevent sequela such as ulcers and infections   Patient should undergo duplex ultrasound of the venous system to ensure that DVT or reflux is not present.  The patient will follow-up with me after the ultrasound.    3. History of DVT (deep vein thrombosis) I have had a long discussion with the patient regarding swelling and why it  causes symptoms.  Patient will begin wearing graduated compression stockings class 1 (20-30 mmHg) on a daily basis a prescription was given. The  patient will  beginning wearing the stockings first thing in the morning and removing them in the evening. The patient is instructed specifically not to sleep in the stockings.   In addition, behavioral modification will be initiated.  This will include frequent elevation, use of over the counter pain medications and exercise such as walking.  I have reviewed systemic causes for chronic edema such as liver, kidney and cardiac etiologies.  The patient denies problems with these organ systems.    Consideration for a lymph pump will also be made based upon the effectiveness of conservative therapy.  This would help to improve the edema control and prevent sequela  such as ulcers and infections   Patient should undergo duplex ultrasound of the venous system to ensure that DVT or reflux is not present.  The patient will follow-up with me after the ultrasound.   - VAS [place] LOWER EXTREMITY VENOUS (DVT); Future  4. Mild intermittent asthma without complication Continue pulmonary medications and aerosols as already ordered, these medications have been reviewed and there are no changes at this time.     Hortencia Pilar, MD  11/01/2019 10:08 AM

## 2019-11-02 ENCOUNTER — Encounter (INDEPENDENT_AMBULATORY_CARE_PROVIDER_SITE_OTHER): Payer: Self-pay | Admitting: Vascular Surgery

## 2019-11-02 DIAGNOSIS — M79669 Pain in unspecified lower leg: Secondary | ICD-10-CM | POA: Insufficient documentation

## 2019-11-14 ENCOUNTER — Ambulatory Visit (INDEPENDENT_AMBULATORY_CARE_PROVIDER_SITE_OTHER): Payer: BC Managed Care – PPO

## 2019-11-14 ENCOUNTER — Other Ambulatory Visit: Payer: Self-pay

## 2019-11-14 ENCOUNTER — Ambulatory Visit (INDEPENDENT_AMBULATORY_CARE_PROVIDER_SITE_OTHER): Payer: BC Managed Care – PPO | Admitting: Nurse Practitioner

## 2019-11-14 VITALS — BP 130/84 | HR 76 | Ht 67.0 in | Wt 328.0 lb

## 2019-11-14 DIAGNOSIS — I83811 Varicose veins of right lower extremities with pain: Secondary | ICD-10-CM

## 2019-11-14 DIAGNOSIS — M79669 Pain in unspecified lower leg: Secondary | ICD-10-CM

## 2019-11-14 DIAGNOSIS — I890 Lymphedema, not elsewhere classified: Secondary | ICD-10-CM | POA: Diagnosis not present

## 2019-11-14 DIAGNOSIS — Z86718 Personal history of other venous thrombosis and embolism: Secondary | ICD-10-CM | POA: Diagnosis not present

## 2019-11-14 DIAGNOSIS — M7989 Other specified soft tissue disorders: Secondary | ICD-10-CM | POA: Diagnosis not present

## 2019-11-19 ENCOUNTER — Encounter (INDEPENDENT_AMBULATORY_CARE_PROVIDER_SITE_OTHER): Payer: Self-pay | Admitting: Nurse Practitioner

## 2019-11-19 NOTE — Progress Notes (Signed)
Subjective:    Patient ID: Sean Garza, male    DOB: 06-07-1960, 59 y.o.   MRN: [id number] Chief Complaint  Patient presents with  . Follow-up    U/S follow up    Patient is seen for evaluation of leg swelling. The patient first noticed the swelling remotely but is now concerned because of a significant increase in the overall edema. The swelling is associated with pain and discoloration. The patient notes that in the morning the legs are significantly improved but they steadily worsened throughout the course of the day. Elevation makes the legs better, dependency makes them much worse.   There is no history of ulcerations associated with the swelling.   The patient denies any recent changes in their medications.  The patient has not been wearing graduated compression consistently.  The patient has no had any past angiography, interventions or vascular surgery.  The patient has had a previous history of DVT after an Achilles tendon rupture. This was approximately 15 years ago in the right lower extremity.  There is no history of radiation treatment to the groin or pelvis No history of malignancies. No history of trauma or groin or pelvic surgery. No history of foreign travel or parasitic infections area   Today noninvasive studies show evidence of reflux in the right common femoral vein. There is also reflux in the great saphenous vein at the proximal thigh extending to the distal thigh. The vein diameters range from 0.44 to 0.92 cm. There is no evidence of DVT or superficial venous thrombosis seen in the right lower extremity.   Review of Systems  Cardiovascular: Positive for leg swelling.  All other systems reviewed and are negative.      Objective:   Physical Exam Vitals reviewed.  HENT:     Head: Normocephalic.  Cardiovascular:     Rate and Rhythm: Normal rate and regular rhythm.     Pulses: Normal pulses.  Pulmonary:     Effort: Pulmonary effort is normal.    Musculoskeletal:     Right lower leg: Edema present.  Neurological:     Mental Status: He is alert and oriented to person, place, and time.  Psychiatric:        Mood and Affect: Mood normal.        Behavior: Behavior normal.        Thought Content: Thought content normal.        Judgment: Judgment normal.     BP 130/84   Pulse 76   Ht 5\' 7"  (1.702 m)   Wt (!) 328 lb (148.8 kg)   BMI 51.37 kg/m   Past Medical History:  Diagnosis Date  . Asthma   . Collagen vascular disease (HCC)    Rhematoid Arthritis  . GERD (gastroesophageal reflux disease)   . Gout   . Hx of blood clots   . Hypertension   . Sleep apnea     Social History   Socioeconomic History  . Marital status: Single    Spouse name: Not on file  . Number of children: Not on file  . Years of education: Not on file  . Highest education level: Not on file  Occupational History  . Not on file  Tobacco Use  . Smoking status: Never Smoker  . Smokeless tobacco: Never Used  Vaping Use  . Vaping Use: Never used  Substance and Sexual Activity  . Alcohol use: No  . Drug use: No  . Sexual activity: Not on  file  Other Topics Concern  . Not on file  Social History Narrative  . Not on file   Social Determinants of Health   Financial Resource Strain:   . Difficulty of Paying Living Expenses: Not on file  Food Insecurity:   . Worried About Charity fundraiser in the Last Year: Not on file  . Ran Out of Food in the Last Year: Not on file  Transportation Needs:   . Lack of Transportation (Medical): Not on file  . Lack of Transportation (Non-Medical): Not on file  Physical Activity:   . Days of Exercise per Week: Not on file  . Minutes of Exercise per Session: Not on file  Stress:   . Feeling of Stress : Not on file  Social Connections:   . Frequency of Communication with Friends and Family: Not on file  . Frequency of Social Gatherings with Friends and Family: Not on file  . Attends Religious Services: Not  on file  . Active Member of Clubs or Organizations: Not on file  . Attends Archivist Meetings: Not on file  . Marital Status: Not on file  Intimate Partner Violence:   . Fear of Current or Ex-Partner: Not on file  . Emotionally Abused: Not on file  . Physically Abused: Not on file  . Sexually Abused: Not on file    Past Surgical History:  Procedure Laterality Date  . ACHILLES TENDON REPAIR    . COLONOSCOPY WITH PROPOFOL N/A 12/13/2016   Procedure: COLONOSCOPY WITH PROPOFOL;  Surgeon: Manya Silvas, MD;  Location: [facility] ENDOSCOPY;  Service: Endoscopy;  Laterality: N/A;  . EYE SURGERY     floppy eye syndrome surgery  . SHOULDER ARTHROSCOPY WITH OPEN ROTATOR CUFF REPAIR Left 03/24/2017   Procedure: SHOULDER ARTHROSCOPY WITH OPEN ROTATOR CUFF REPAIR;  Surgeon: Corky Mull, MD;  Location: [facility] ORS;  Service: Orthopedics;  Laterality: Left;  . UVULOPALATOPHARYNGOPLASTY      Family History  Problem Relation Age of Onset  . CAD Father   . CAD Brother     Allergies  Allergen Reactions  . Penicillins Other (See Comments)    Childhood reaction. Has patient had a PCN reaction causing immediate rash, facial/tongue/throat swelling, SOB or lightheadedness with hypotension: Unknown Has patient had a PCN reaction causing severe rash involving mucus membranes or skin necrosis: Unknown Has patient had a PCN reaction that required hospitalization: No Has patient had a PCN reaction occurring within the last 10 years: No If all of the above answers are "NO", then may proceed with Cephalosporin use.        Assessment & Plan:   1. Varicose veins of right lower extremity with pain  Recommend:  The patient has large symptomatic varicose veins that are painful and associated with swelling.  I have had a long discussion with the patient regarding  varicose veins and why they cause symptoms.  Patient will begin wearing graduated compression stockings class 1 on a daily basis,  beginning first thing in the morning and removing them in the evening. The patient is instructed specifically not to sleep in the stockings.    The patient  will also begin using over-the-counter analgesics such as Motrin 600 mg po TID to help control the symptoms.    In addition, behavioral modification including elevation during the day will be initiated.    Pending the results of these changes the  patient will be reevaluated in three months.     Further plans  will be based on the ultrasound results and whether conservative therapies are successful at eliminating the pain and swelling.   2. Lymphedema I have had a long discussion with the patient regarding swelling and why it  causes symptoms.  Patient will begin wearing graduated compression stockings class 1 (20-30 mmHg) on a daily basis a prescription was given. The patient will  beginning wearing the stockings first thing in the morning and removing them in the evening. The patient is instructed specifically not to sleep in the stockings.   In addition, behavioral modification will be initiated.  This will include frequent elevation, use of over the counter pain medications and exercise such as walking.  I have reviewed systemic causes for chronic edema such as liver, kidney and cardiac etiologies.  The patient denies problems with these organ systems.    Consideration for a lymph pump will also be made based upon the effectiveness of conservative therapy.  This would help to improve the edema control and prevent sequela such as ulcers and infections   The patient will follow-up with me after the ultrasound.     Current Outpatient Medications on File Prior to Visit  Medication Sig Dispense Refill  . albuterol (PROVENTIL HFA;VENTOLIN HFA) 108 (90 Base) MCG/ACT inhaler Inhale 2 puffs into the lungs every 6 (six) hours as needed for wheezing or shortness of breath.     . Ascorbic Acid (VITAMIN C) 1000 MG tablet Take 1,000 mg by mouth  daily.    . budesonide-formoterol (SYMBICORT) 160-4.5 MCG/ACT inhaler Inhale 2 puffs into the lungs 2 (two) times daily as needed (for wheezing or shortness of breath.).     Marland Kitchen ibuprofen (ADVIL) 100 MG/5ML suspension Take 1,000 mg by mouth every 4 (four) hours as needed.    . sildenafil (REVATIO) 20 MG tablet Take 20 mg by mouth daily. As needed    . zinc gluconate 50 MG tablet Take 50 mg by mouth daily.     No current facility-administered medications on file prior to visit.    There are no Patient Instructions on file for this visit. No follow-ups on file.   Kris Hartmann, NP

## 2020-03-06 ENCOUNTER — Ambulatory Visit (INDEPENDENT_AMBULATORY_CARE_PROVIDER_SITE_OTHER): Payer: BC Managed Care – PPO | Admitting: Vascular Surgery

## 2021-01-20 ENCOUNTER — Encounter: Payer: Self-pay | Admitting: Emergency Medicine

## 2021-01-20 ENCOUNTER — Other Ambulatory Visit: Payer: Self-pay

## 2021-01-20 ENCOUNTER — Emergency Department
Admission: EM | Admit: 2021-01-20 | Discharge: 2021-01-20 | Disposition: A | Discharge status: Home / Self Care | Payer: No Typology Code available for payment source | Attending: Emergency Medicine | Admitting: Emergency Medicine

## 2021-01-20 ENCOUNTER — Emergency Department: Payer: No Typology Code available for payment source

## 2021-01-20 DIAGNOSIS — M25561 Pain in right knee: Secondary | ICD-10-CM | POA: Insufficient documentation

## 2021-01-20 DIAGNOSIS — R531 Weakness: Secondary | ICD-10-CM | POA: Diagnosis not present

## 2021-01-20 DIAGNOSIS — I10 Essential (primary) hypertension: Secondary | ICD-10-CM | POA: Diagnosis not present

## 2021-01-20 DIAGNOSIS — J45909 Unspecified asthma, uncomplicated: Secondary | ICD-10-CM | POA: Diagnosis not present

## 2021-01-20 MED ORDER — OXYCODONE-ACETAMINOPHEN 5-325 MG PO TABS
1.0000 | ORAL_TABLET | Freq: Once | ORAL | Status: AC
  Administered 2021-01-20: 1 via ORAL
  Filled 2021-01-20: qty 1

## 2021-01-20 NOTE — ED Triage Notes (Signed)
Pt via POV from home. 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 Unable to bear weight. Denies numbness or tingling. Pt is A&Ox4 and NAD.  Pt is WC pt.

## 2021-01-20 NOTE — ED Provider Notes (Signed)
[facility] Emergency Department Provider Note  ____________________________________________   Event Date/Time   First MD Initiated Contact with Patient 01/20/21 1548     (approximate)  I have reviewed the triage vital signs and the nursing notes.   HISTORY  Chief Complaint Knee Pain   HPI Sean Garza is a 60 y.o. 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  He did not fall and the knee was not at all dislocated.  It seems primarily around the lateral side of the knee and he is not able to move it very much since the fall.  He states he takes Aleve for arthritis every morning and took some this morning but otherwise has not had any pain medicines.  He denies any pain in his right ankle, hip, left lower extremity that is acute or any other acute pain.  No recent fevers, chills, cough, nausea, vomiting, diarrhea, burning with urination, rash or other recent injuries or falls.  No other acute concerns at this time.         Past Medical History:  Diagnosis Date   Asthma    Collagen vascular disease ([place])    Rhematoid Arthritis   GERD (gastroesophageal reflux disease)    Gout    Hx of blood clots    Hypertension    Sleep apnea     Patient Active Problem List   Diagnosis Date Noted   Pain and swelling of lower leg 11/02/2019   Pneumonia due to COVID-19 virus 04/05/2019   Acute hypoxemic respiratory failure ([place]) 04/05/2019   Acute hypoxemic respiratory failure due to severe acute respiratory syndrome coronavirus 2 (SARS-CoV-2) disease ([place]) 04/05/2019   Sleep apnea    Asthma    Tear of left glenoid labrum 03/24/2017   Rotator cuff tendinitis, left 02/28/2017   Complete tear of left rotator cuff 02/28/2017   Lymphedema    History of DVT (deep vein thrombosis)     Family history of coronary artery disease    Obesity, Class III, BMI 40-49.9 (morbid obesity) ([place])    Stress at work    Chest pain 08/06/2015    Past Surgical History:  Procedure Laterality Date   ACHILLES TENDON REPAIR     COLONOSCOPY WITH PROPOFOL N/A 12/13/2016   Procedure: COLONOSCOPY WITH PROPOFOL;  Surgeon: Manya Silvas, MD;  Location: [facility];  Service: Endoscopy;  Laterality: N/A;   EYE SURGERY     floppy eye syndrome surgery   SHOULDER ARTHROSCOPY WITH OPEN ROTATOR CUFF REPAIR Left 03/24/2017   Procedure: SHOULDER ARTHROSCOPY WITH OPEN ROTATOR CUFF REPAIR;  Surgeon: Corky Mull, MD;  Location: [facility] ORS;  Service: Orthopedics;  Laterality: Left;   UVULOPALATOPHARYNGOPLASTY      Prior to Admission medications   Medication Sig Start Date End Date Taking? Authorizing Provider  albuterol (PROVENTIL HFA;VENTOLIN HFA) 108 (90 Base) MCG/ACT inhaler Inhale 2 puffs into the lungs every 6 (six) hours as needed for wheezing or shortness of breath.     [provider]  Ascorbic Acid (VITAMIN C) 1000 MG tablet Take 1,000 mg by mouth daily.    [provider]  budesonide-formoterol (SYMBICORT) 160-4.5 MCG/ACT inhaler Inhale 2 puffs into the lungs 2 (two) times daily as needed (for wheezing or shortness of breath.).  [provider]  ibuprofen (ADVIL) 100 MG/5ML suspension Take 1,000 mg by mouth every 4 (four) hours as needed.    [provider]  sildenafil (REVATIO) 20 MG tablet Take 20 mg by mouth daily. As needed    [provider]  zinc gluconate 50 MG tablet Take 50 mg by mouth daily.    [provider]    Allergies Penicillins  Family History  Problem Relation Age of Onset   CAD Father    CAD Brother     Social History Social History   Tobacco Use   Smoking status: Never   Smokeless tobacco: Never  Vaping Use   Vaping Use: Never used  Substance Use Topics   Alcohol use: No   Drug use: No     Review of Systems  Review of Systems  Constitutional:  Negative for chills and fever.  HENT:  Negative for sore throat.   Eyes:  Negative for pain.  Respiratory:  Negative for cough and stridor.   Cardiovascular:  Negative for chest pain.  Gastrointestinal:  Negative for vomiting.  Genitourinary:  Negative for dysuria.  Musculoskeletal:  Positive for joint pain (R knee).  Skin:  Negative for rash.  Neurological:  Positive for focal weakness (R knee). Negative for seizures, loss of consciousness and headaches.  Psychiatric/Behavioral:  Negative for suicidal ideas.   All other systems reviewed and are negative.    ____________________________________________   PHYSICAL EXAM:  VITAL SIGNS: ED Triage Vitals [01/20/21 1332]  Enc Vitals Group     BP 112/67     Pulse Rate 77     Resp 18     Temp 98.1 F (36.7 C)     Temp Source Oral     SpO2 94 %     Weight (!) 320 lb (145.2 kg)     Height 5\' 7"  (1.702 m)     Head Circumference      Peak Flow      Pain Score 8     Pain Loc      Pain Edu?      Excl. in [place]?    Vitals:   01/20/21 1332  BP: 112/67  Pulse: 77  Resp: 18  Temp: 98.1 F (36.7 C)  SpO2: 94%   Physical Exam Vitals and nursing note reviewed.  Constitutional:      Appearance: He is well-developed. He is obese.  HENT:     Head: Normocephalic and atraumatic.     Right Ear: External ear normal.     Left Ear: External ear normal.     Nose: Nose normal.  Eyes:     Conjunctiva/sclera: Conjunctivae normal.  Cardiovascular:     Rate and Rhythm: Normal rate and regular rhythm.     Heart sounds: No murmur heard. Pulmonary:     Effort: Pulmonary effort is normal. No respiratory distress.     Breath sounds: Normal breath sounds.  Abdominal:     Palpations: Abdomen is soft.     Tenderness: There is no abdominal tenderness.  Musculoskeletal:     Cervical back: Neck supple.  Skin:    General: Skin is warm and dry.     Capillary Refill: Capillary refill  takes less than 2 seconds.  Neurological:     Mental Status: He is alert and oriented to person, place, and time.  Psychiatric:        Mood and Affect: Mood normal.    Right lower extremity.  Warm and well-perfused.  Range of motion is very limited passively at the right knee and patient is only able to barely flex it a couple degrees without significant pain.  There is no deformity or significant effusion compared to the left but there is some tenderness over the patella and lateral aspect.  No significant laxity on anterior posterior lateral or medial drawer testing.  Sensation is intact distally.  He has full strength in his ankle and his foot. ____________________________________________   LABS (all labs ordered are listed, but only abnormal results are displayed)  Labs Reviewed - No data to display ____________________________________________  EKG  ____________________________________________  RADIOLOGY  ED MD interpretation: Plain film of the right knee shows no acute fracture dislocation.  Official radiology report(s): DG Knee Complete 4 Views Right  Result Date: 01/20/2021 CLINICAL DATA:  Knee injury, unable to bear weight EXAM: RIGHT KNEE - COMPLETE 4+ VIEW COMPARISON:  None. FINDINGS: No evidence of fracture or dislocation. Likely small joint effusion. Tricompartmental degenerative changes, with patellar spurring. Soft tissues are unremarkable. IMPRESSION: No acute fracture or dislocation Electronically Signed   By: Merilyn Baba M.D.   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  He has had significant pain and weakness and has not been will bear weight since then.  No other complaints at this time.  He is afebrile and hemodynamically stable on arrival.  He is weak in the right knee and has some tenderness laterally but otherwise there is no deformity and he seems neurovascular intact distally.  X-ray shows no acute fracture dislocation.  I suspect likely ligamentous or other soft tissue injury.  Will place in splint give patient crutches given significant pain and inability to bear any weight at this time.  I will give her Percocet emergency room prescribe short course of this.  Patient instructed to follow-up with orthopedic surgery in clinic for further evaluation to discern possible he require an MRI for further work-up.  I will have him follow-up with Dr. Roland Rack who has previously seen patient.  Discharged in stable condition.  Strict return precautions advised and discussed.       ____________________________________________   FINAL CLINICAL IMPRESSION(S) / ED DIAGNOSES  Final diagnoses:  Acute pain of right knee    Medications  oxyCODONE-acetaminophen (PERCOCET/ROXICET) 5-325 MG per tablet 1 tablet (has no administration in time range)     ED Discharge Orders     None        Note:  This document was prepared using Dragon voice recognition software and may include unintentional dictation errors.    Lucrezia Starch, MD 01/20/21 [phone]

## 2021-05-15 ENCOUNTER — Emergency Department: Payer: BC Managed Care – PPO

## 2021-05-15 ENCOUNTER — Emergency Department
Admission: EM | Admit: 2021-05-15 | Discharge: 2021-05-15 | Disposition: A | Discharge status: Home / Self Care | Payer: BC Managed Care – PPO | Attending: Emergency Medicine | Admitting: Emergency Medicine

## 2021-05-15 ENCOUNTER — Encounter: Payer: Self-pay | Admitting: Emergency Medicine

## 2021-05-15 ENCOUNTER — Other Ambulatory Visit: Payer: Self-pay

## 2021-05-15 DIAGNOSIS — I700 Atherosclerosis of aorta: Secondary | ICD-10-CM | POA: Insufficient documentation

## 2021-05-15 DIAGNOSIS — S2241XA Multiple fractures of ribs, right side, initial encounter for closed fracture: Secondary | ICD-10-CM | POA: Insufficient documentation

## 2021-05-15 DIAGNOSIS — I10 Essential (primary) hypertension: Secondary | ICD-10-CM | POA: Insufficient documentation

## 2021-05-15 DIAGNOSIS — R052 Subacute cough: Secondary | ICD-10-CM | POA: Diagnosis not present

## 2021-05-15 DIAGNOSIS — J40 Bronchitis, not specified as acute or chronic: Secondary | ICD-10-CM | POA: Diagnosis not present

## 2021-05-15 DIAGNOSIS — X58XXXA Exposure to other specified factors, initial encounter: Secondary | ICD-10-CM | POA: Diagnosis not present

## 2021-05-15 DIAGNOSIS — R0981 Nasal congestion: Secondary | ICD-10-CM | POA: Diagnosis not present

## 2021-05-15 DIAGNOSIS — Z20822 Contact with and (suspected) exposure to covid-19: Secondary | ICD-10-CM | POA: Insufficient documentation

## 2021-05-15 LAB — CBC WITH DIFFERENTIAL/PLATELET
Abs Immature Granulocytes: 0.04 10*3/uL (ref 0.00–0.07)
Basophils Absolute: 0 10*3/uL (ref 0.0–0.1)
Basophils Relative: 1 %
Eosinophils Absolute: 0.3 10*3/uL (ref 0.0–0.5)
Eosinophils Relative: 4 %
HCT: 44.3 % (ref 39.0–52.0)
Hemoglobin: 14.3 g/dL (ref 13.0–17.0)
Immature Granulocytes: 1 %
Lymphocytes Relative: 20 %
Lymphs Abs: 1.5 10*3/uL (ref 0.7–4.0)
MCH: 28.4 pg (ref 26.0–34.0)
MCHC: 32.3 g/dL (ref 30.0–36.0)
MCV: 87.9 fL (ref 80.0–100.0)
Monocytes Absolute: 0.6 10*3/uL (ref 0.1–1.0)
Monocytes Relative: 8 %
Neutro Abs: 5.3 10*3/uL (ref 1.7–7.7)
Neutrophils Relative %: 66 %
Platelets: 271 10*3/uL (ref 150–400)
RBC: 5.04 MIL/uL (ref 4.22–5.81)
RDW: 14.6 % (ref 11.5–15.5)
WBC: 7.7 10*3/uL (ref 4.0–10.5)
nRBC: 0 % (ref 0.0–0.2)

## 2021-05-15 LAB — COMPREHENSIVE METABOLIC PANEL
ALT: 18 U/L (ref 0–44)
AST: 16 U/L (ref 15–41)
Albumin: 3.8 g/dL (ref 3.5–5.0)
Alkaline Phosphatase: 76 U/L (ref 38–126)
Anion gap: 6 (ref 5–15)
BUN: 21 mg/dL — ABNORMAL HIGH (ref 6–20)
CO2: 26 mmol/L (ref 22–32)
Calcium: 8.7 mg/dL — ABNORMAL LOW (ref 8.9–10.3)
Chloride: 106 mmol/L (ref 98–111)
Creatinine, Ser: 0.84 mg/dL (ref 0.61–1.24)
GFR, Estimated: >60 mL/min (ref >60)
Glucose, Bld: 103 mg/dL — ABNORMAL HIGH (ref 70–99)
Potassium: 4.1 mmol/L (ref 3.5–5.1)
Sodium: 138 mmol/L (ref 135–145)
Total Bilirubin: 0.7 mg/dL (ref 0.3–1.2)
Total Protein: 8.5 g/dL — ABNORMAL HIGH (ref 6.5–8.1)

## 2021-05-15 LAB — RESP PANEL BY RT-PCR (FLU A&B, COVID) ARPGX2
Influenza A by PCR: NEGATIVE
Influenza B by PCR: NEGATIVE
SARS Coronavirus 2 by RT PCR: NEGATIVE

## 2021-05-15 LAB — PROCALCITONIN: Procalcitonin: <0.1 ng/mL

## 2021-05-15 LAB — TROPONIN I (HIGH SENSITIVITY)
Troponin I (High Sensitivity): 4 ng/L (ref <18)
Troponin I (High Sensitivity): 3 ng/L (ref <18)

## 2021-05-15 MED ORDER — DOXYCYCLINE HYCLATE 100 MG PO CAPS: 100.0000 mg | ORAL_CAPSULE | Freq: Two times a day (BID) | ORAL | 0 refills | Status: AC

## 2021-05-15 MED ORDER — LIDOCAINE 5 % EX PTCH
1.0000 | MEDICATED_PATCH | CUTANEOUS | Status: DC
  Administered 2021-05-15: 1 via TRANSDERMAL
  Filled 2021-05-15: qty 1

## 2021-05-15 MED ORDER — IOHEXOL 350 MG/ML SOLN
75.0000 mL | Freq: Once | INTRAVENOUS | Status: AC | PRN
  Administered 2021-05-15: 75 mL via INTRAVENOUS

## 2021-05-15 MED ORDER — OXYCODONE-ACETAMINOPHEN 5-325 MG PO TABS
1.0000 | ORAL_TABLET | Freq: Once | ORAL | Status: AC
  Administered 2021-05-15: 1 via ORAL
  Filled 2021-05-15: qty 1

## 2021-05-15 MED ORDER — GABAPENTIN 600 MG PO TABS
300.0000 mg | ORAL_TABLET | ORAL | Status: AC
  Administered 2021-05-15: 300 mg via ORAL
  Filled 2021-05-15: qty 1

## 2021-05-15 MED ORDER — GABAPENTIN 300 MG PO CAPS: 300.0000 mg | ORAL_CAPSULE | Freq: Two times a day (BID) | ORAL | 0 refills | Status: DC | PRN

## 2021-05-15 MED ORDER — OXYCODONE-ACETAMINOPHEN 5-325 MG PO TABS: 1.0000 | ORAL_TABLET | Freq: Three times a day (TID) | ORAL | 0 refills | Status: AC | PRN

## 2021-05-15 MED ORDER — KETOROLAC TROMETHAMINE 30 MG/ML IJ SOLN
15.0000 mg | Freq: Once | INTRAMUSCULAR | Status: AC
  Administered 2021-05-15: 15 mg via INTRAVENOUS
  Filled 2021-05-15: qty 1

## 2021-05-15 MED ORDER — MORPHINE SULFATE (PF) 4 MG/ML IV SOLN
4.0000 mg | Freq: Once | INTRAVENOUS | Status: AC
  Administered 2021-05-15: 4 mg via INTRAVENOUS
  Filled 2021-05-15: qty 1

## 2021-05-15 NOTE — ED Provider Notes (Signed)
? ?[facility] ?Provider Note ? ? ? Event Date/Time  ? First MD Initiated Contact with Patient 05/15/21 [phone]   ?  (approximate) ? ? ?History  ? ?Chest Pain ? ? ?HPI ? ?Sean Garza is a 61 y.o. male with a past medical history of RA, GERD, gout, HTN, OSA, and a DVT who presents for evaluation of some acute right-sided chest pain in the setting of subacute to chronic cough and sneezing.  Patient states he has been feeling congested with a cough and some mild shortness of breath since December but feels that Wednesday he had particular bad episode of coughing and sneezing and subsequently developed some acute right-sided chest pain.  Denies any left-sided symptoms, back pain, headache, earache, sore throat, fevers, vomiting, diarrhea, abdominal pain, urinary symptoms, rash or extremity pain.  No current tobacco abuse or any other clear associated sick symptoms. ? ?  ? ? ?Physical Exam  ?Triage Vital Signs: ?ED Triage Vitals  ?Enc Vitals Group  ?   BP 05/15/21 0802 (!) 173/99  ?   Pulse Rate 05/15/21 0802 81  ?   Resp 05/15/21 0802 20  ?   Temp 05/15/21 0802 98.6 ?F (37 ?C)  ?   Temp Source 05/15/21 0802 Oral  ?   SpO2 05/15/21 0802 92 %  ?   Weight 05/15/21 0803 (!) 320 lb 1.7 oz (145.2 kg)  ?   Height 05/15/21 0803 5\' 7"  (1.702 m)  ?   Head Circumference --   ?   Peak Flow --   ?   Pain Score 05/15/21 0803 9  ?   Pain Loc --   ?   Pain Edu? --   ?   Excl. in [place]? --   ? ? ?Most recent vital signs: ?Vitals:  ? 05/15/21 1100 05/15/21 1115  ?BP: 121/78   ?Pulse: 63 62  ?Resp: 17 14  ?Temp:    ?SpO2: 94% 93%  ? ? ?General: Awake, no distress.  ?CV:  Good peripheral perfusion.  2+ radial pulse. ?Resp:  Normal effort.  Clear bilaterally without wheezing rhonchi or rales.  No tachypnea noted.  Patient was found to have an SPO2 of 90% in triage is known to have an SPO2 of 96% on my assessment with respiratory rate of 18. ?Abd:  No distention.  Soft throughout. ?Other:  No rash over the right side of  the chest.  There is some tenderness over the right chest ? ? ?ED Results / Procedures / Treatments  ?Labs ?(all labs ordered are listed, but only abnormal results are displayed) ?Labs Reviewed  ?COMPREHENSIVE METABOLIC PANEL - Abnormal; Notable for the following components:  ?    Result Value  ? Glucose, Bld 103 (*)   ? BUN 21 (*)   ? Calcium 8.7 (*)   ? Total Protein 8.5 (*)   ? All other components within normal limits  ?RESP PANEL BY RT-PCR (FLU A&B, COVID) ARPGX2  ?CBC WITH DIFFERENTIAL/PLATELET  ?PROCALCITONIN  ?TROPONIN I (HIGH SENSITIVITY)  ?TROPONIN I (HIGH SENSITIVITY)  ? ? ? ?EKG ? ?ECG is remarkable sinus rhythm with a ventricular rate of 83 and some nonspecific ST change in lead III without other clear evidence of acute ischemia or significant arrhythmia. ? ? ?RADIOLOGY ?Chest reviewed by myself shows no focal consoidation, effusion, edema, pneumothorax or other clear acute thoracic process. I also reviewed radiology interpretation and agree with findings described. ? ?CTA chest, interpretation without evidence of large PE or  clear focal consolidation or obvious pneumothorax.  I also reviewed radiology interpretation and agree with their findings of no evidence of PE but acute fractures of the right eighth and ninth ribs and some subpleural thickening and interstitial haziness in the right upper lobes as well as aortic atherosclerosis but no other acute process. ? ?PROCEDURES: ? ?Critical Care performed: No ? ?.1-3 Lead EKG Interpretation ?Performed by: Lucrezia Starch, MD ?Authorized by: Lucrezia Starch, MD  ? ?  Interpretation: normal   ?  ECG rate assessment: normal   ?  Rhythm: sinus rhythm   ?  Ectopy: none   ?  Conduction: normal   ? ?The patient is on the cardiac monitor to evaluate for evidence of arrhythmia and/or significant heart rate changes. ? ? ?MEDICATIONS ORDERED IN ED: ?Medications  ?lidocaine (LIDODERM) 5 % 1 patch (1 patch Transdermal Patch Applied 05/15/21 1217)  ?ketorolac (TORADOL)  30 MG/ML injection 15 mg (15 mg Intravenous Given 05/15/21 0912)  ?morphine (PF) 4 MG/ML injection 4 mg (4 mg Intravenous Given 05/15/21 0916)  ?iohexol (OMNIPAQUE) 350 MG/ML injection 75 mL (75 mLs Intravenous Contrast Given 05/15/21 1034)  ?oxyCODONE-acetaminophen (PERCOCET/ROXICET) 5-325 MG per tablet 1 tablet (1 tablet Oral Given 05/15/21 1133)  ?gabapentin (NEURONTIN) tablet 300 mg (300 mg Oral Given 05/15/21 1219)  ? ? ? ?IMPRESSION / MDM / ASSESSMENT AND PLAN / ED COURSE  ?I reviewed the triage vital signs and the nursing notes. ?             ?               ? ?Differential diagnosis includes, but is not limited to pathologic rib fracture, pulm muscle, PE, symptomatic effusion, pneumonia, pneumothorax, atypical ACS without evidence on history or exam at this time for shingles or cellulitis or trauma. ? ?ECG is remarkable sinus rhythm with a ventricular rate of 83 and some nonspecific ST change in lead III without other clear evidence of acute ischemia or significant arrhythmia.  Given nonelevated troponin x2 have a low suspicion for ACS or myocarditis. ? ?Chest reviewed by myself shows no focal consoidation, effusion, edema, pneumothorax or other clear acute thoracic process. I also reviewed radiology interpretation and agree with findings described. ? ?CTA chest, interpretation without evidence of large PE or clear focal consolidation or obvious pneumothorax.  I also reviewed radiology interpretation and agree with their findings of no evidence of PE but acute fractures of the right eighth and ninth ribs and some subpleural thickening and interstitial haziness in the right upper lobes as well as aortic atherosclerosis but no other acute process. ? ?COVID and influenza PCR is negative.  CBC without leukocytosis or acute anemia.  CMP shows no significant electrolyte or metabolic derangements.  Procalcitonin is undetectable. ? ?Unclear etiology for patient's chronic cough or congestion think is reasonable to cover with a  short course of doxycycline for possible atypical pneumonia.  Otherwise he may require further work-up with PCP.  I suspect the etiology of his acute right-sided chest pain is right-sided rib fractures which are apparently nontraumatic and possibly pathologic as he states it started when he was sneezing and coughing.  He is not otherwise hypoxic and is able to ambulate maintain SPO2 greater than 95%.  He was given a sinus parameter instructed on use.  I considered admission and observation given stable vitals with no evidence of hypoxia or respiratory failure and pain well controlled think he is stable for discharge with outpatient follow-up. ? ? ? ?  FINAL CLINICAL IMPRESSION(S) / ED DIAGNOSES  ? ?Final diagnoses:  ?Closed fracture of multiple ribs of right side, initial encounter  ?Bronchitis  ?Aortic atherosclerosis ([place])  ?Subacute cough  ? ? ? ?Rx / DC Orders  ? ?ED Discharge Orders   ? ? None  ? ?  ? ? ? ?Note:  This document was prepared using Dragon voice recognition software and may include unintentional dictation errors. ?  ?Lucrezia Starch, MD ?05/15/21 1246 ? ?

## 2021-05-15 NOTE — ED Notes (Signed)
Pt. In rad ? ?

## 2021-05-15 NOTE — ED Notes (Addendum)
Pt. Ambulated in hall, gait steady, NAD. O2 sat of 94-95% maintained. MD notified. ?

## 2021-05-15 NOTE — ED Notes (Signed)
Pt. States he drove himself to the ED, but his son will come pick him up. Pt. Notified he has taken narcotics, and is not to drive for 6 hours. ?

## 2021-05-15 NOTE — ED Notes (Signed)
Pt. Returned from rad. ?

## 2021-05-15 NOTE — ED Triage Notes (Addendum)
C/O right rib pain.  Pain has been ongoing since December.  Patient states from coughing and sneezing.  Pain Worsened again on Wednesday after sneezing.  STates last night pain felt like something was catching with each exhale.  Pain worse with coughing, sneezing, exhale, movement. ? ?Had [place] on 2021, was hospitalized at [facility] for 12 days. ?

## 2021-12-29 DIAGNOSIS — R071 Chest pain on breathing: Secondary | ICD-10-CM | POA: Diagnosis not present

## 2022-01-11 ENCOUNTER — Encounter (INDEPENDENT_AMBULATORY_CARE_PROVIDER_SITE_OTHER): Payer: Self-pay

## 2022-04-05 DIAGNOSIS — I10 Essential (primary) hypertension: Secondary | ICD-10-CM | POA: Diagnosis not present

## 2022-04-05 DIAGNOSIS — Z125 Encounter for screening for malignant neoplasm of prostate: Secondary | ICD-10-CM | POA: Diagnosis not present

## 2022-04-07 DIAGNOSIS — J019 Acute sinusitis, unspecified: Secondary | ICD-10-CM | POA: Diagnosis not present

## 2022-04-07 DIAGNOSIS — R7309 Other abnormal glucose: Secondary | ICD-10-CM | POA: Diagnosis not present

## 2022-04-07 DIAGNOSIS — I10 Essential (primary) hypertension: Secondary | ICD-10-CM | POA: Diagnosis not present

## 2022-09-28 DIAGNOSIS — L820 Inflamed seborrheic keratosis: Secondary | ICD-10-CM | POA: Diagnosis not present

## 2022-09-28 DIAGNOSIS — L821 Other seborrheic keratosis: Secondary | ICD-10-CM | POA: Diagnosis not present

## 2022-10-04 DIAGNOSIS — R7309 Other abnormal glucose: Secondary | ICD-10-CM | POA: Diagnosis not present

## 2022-10-04 DIAGNOSIS — I10 Essential (primary) hypertension: Secondary | ICD-10-CM | POA: Diagnosis not present

## 2022-10-07 DIAGNOSIS — I10 Essential (primary) hypertension: Secondary | ICD-10-CM | POA: Diagnosis not present

## 2022-10-07 DIAGNOSIS — R7309 Other abnormal glucose: Secondary | ICD-10-CM | POA: Diagnosis not present

## 2022-12-27 DIAGNOSIS — I10 Essential (primary) hypertension: Secondary | ICD-10-CM | POA: Diagnosis not present

## 2023-01-24 DIAGNOSIS — I10 Essential (primary) hypertension: Secondary | ICD-10-CM | POA: Diagnosis not present

## 2023-02-13 ENCOUNTER — Emergency Department: Payer: BC Managed Care – PPO

## 2023-02-13 ENCOUNTER — Other Ambulatory Visit: Payer: Self-pay

## 2023-02-13 ENCOUNTER — Emergency Department
Admission: EM | Admit: 2023-02-13 | Discharge: 2023-02-13 | Disposition: A | Discharge status: Home / Self Care | Payer: BC Managed Care – PPO | Attending: Emergency Medicine | Admitting: Emergency Medicine

## 2023-02-13 DIAGNOSIS — I82432 Acute embolism and thrombosis of left popliteal vein: Secondary | ICD-10-CM

## 2023-02-13 DIAGNOSIS — M19072 Primary osteoarthritis, left ankle and foot: Secondary | ICD-10-CM | POA: Insufficient documentation

## 2023-02-13 DIAGNOSIS — I10 Essential (primary) hypertension: Secondary | ICD-10-CM | POA: Diagnosis not present

## 2023-02-13 DIAGNOSIS — I82442 Acute embolism and thrombosis of left tibial vein: Secondary | ICD-10-CM | POA: Diagnosis not present

## 2023-02-13 DIAGNOSIS — J45909 Unspecified asthma, uncomplicated: Secondary | ICD-10-CM | POA: Insufficient documentation

## 2023-02-13 DIAGNOSIS — M79605 Pain in left leg: Secondary | ICD-10-CM | POA: Insufficient documentation

## 2023-02-13 DIAGNOSIS — M7989 Other specified soft tissue disorders: Secondary | ICD-10-CM | POA: Diagnosis not present

## 2023-02-13 DIAGNOSIS — M25572 Pain in left ankle and joints of left foot: Secondary | ICD-10-CM | POA: Diagnosis not present

## 2023-02-13 MED ORDER — OXYCODONE HCL 5 MG PO TABS
5.0000 mg | ORAL_TABLET | Freq: Once | ORAL | Status: AC
  Administered 2023-02-13: 5 mg via ORAL
  Filled 2023-02-13: qty 1

## 2023-02-13 MED ORDER — APIXABAN 5 MG PO TABS: 5.0000 mg | ORAL_TABLET | Freq: Two times a day (BID) | ORAL | 1 refills | Status: DC

## 2023-02-13 MED ORDER — OXYCODONE HCL 5 MG PO TABS: 5.0000 mg | ORAL_TABLET | Freq: Three times a day (TID) | ORAL | 0 refills | Status: DC | PRN

## 2023-02-13 MED ORDER — OXYCODONE HCL 5 MG PO TABS
5.0000 mg | ORAL_TABLET | Freq: Three times a day (TID) | ORAL | 0 refills | Status: DC | PRN
Start: 2023-02-13 — End: 2023-02-13

## 2023-02-13 MED ORDER — APIXABAN 5 MG PO TABS
5.0000 mg | ORAL_TABLET | Freq: Once | ORAL | Status: AC
Start: 2023-02-13 — End: 2023-02-13
  Administered 2023-02-13: 5 mg via ORAL
  Filled 2023-02-13: qty 1

## 2023-02-13 NOTE — Discharge Instructions (Addendum)
Please take your blood thinner as prescribed.  Please follow-up with your doctor within the next 2 days for recheck/reevaluation.  Please take your pain medication if needed but only as prescribed.  Do not drink alcohol or drive while taking pain medication.

## 2023-02-13 NOTE — ED Triage Notes (Addendum)
Pt comes with left ankle pain. Pt states he is unsure if he injured it. Pt states he did do a lot of walking not too long ago during a family trip.   Pt states feels like needles and radiates up his leg.

## 2023-02-13 NOTE — ED Provider Notes (Signed)
[facility] Provider Note    Event Date/Time   First MD Initiated Contact with Patient 02/13/23 1112     (approximate)  History   Chief Complaint: Ankle Pain  HPI  Sean Garza is a 62 y.o. male with a past medical history of asthma, gastric reflux, hypertension, prior DVT who presents to the emergency department for left leg/ankle pain.  According to the patient for the past 2 months he has been experiencing pain in the left ankle that occasionally radiates up the leg.  Patient states he saw his doctor and did a short course of prednisone which seemed to help quite a bit however after he stopped the medication the pain came back.  Patient also has a history of a prior DVT in the right lower extremity, is not on any anticoagulation and was concerned given the left leg pain.  No reported chest pain or shortness of breath.  Physical Exam   Triage Vital Signs: ED Triage Vitals  Encounter Vitals Group     BP 02/13/23 1038 (!) 165/104     Systolic BP Percentile --      Diastolic BP Percentile --      Pulse Rate 02/13/23 1038 97     Resp 02/13/23 1038 18     Temp 02/13/23 1038 97.9 F (36.6 C)     Temp src --      SpO2 02/13/23 1038 94 %     Weight --      Height --      Head Circumference --      Peak Flow --      Pain Score 02/13/23 1039 10     Pain Loc --      Pain Education --      Exclude from Growth Chart --     Most recent vital signs: Vitals:   02/13/23 1038  BP: (!) 165/104  Pulse: 97  Resp: 18  Temp: 97.9 F (36.6 C)  SpO2: 94%    General: Awake, no distress.  CV:  Good peripheral perfusion.  Regular rate and rhythm  Resp:  Normal effort.  Equal breath sounds bilaterally.  Abd:  No distention.   Other:  Mild tenderness to palpation especially of the lateral malleolus of the left ankle but good range of motion, neurovascularly intact.   ED Results / Procedures / Treatments   RADIOLOGY  I have reviewed and interpreted the  ankle x-ray images.  On my evaluation I do not appreciate any fracture or dislocation.   MEDICATIONS ORDERED IN ED: Medications - No data to display   IMPRESSION / MDM / ASSESSMENT AND PLAN / ED COURSE  I reviewed the triage vital signs and the nursing notes.  Patient's presentation is most consistent with acute illness / injury with system symptoms.  Patient presents to the emergency department for left leg pain mostly in the lateral ankle, worse with ambulation.  Patient states a history of gout also a history of DVT.  X-ray does not appear to show any fracture or dislocation.  Will obtain an ultrasound to rule out DVT.  If the ultrasound is negative I believe a longer taper of prednisone will be warranted given likely arthritis gouty versus osteoarthritis.  We would also have the patient follow-up with orthopedics given the duration of his symptoms for further evaluation.  Patient agreeable to plan.  Ankle x-ray read as degenerative changes without acute process.  Patient's ultrasound shows nonocclusive thrombus in the popliteal  vein and possible occlusive thrombus in the peroneal vein.  Patient states 20+ years ago when he last had a DVT they put him on blood thinners but he developed hematuria so they took him off blood thinners.  Given the patient's left leg pain with ultrasound findings we will place back on blood thinners but have the patient follow-up closely with his doctor.  FINAL CLINICAL IMPRESSION(S) / ED DIAGNOSES   Left ankle pain Arthritis   Note:  This document was prepared using Dragon voice recognition software and may include unintentional dictation errors.   Minna Antis, MD 02/13/23 1255

## 2023-03-02 ENCOUNTER — Other Ambulatory Visit (INDEPENDENT_AMBULATORY_CARE_PROVIDER_SITE_OTHER): Payer: Self-pay | Admitting: Nurse Practitioner

## 2023-03-02 DIAGNOSIS — I82432 Acute embolism and thrombosis of left popliteal vein: Secondary | ICD-10-CM

## 2023-03-08 ENCOUNTER — Ambulatory Visit (INDEPENDENT_AMBULATORY_CARE_PROVIDER_SITE_OTHER): Payer: BC Managed Care – PPO | Admitting: Nurse Practitioner

## 2023-03-08 ENCOUNTER — Ambulatory Visit (INDEPENDENT_AMBULATORY_CARE_PROVIDER_SITE_OTHER): Payer: BC Managed Care – PPO

## 2023-03-08 ENCOUNTER — Encounter (INDEPENDENT_AMBULATORY_CARE_PROVIDER_SITE_OTHER): Payer: Self-pay | Admitting: Nurse Practitioner

## 2023-03-08 VITALS — BP 141/90 | HR 71 | Resp 18 | Ht 68.0 in | Wt 339.0 lb

## 2023-03-08 DIAGNOSIS — Z86718 Personal history of other venous thrombosis and embolism: Secondary | ICD-10-CM | POA: Diagnosis not present

## 2023-03-08 DIAGNOSIS — G8929 Other chronic pain: Secondary | ICD-10-CM | POA: Diagnosis not present

## 2023-03-08 DIAGNOSIS — I82432 Acute embolism and thrombosis of left popliteal vein: Secondary | ICD-10-CM

## 2023-03-08 DIAGNOSIS — M25572 Pain in left ankle and joints of left foot: Secondary | ICD-10-CM

## 2023-03-14 NOTE — Progress Notes (Signed)
Subjective:    Patient ID: Sean Garza, male    DOB: 1960-08-09, 62 y.o.   MRN: [id number] Chief Complaint  Patient presents with   New Patient (Initial Visit)    NP. LS GS 2021. consult. LLE popliteal DVT. tate    The patient is a 62 year old male who presents today for evaluation of DVT in the left lower extremity.  He notes that he went because he was having pain in his ankle and calf and this prompted emergency evaluation.  He notes that the swelling has improved but the pain in his lower extremity has not.  The pain is more so concentrated within the ankle itself but he still continues to have difficulty with movement.  He notes is worse after sitting for long periods.  Previously has had a DVT in his right lower extremity but this was felt to be provoked.  Recurrent left lower extremity DVT denies any prolonged travels, trauma, recent COVID infection or any other precipitating event that may cause this to be considered provoked.  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  It shows recannulization through the popliteal.  He currently is on Eliquis without any issues.    Review of Systems  Musculoskeletal:  Positive for arthralgias.  All other systems reviewed and are negative.      Objective:   Physical Exam Vitals reviewed.  HENT:     Head: Normocephalic.  Cardiovascular:     Rate and Rhythm: Normal rate.  Pulmonary:     Effort: Pulmonary effort is normal.  Musculoskeletal:     Left lower leg: Edema present.  Skin:    General: Skin is warm and dry.  Neurological:     Mental Status: He is alert and oriented to person, place, and time.  Psychiatric:        Mood and Affect: Mood normal.        Behavior: Behavior normal.        Thought Content: Thought content normal.        Judgment: Judgment normal.     BP (!) 141/90   Pulse 71   Resp 18   Ht 5\' 8"  (1.727 m)   Wt (!) 339 lb (153.8 kg)   BMI 51.54 kg/m   Past Medical  History:  Diagnosis Date   Asthma    Collagen vascular disease (HCC)    Rhematoid Arthritis   GERD (gastroesophageal reflux disease)    Gout    Hx of blood clots    Hypertension    Sleep apnea     Social History   Socioeconomic History   Marital status: Single    Spouse name: Not on file   Number of children: Not on file   Years of education: Not on file   Highest education level: Not on file  Occupational History   Not on file  Tobacco Use   Smoking status: Never   Smokeless tobacco: Never  Vaping Use   Vaping status: Never Used  Substance and Sexual Activity   Alcohol use: No   Drug use: No   Sexual activity: Not on file  Other Topics Concern   Not on file  Social History Narrative   Not on file   Social Drivers of Health   Financial Resource Strain: Not on file  Food Insecurity: Not on file  Transportation Needs: Not on file  Physical Activity: Not on file  Stress: Not on file  Social Connections:  Unknown (07/28/2021)   [facility]   Social Network    Social Network: Not on file  Intimate Partner Violence: Unknown (06/18/2021)   Received from [facility]   HITS    Physically Hurt: Not on file    Insult or Talk Down To: Not on file    Threaten Physical Harm: Not on file    Scream or Curse: Not on file    Past Surgical History:  Procedure Laterality Date   ACHILLES TENDON REPAIR     COLONOSCOPY WITH PROPOFOL N/A 12/13/2016   Procedure: COLONOSCOPY WITH PROPOFOL;  Surgeon: Scot Jun, MD;  Location: [facility] ENDOSCOPY;  Service: Endoscopy;  Laterality: N/A;   EYE SURGERY     floppy eye syndrome surgery   SHOULDER ARTHROSCOPY WITH OPEN ROTATOR CUFF REPAIR Left 03/24/2017   Procedure: SHOULDER ARTHROSCOPY WITH OPEN ROTATOR CUFF REPAIR;  Surgeon: Christena Flake, MD;  Location: [facility] ORS;  Service: Orthopedics;  Laterality: Left;   UVULOPALATOPHARYNGOPLASTY      Family History  Problem Relation Age of Onset   CAD Father    CAD Brother      Allergies  Allergen Reactions   Penicillins Other (See Comments)    Childhood reaction. Has patient had a PCN reaction causing immediate rash, facial/tongue/throat swelling, SOB or lightheadedness with hypotension: Unknown Has patient had a PCN reaction causing severe rash involving mucus membranes or skin necrosis: Unknown Has patient had a PCN reaction that required hospitalization: No Has patient had a PCN reaction occurring within the last 10 years: No If all of the above answers are "NO", then may proceed with Cephalosporin use.        Latest Ref Rng & Units 05/15/2021    9:15 AM 10/13/2019    1:08 PM 04/17/2019    5:36 AM  CBC  WBC 4.0 - 10.5 K/uL 7.7  7.4  11.1   Hemoglobin 13.0 - 17.0 g/dL 91.4  78.2  95.6   Hematocrit 39.0 - 52.0 % 44.3  41.2  46.0   Platelets 150 - 400 K/uL 271  262  271       CMP     Component Value Date/Time   NA 138 05/15/2021 0915   NA 142 05/24/2011 0057   K 4.1 05/15/2021 0915   K 4.1 05/24/2011 0057   CL 106 05/15/2021 0915   CL 103 05/24/2011 0057   CO2 26 05/15/2021 0915   CO2 25 05/24/2011 0057   GLUCOSE 103 (H) 05/15/2021 0915   GLUCOSE 96 05/24/2011 0057   BUN 21 (H) 05/15/2021 0915   BUN 16 05/24/2011 0057   CREATININE 0.84 05/15/2021 0915   CREATININE 0.83 05/24/2011 0057   CALCIUM 8.7 (L) 05/15/2021 0915   CALCIUM 8.6 05/24/2011 0057   PROT 8.5 (H) 05/15/2021 0915   ALBUMIN 3.8 05/15/2021 0915   AST 16 05/15/2021 0915   ALT 18 05/15/2021 0915   ALKPHOS 76 05/15/2021 0915   BILITOT 0.7 05/15/2021 0915   GFRNONAA >60 05/15/2021 0915   GFRNONAA >60 05/24/2011 0057     No results found.     Assessment & Plan:   1. History of DVT (deep vein thrombosis) (Primary) Today noninvasive studies show that the patient's DVT is progressing from the acute to chronic state which is expected.  The studies are improved from the initial DVT diagnosis.  However because this is the patient's second DVT and this 1 is thought to be  largely unprovoked, I feel it would be  prudent for him to is a hematology evaluate if he does have an underlying hypercoagulable disorder when determining ongoing anticoagulation.  Will have the patient return in 6 months or sooner if issues arise.  2. 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  I suspect that the inflammatory change from the DVT itself may also be contributing and worsening this.  Will refer the patient to podiatry in order to evaluate his ankle as well as possible treatment options.   Current Outpatient Medications on File Prior to Visit  Medication Sig Dispense Refill   albuterol (PROVENTIL HFA;VENTOLIN HFA) 108 (90 Base) MCG/ACT inhaler Inhale 2 puffs into the lungs every 6 (six) hours as needed for wheezing or shortness of breath.      apixaban (ELIQUIS) 5 MG TABS tablet Take 1 tablet (5 mg total) by mouth 2 (two) times daily. 60 tablet 1   Ascorbic Acid (VITAMIN C) 1000 MG tablet Take 1,000 mg by mouth daily.     budesonide-formoterol (SYMBICORT) 160-4.5 MCG/ACT inhaler Inhale 2 puffs into the lungs 2 (two) times daily as needed (for wheezing or shortness of breath.).      esomeprazole (NEXIUM) 20 MG capsule Take by mouth.     gabapentin (NEURONTIN) 300 MG capsule Take 1 capsule (300 mg total) by mouth 2 (two) times daily as needed for up to 7 days. 14 capsule 0   hydrochlorothiazide (HYDRODIURIL) 25 MG tablet Take 25 mg by mouth every morning.     ibuprofen (ADVIL) 100 MG/5ML suspension Take 1,000 mg by mouth every 4 (four) hours as needed.     oxyCODONE (ROXICODONE) 5 MG immediate release tablet Take 1 tablet (5 mg total) by mouth every 8 (eight) hours as needed. 15 tablet 0   sildenafil (REVATIO) 20 MG tablet Take 20 mg by mouth daily. As needed     valsartan (DIOVAN) 160 MG tablet Take 160 mg by mouth daily.      zinc gluconate 50 MG tablet Take 50 mg by mouth daily.     No current facility-administered medications on file prior to visit.    There are no Patient Instructions on file for this visit. No follow-ups on file.   Georgiana Spinner, NP

## 2023-03-25 ENCOUNTER — Encounter: Payer: Self-pay | Admitting: Oncology

## 2023-03-25 ENCOUNTER — Inpatient Hospital Stay: Payer: BC Managed Care – PPO | Attending: Oncology | Admitting: Oncology

## 2023-03-25 ENCOUNTER — Inpatient Hospital Stay: Payer: BC Managed Care – PPO

## 2023-03-25 VITALS — BP 173/99 | HR 75 | Temp 97.9°F | Resp 18 | Wt 335.0 lb

## 2023-03-25 DIAGNOSIS — R600 Localized edema: Secondary | ICD-10-CM | POA: Insufficient documentation

## 2023-03-25 DIAGNOSIS — Z880 Allergy status to penicillin: Secondary | ICD-10-CM | POA: Diagnosis not present

## 2023-03-25 DIAGNOSIS — M8580 Other specified disorders of bone density and structure, unspecified site: Secondary | ICD-10-CM | POA: Diagnosis not present

## 2023-03-25 DIAGNOSIS — Z8249 Family history of ischemic heart disease and other diseases of the circulatory system: Secondary | ICD-10-CM | POA: Diagnosis not present

## 2023-03-25 DIAGNOSIS — I82442 Acute embolism and thrombosis of left tibial vein: Secondary | ICD-10-CM | POA: Diagnosis not present

## 2023-03-25 DIAGNOSIS — E669 Obesity, unspecified: Secondary | ICD-10-CM | POA: Diagnosis not present

## 2023-03-25 DIAGNOSIS — I824Y2 Acute embolism and thrombosis of unspecified deep veins of left proximal lower extremity: Secondary | ICD-10-CM | POA: Diagnosis not present

## 2023-03-25 DIAGNOSIS — R2240 Localized swelling, mass and lump, unspecified lower limb: Secondary | ICD-10-CM | POA: Diagnosis not present

## 2023-03-25 DIAGNOSIS — M2550 Pain in unspecified joint: Secondary | ICD-10-CM | POA: Insufficient documentation

## 2023-03-25 DIAGNOSIS — Z86718 Personal history of other venous thrombosis and embolism: Secondary | ICD-10-CM | POA: Diagnosis not present

## 2023-03-25 DIAGNOSIS — I82432 Acute embolism and thrombosis of left popliteal vein: Secondary | ICD-10-CM | POA: Insufficient documentation

## 2023-03-25 DIAGNOSIS — Z79899 Other long term (current) drug therapy: Secondary | ICD-10-CM | POA: Insufficient documentation

## 2023-03-25 DIAGNOSIS — M7989 Other specified soft tissue disorders: Secondary | ICD-10-CM | POA: Diagnosis not present

## 2023-03-25 DIAGNOSIS — Z801 Family history of malignant neoplasm of trachea, bronchus and lung: Secondary | ICD-10-CM | POA: Diagnosis not present

## 2023-03-25 DIAGNOSIS — M79605 Pain in left leg: Secondary | ICD-10-CM | POA: Insufficient documentation

## 2023-03-25 DIAGNOSIS — M25572 Pain in left ankle and joints of left foot: Secondary | ICD-10-CM | POA: Insufficient documentation

## 2023-03-25 DIAGNOSIS — Z7901 Long term (current) use of anticoagulants: Secondary | ICD-10-CM | POA: Insufficient documentation

## 2023-03-25 DIAGNOSIS — M109 Gout, unspecified: Secondary | ICD-10-CM | POA: Insufficient documentation

## 2023-03-25 LAB — CBC WITH DIFFERENTIAL/PLATELET
Abs Immature Granulocytes: 0.03 10*3/uL (ref 0.00–0.07)
Basophils Absolute: 0.1 10*3/uL (ref 0.0–0.1)
Basophils Relative: 1 %
Eosinophils Absolute: 0.2 10*3/uL (ref 0.0–0.5)
Eosinophils Relative: 4 %
HCT: 42.4 % (ref 39.0–52.0)
Hemoglobin: 13.9 g/dL (ref 13.0–17.0)
Immature Granulocytes: 1 %
Lymphocytes Relative: 21 %
Lymphs Abs: 1.3 10*3/uL (ref 0.7–4.0)
MCH: 28.9 pg (ref 26.0–34.0)
MCHC: 32.8 g/dL (ref 30.0–36.0)
MCV: 88.1 fL (ref 80.0–100.0)
Monocytes Absolute: 0.4 10*3/uL (ref 0.1–1.0)
Monocytes Relative: 6 %
Neutro Abs: 4.2 10*3/uL (ref 1.7–7.7)
Neutrophils Relative %: 67 %
Platelets: 241 10*3/uL (ref 150–400)
RBC: 4.81 MIL/uL (ref 4.22–5.81)
RDW: 14.9 % (ref 11.5–15.5)
WBC: 6.2 10*3/uL (ref 4.0–10.5)
nRBC: 0 % (ref 0.0–0.2)

## 2023-03-25 LAB — COMPREHENSIVE METABOLIC PANEL
ALT: 18 U/L (ref 0–44)
AST: 20 U/L (ref 15–41)
Albumin: 3.9 g/dL (ref 3.5–5.0)
Alkaline Phosphatase: 65 U/L (ref 38–126)
Anion gap: 7 (ref 5–15)
BUN: 18 mg/dL (ref 8–23)
CO2: 24 mmol/L (ref 22–32)
Calcium: 9 mg/dL (ref 8.9–10.3)
Chloride: 107 mmol/L (ref 98–111)
Creatinine, Ser: 1.03 mg/dL (ref 0.61–1.24)
GFR, Estimated: >60 mL/min (ref >60)
Glucose, Bld: 97 mg/dL (ref 70–99)
Potassium: 4.2 mmol/L (ref 3.5–5.1)
Sodium: 138 mmol/L (ref 135–145)
Total Bilirubin: 0.8 mg/dL (ref 0.0–1.2)
Total Protein: 8.3 g/dL — ABNORMAL HIGH (ref 6.5–8.1)

## 2023-03-25 NOTE — Progress Notes (Signed)
 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 Previous history of provoked DVT in the right lower extremity post-surgery in 2007.  -Continue Eliquis  5mg  twice daily. -Order hypercoagulable workup to assess for risk of clotting. -Plan to repeat ultrasound in a couple of months to assess clot resolution.   Orders Placed This Encounter  Procedures   US  Venous Img Lower Unilateral Left    Standing Status:   Future    Expected Date:   06/23/2023    Expiration Date:   03/24/2024    Reason for Exam (SYMPTOM  OR DIAGNOSIS REQUIRED):   follow up on DVT    Preferred imaging location?:   [facility]   ANTIPHOSPHOLIPID SYNDROME PROF    Standing Status:   Future    Number of Occurrences:   1    Expected Date:   03/25/2023    Expiration Date:   03/24/2024   Antithrombin III     Standing Status:   Future    Number of Occurrences:   1    Expected Date:   03/25/2023    Expiration Date:   03/24/2024   Factor 5 leiden    Standing Status:   Future    Number of Occurrences:   1    Expected Date:   03/25/2023    Expiration Date:   03/24/2024   PNH Profile (-High Sensitivity)    Standing Status:   Future    Number of Occurrences:   1    Expected Date:   03/25/2023    Expiration Date:   03/24/2024   Protein S, total and free    Standing Status:   Future    Number of Occurrences:   1    Expected Date:   03/25/2023    Expiration Date:   03/24/2024   Prothrombin gene mutation    Standing Status:   Future    Number of Occurrences:   1    Expected Date:   03/25/2023    Expiration Date:    03/24/2024   Protein C activity    Standing Status:   Future    Number of Occurrences:   1    Expected Date:   03/25/2023    Expiration Date:   03/24/2024   Beta-2 -glycoprotein i abs, IgG/M/A    Standing Status:   Future    Number of Occurrences:   1    Expected Date:   03/25/2023    Expiration Date:   03/24/2024   Comprehensive metabolic panel    Standing Status:   Future    Number of Occurrences:   1    Expected Date:   03/25/2023    Expiration Date:   03/24/2024   CBC with Differential/Platelet    Standing Status:   Future    Number of Occurrences:   1    Expected Date:   03/25/2023    Expiration Date:   03/24/2024   CMP (Cancer Center only)    Standing Status:   Future    Expected Date:   06/23/2023    Expiration Date:   03/24/2024   CBC  with Differential (Cancer Center Only)    Standing Status:   Future    Expected Date:   06/23/2023    Expiration Date:   03/24/2024   Follow up in 3 months  All questions were answered. The patient knows to call the clinic with any problems, questions or concerns.  Zelphia Cap, MD, PhD [facility] Hematology Oncology 03/25/2023   HISTORY OF PRESENTING ILLNESS:   Sean Garza is a  63 y.o.  male with PMH listed below was seen in consultation at the request of  Delores Orvin BRAVO, NP  for evaluation of lower extremity DVT  He presented with pain in the left lower extremity, initially mistaken for gout. The discomfort was localized to the knee and ankle, preventing weight-bearing. The patient sought medical attention, leading to the discovery of multiple blood clots extending from the back of the knee to the ankle. 02/13/2023 LLE venous ultrasound showed  Nonocclusive thrombus in the left popliteal vein and left posterior tibial vein. Probable occlusive thrombus in the visualized left peroneal vein.   blood clots were unprovoked, unlike the previous episode which was attributed to post-surgical immobility. The patient reported no recent surgeries,  long-distance travel, or other potential triggers.  The patient was prescribed Eliquis , which he tolerated well without any bleeding complications. The medication alleviated some of the pain, but residual discomfort persisted, particularly in the ankle.  This was not the patient's first encounter with blood clots. In 2007, he developed clots in the right leg following Achilles tendon surgery. At that time, he was treated with Lovenox  injections bridging to Coumadin for several months.   The patient also reported chronic ankle pain, likely due to degenerative changes.  The patient's family history was notable for an aunt who died from a blood clot, and a father who died from lung cancer. The patient had no personal history of cancer, and his most recent colonoscopy in 2018 showed some polyps.  MEDICAL HISTORY:  Past Medical History:  Diagnosis Date   Asthma    Collagen vascular disease (HCC)    Rhematoid Arthritis   GERD (gastroesophageal reflux disease)    Gout    Hx of blood clots    Hypertension    Sleep apnea     SURGICAL HISTORY: Past Surgical History:  Procedure Laterality Date   ACHILLES TENDON REPAIR     COLONOSCOPY WITH PROPOFOL  N/A 12/13/2016   Procedure: COLONOSCOPY WITH PROPOFOL ;  Surgeon: Viktoria Lamar DASEN, MD;  Location: [facility] ENDOSCOPY;  Service: Endoscopy;  Laterality: N/A;   EYE SURGERY     floppy eye syndrome surgery   SHOULDER ARTHROSCOPY WITH OPEN ROTATOR CUFF REPAIR Left 03/24/2017   Procedure: SHOULDER ARTHROSCOPY WITH OPEN ROTATOR CUFF REPAIR;  Surgeon: Edie Norleen PARAS, MD;  Location: [facility] ORS;  Service: Orthopedics;  Laterality: Left;   UVULOPALATOPHARYNGOPLASTY      SOCIAL HISTORY: Social History   Socioeconomic History   Marital status: Single    Spouse name: Not on file   Number of children: Not on file   Years of education: Not on file   Highest education level: Not on file  Occupational History   Not on file  Tobacco Use   Smoking status: Never    Smokeless tobacco: Never  Vaping Use   Vaping status: Never Used  Substance and Sexual Activity   Alcohol use: No   Drug use: No   Sexual activity: Not on file  Other Topics Concern   Not on file  Social  History Narrative   Not on file   Social Drivers of Health   Financial Resource Strain: Low Risk  (03/25/2023)   Overall Financial Resource Strain (CARDIA)    Difficulty of Paying Living Expenses: Not hard at all  Food Insecurity: No Food Insecurity (03/25/2023)   Hunger Vital Sign    Worried About Running Out of Food in the Last Year: Never true    Ran Out of Food in the Last Year: Never true  Transportation Needs: No Transportation Needs (03/25/2023)   PRAPARE - Administrator, Civil Service (Medical): No    Lack of Transportation (Non-Medical): No  Physical Activity: Not on file  Stress: Not on file  Social Connections: Unknown (07/28/2021)   Received from [facility]   Social Network    Social Network: Not on file  Intimate Partner Violence: Not At Risk (03/25/2023)   Humiliation, Afraid, Rape, and Kick questionnaire    Fear of Current or Ex-Partner: No    Emotionally Abused: No    Physically Abused: No    Sexually Abused: No    FAMILY HISTORY: Family History  Problem Relation Age of Onset   CAD Father    Lung cancer Father    CAD Brother     ALLERGIES:  is allergic to penicillins.  MEDICATIONS:  Current Outpatient Medications  Medication Sig Dispense Refill   albuterol  (PROVENTIL  HFA;VENTOLIN  HFA) 108 (90 Base) MCG/ACT inhaler Inhale 2 puffs into the lungs every 6 (six) hours as needed for wheezing or shortness of breath.      apixaban  (ELIQUIS ) 5 MG TABS tablet Take 1 tablet (5 mg total) by mouth 2 (two) times daily. 60 tablet 1   Ascorbic Acid  (VITAMIN C) 1000 MG tablet Take 1,000 mg by mouth daily.     budesonide-formoterol  (SYMBICORT) 160-4.5 MCG/ACT inhaler Inhale 2 puffs into the lungs 2 (two) times daily as needed (for wheezing or  shortness of breath.).      esomeprazole (NEXIUM) 20 MG capsule Take by mouth.     gabapentin  (NEURONTIN ) 300 MG capsule Take 1 capsule (300 mg total) by mouth 2 (two) times daily as needed for up to 7 days. 14 capsule 0   hydrochlorothiazide (HYDRODIURIL) 25 MG tablet Take 25 mg by mouth every morning.     ibuprofen (ADVIL) 100 MG/5ML suspension Take 1,000 mg by mouth every 4 (four) hours as needed.     oxyCODONE  (ROXICODONE ) 5 MG immediate release tablet Take 1 tablet (5 mg total) by mouth every 8 (eight) hours as needed. 15 tablet 0   sildenafil  (REVATIO ) 20 MG tablet Take 20 mg by mouth daily. As needed     valsartan (DIOVAN) 160 MG tablet Take 160 mg by mouth daily.     zinc  gluconate 50 MG tablet Take 50 mg by mouth daily.     No current facility-administered medications for this visit.    Review of Systems  Constitutional:  Negative for appetite change, chills, fatigue, fever and unexpected weight change.  HENT:   Negative for hearing loss and voice change.   Eyes:  Negative for eye problems and icterus.  Respiratory:  Negative for chest tightness, cough and shortness of breath.   Cardiovascular:  Positive for leg swelling. Negative for chest pain.  Gastrointestinal:  Negative for abdominal distention and abdominal pain.  Endocrine: Negative for hot flashes.  Genitourinary:  Negative for difficulty urinating, dysuria and frequency.   Musculoskeletal:  Positive for arthralgias.  Skin:  Negative for itching and  rash.  Neurological:  Negative for light-headedness and numbness.  Hematological:  Negative for adenopathy. Does not bruise/bleed easily.  Psychiatric/Behavioral:  Negative for confusion.    PHYSICAL EXAMINATION: ECOG PERFORMANCE STATUS: 1 - Symptomatic but completely ambulatory Vitals:   03/25/23 0936  BP: (!) 173/99  Pulse: 75  Resp: 18  Temp: 97.9 F (36.6 C)  SpO2: 98%   Filed Weights   03/25/23 0936  Weight: (!) 335 lb (152 kg)    Physical  Exam Constitutional:      General: He is not in acute distress.    Appearance: He is obese.  HENT:     Head: Normocephalic and atraumatic.  Eyes:     General: No scleral icterus. Cardiovascular:     Rate and Rhythm: Normal rate and regular rhythm.     Heart sounds: Normal heart sounds.  Pulmonary:     Effort: Pulmonary effort is normal. No respiratory distress.     Breath sounds: No rhonchi.  Abdominal:     General: Bowel sounds are normal. There is no distension.     Palpations: Abdomen is soft.  Musculoskeletal:        General: No deformity. Normal range of motion.     Cervical back: Normal range of motion and neck supple.     Left lower leg: Edema present.  Skin:    General: Skin is warm and dry.     Findings: No erythema or rash.  Neurological:     Mental Status: He is alert and oriented to person, place, and time. Mental status is at baseline.  Psychiatric:        Mood and Affect: Mood normal.     LABORATORY DATA:  I have reviewed the data as listed    Latest Ref Rng & Units 03/25/2023   10:14 AM 05/15/2021    9:15 AM 10/13/2019    1:08 PM  CBC  WBC 4.0 - 10.5 K/uL 6.2  7.7  7.4   Hemoglobin 13.0 - 17.0 g/dL 86.0  85.6  86.4   Hematocrit 39.0 - 52.0 % 42.4  44.3  41.2   Platelets 150 - 400 K/uL 241  271  262       Latest Ref Rng & Units 03/25/2023   10:14 AM 05/15/2021    9:15 AM 10/13/2019    1:08 PM  CMP  Glucose 70 - 99 mg/dL 97  896  98   BUN 8 - 23 mg/dL 18  21  15    Creatinine 0.61 - 1.24 mg/dL 8.96  9.15  9.14   Sodium 135 - 145 mmol/L 138  138  140   Potassium 3.5 - 5.1 mmol/L 4.2  4.1  4.0   Chloride 98 - 111 mmol/L 107  106  108   CO2 22 - 32 mmol/L 24  26  25    Calcium 8.9 - 10.3 mg/dL 9.0  8.7  8.6   Total Protein 6.5 - 8.1 g/dL 8.3  8.5    Total Bilirubin 0.0 - 1.2 mg/dL 0.8  0.7    Alkaline Phos 38 - 126 U/L 65  76    AST 15 - 41 U/L 20  16    ALT 0 - 44 U/L 18  18        RADIOGRAPHIC STUDIES: I have personally reviewed the radiological  images as listed and agreed with the findings in the report. VAS US  LOWER EXTREMITY VENOUS (DVT) Result Date: 03/11/2023  Lower Venous DVT Study Patient Name:  WADIE LITTIE MOON  Date of Exam:   03/08/2023 Medical Rec #: [id number]        Accession #:    [id number] Date of Birth: [birth year]        Patient Gender: M Patient Age:   63 years Exam Location:  [facility] Procedure:      VAS US  LOWER EXTREMITY VENOUS (DVT) Referring Phys: ORVIN DARING --------------------------------------------------------------------------------  Indications: Edema, Swelling, and Pain.  Risk Factors: DVT Left popliteal DVT seen by duplex 03/05/2023. Anticoagulation: Eliquis . Performing Technologist: Donnice Charnley RVT  Examination Guidelines: A complete evaluation includes B-mode imaging, spectral Doppler, color Doppler, and power Doppler as needed of all accessible portions of each vessel. Bilateral testing is considered an integral part of a complete examination. Limited examinations for reoccurring indications may be performed as noted. The reflux portion of the exam is performed with the patient in reverse Trendelenburg.  +-----+---------------+---------+-----------+----------+--------------+ RIGHTCompressibilityPhasicitySpontaneityPropertiesThrombus Aging +-----+---------------+---------+-----------+----------+--------------+ CFV  Full           Yes      Yes                                 +-----+---------------+---------+-----------+----------+--------------+   +---------+---------------+---------+-----------+---------------+--------------+ LEFT     CompressibilityPhasicitySpontaneityProperties     Thrombus Aging +---------+---------------+---------+-----------+---------------+--------------+ CFV      Full           Yes      Yes                                      +---------+---------------+---------+-----------+---------------+--------------+ SFJ      Full           Yes      Yes                                       +---------+---------------+---------+-----------+---------------+--------------+ FV Prox  Full           Yes      Yes                                      +---------+---------------+---------+-----------+---------------+--------------+ FV Mid   Full           Yes      Yes                                      +---------+---------------+---------+-----------+---------------+--------------+ FV DistalPartial        Yes      Yes        retracted      Chronic        +---------+---------------+---------+-----------+---------------+--------------+ POP      Partial        Yes      Yes        partially      Age  re-cannalized  Indeterminate  +---------+---------------+---------+-----------+---------------+--------------+ PTV      Full                                                             +---------+---------------+---------+-----------+---------------+--------------+ Gastroc  Partial                            rigid          Chronic                                                    w/compression                 +---------+---------------+---------+-----------+---------------+--------------+ GSV      Full                                                             +---------+---------------+---------+-----------+---------------+--------------+ SSV      Full                                                             +---------+---------------+---------+-----------+---------------+--------------+ Reflux is seen in the left common femoral and femoral veins.   Summary: RIGHT: - No evidence of common femoral vein obstruction.   LEFT: - Findings consistent with age indeterminate deep vein thrombosis involving the left popliteal vein.  - Findings consistent with chronic deep vein thrombosis involving the left femoral vein.  - There is no evidence of superficial venous  thrombosis.  - No cystic structure found in the popliteal fossa. - Deep venous reflux seen in common femoral and femoral veins.  *See table(s) above for measurements and observations. Electronically signed by Selinda Gu MD on 03/11/2023 at 10:51:00 AM.    Final    US  Venous Img Lower Unilateral Left Result Date: 02/13/2023 CLINICAL DATA:  Left lower extremity pain and edema. History of prior right lower extremity DVT in 2007. EXAM: LEFT LOWER EXTREMITY VENOUS DOPPLER ULTRASOUND TECHNIQUE: Gray-scale sonography with graded compression, as well as color Doppler and duplex ultrasound were performed to evaluate the lower extremity deep venous systems from the level of the common femoral vein and including the common femoral, femoral, profunda femoral, popliteal and calf veins including the posterior tibial, peroneal and gastrocnemius veins when visible. The superficial great saphenous vein was also interrogated. Spectral Doppler was utilized to evaluate flow at rest and with distal augmentation maneuvers in the common femoral, femoral and popliteal veins. COMPARISON:  Prior right lower extremity venous duplex ultrasound studies in 2017 and 2007. FINDINGS: Contralateral Common Femoral Vein: Respiratory phasicity is normal and symmetric with the symptomatic side. No evidence of thrombus. Normal compressibility. Common Femoral Vein: No evidence of thrombus. Normal compressibility, respiratory phasicity and response to augmentation. Saphenofemoral  Junction: No evidence of thrombus. Normal compressibility and flow on color Doppler imaging. Profunda Femoral Vein: No evidence of thrombus. Normal compressibility and flow on color Doppler imaging. Femoral Vein: No evidence of thrombus. Normal compressibility, respiratory phasicity and response to augmentation. Popliteal Vein: Nonocclusive thrombus in the left popliteal vein. Calf Veins: Nonocclusive thrombus in the left posterior tibial vein. Probable occlusive thrombus in the  visualized left peroneal vein. Superficial Great Saphenous Vein: No evidence of thrombus. Normal compressibility. Venous Reflux:  None. Other Findings: No evidence of superficial thrombophlebitis or abnormal fluid collection. IMPRESSION: Nonocclusive thrombus in the left popliteal vein and left posterior tibial vein. Probable occlusive thrombus in the visualized left peroneal vein. Electronically Signed   By: Marcey Moan M.D.   On: 02/13/2023 12:51   DG Ankle Complete Left Result Date: 02/13/2023 CLINICAL DATA:  Left ankle pain.  Unknown injury. EXAM: LEFT ANKLE COMPLETE - 3 VIEW COMPARISON:  None Available. FINDINGS: There are no findings of fracture or dislocation. No joint effusion. Degenerative changes of the ankle. Ankle mortise is intact. Diffuse soft tissue edema of the partially imaged lower leg and surrounding the ankle. IMPRESSION: 1. No acute fracture or dislocation. 2. Degenerative changes of the ankle. 3. Diffuse soft tissue edema of the partially imaged lower leg and surrounding the ankle. Electronically Signed   By: Limin  Xu M.D.   On: 02/13/2023 11:37

## 2023-03-25 NOTE — Assessment & Plan Note (Signed)
 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 Previous history of provoked DVT in the right lower extremity post-surgery in 2007.  -Continue Eliquis  5mg  twice daily. -Order hypercoagulable workup to assess for risk of clotting. -Plan to repeat ultrasound in a couple of months to assess clot resolution.

## 2023-03-27 LAB — PROTEIN C ACTIVITY: Protein C Activity: 97 % (ref 73–180)

## 2023-03-27 LAB — BETA-2-GLYCOPROTEIN I ABS, IGG/M/A
Beta-2 Glyco I IgG: <9 GPI IgG units (ref 0–20)
Beta-2-Glycoprotein I IgA: <9 GPI IgA units (ref 0–25)
Beta-2-Glycoprotein I IgM: <9 GPI IgM units (ref 0–32)

## 2023-03-27 LAB — PROTEIN S, TOTAL AND FREE
Protein S Ag, Free: 108 % (ref 61–136)
Protein S Ag, Total: 84 % (ref 60–150)

## 2023-03-28 LAB — ANTIPHOSPHOLIPID SYNDROME PROF
Anticardiolipin IgG: <9 [GPL'U]/mL (ref 0–14)
Anticardiolipin IgM: 14 [MPL'U]/mL — ABNORMAL HIGH (ref 0–12)
DRVVT: 61.8 s — ABNORMAL HIGH (ref 0.0–47.0)
PTT Lupus Anticoagulant: 44.3 s — ABNORMAL HIGH (ref 0.0–43.5)

## 2023-03-28 LAB — DRVVT MIX: dRVVT Mix: 45.6 s — ABNORMAL HIGH (ref 0.0–40.4)

## 2023-03-28 LAB — PTT-LA MIX: PTT-LA Mix: 40.8 s — ABNORMAL HIGH (ref 0.0–40.5)

## 2023-03-28 LAB — DRVVT CONFIRM: dRVVT Confirm: 1 {ratio} (ref 0.8–1.2)

## 2023-03-28 LAB — HEXAGONAL PHASE PHOSPHOLIPID: Hexagonal Phase Phospholipid: 7 s (ref 0–11)

## 2023-03-30 ENCOUNTER — Ambulatory Visit: Payer: BC Managed Care – PPO | Admitting: Podiatry

## 2023-03-30 ENCOUNTER — Encounter: Payer: Self-pay | Admitting: Podiatry

## 2023-03-30 ENCOUNTER — Ambulatory Visit (INDEPENDENT_AMBULATORY_CARE_PROVIDER_SITE_OTHER): Payer: BC Managed Care – PPO

## 2023-03-30 VITALS — Ht 68.0 in | Wt 335.0 lb

## 2023-03-30 DIAGNOSIS — M79672 Pain in left foot: Secondary | ICD-10-CM

## 2023-03-30 DIAGNOSIS — M25572 Pain in left ankle and joints of left foot: Secondary | ICD-10-CM | POA: Diagnosis not present

## 2023-03-30 DIAGNOSIS — M19072 Primary osteoarthritis, left ankle and foot: Secondary | ICD-10-CM | POA: Diagnosis not present

## 2023-03-30 LAB — FACTOR 5 LEIDEN

## 2023-03-30 NOTE — Progress Notes (Signed)
 Subjective:  Patient ID: Sean Garza, male    DOB: [birth year],  MRN: [id number]  Chief Complaint  Patient presents with   Foot Pain    Pt is here due to left foot pain, pt states the pain has been there so for so long, he can't remember not being in pain states the pain goes from the top of his foot up his leg, wants some relief because he is tried of being in pain all the time.    Discussed the use of AI scribe software for clinical note transcription with the patient, who gave verbal consent to proceed.  History of Present Illness   The patient, with a history of deep vein thrombosis (DVT) and previous Achilles surgery, presents with chronic ankle pain. The pain is localized across the front of the ankle, radiating down and around the foot, and occasionally up the shin. The patient denies any significant injuries or fractures but has been dealing with this issue for a considerable time.  The patient's DVT history began in 2007, initially in the left leg, and has since developed in the right leg from the knee down. The patient is currently under the care of a hematologist at a cancer center, where investigations are ongoing to determine the cause of the recurrent blood clots.  The patient has been managing the ankle pain with naproxen, but due to the ongoing hematological issues, the hematologist recommended switching to Tylenol , which the patient reports as less effective. The patient has previously had steroid injections in the knee and shoulder with variable results.  The patient also has a history of using a non-molded brace for the affected foot during softball games. The patient has expressed a desire to return to regular walking activities, which have been hindered by the current condition. The patient has also reported a recent weight loss of 16 pounds, which he attributes to a lack of appetite.          Objective:    Physical Exam   MEASUREMENTS: WT- lost sixteen  pounds EXTREMITIES: Moderate edema throughout the bilateral lower extremities. Skin with hemosiderosis and varicosities noted. MUSCULOSKELETAL: Pain on palpation to the left anterior joint line of the ankle and with range of motion palpation of the sinus, tarsus, and subtalar joint.       No images are attached to the encounter.    Results   Procedure: Corticosteroid Injection Description: The left ankle and subtalar joint were injected separately through separate approaches in the medial gutter for the ankle and sinus tarsus on the left subtalar joint with 20 mg of Kenalog, 4 mg of dexamethasone , and 0.5 cc of 0.5% Marcaine  plain. He tolerated this well. Informed Consent: Discussion included corticosteroid injection therapy as well as bracing, and surgically treating with tibiotalar calcaneal arthrodesis with a hindfoot nail. Risks, benefits, and alternatives were discussed.  RADIOLOGY Left ankle radiographs: Significant osteoarthrosis and degenerative changes in the posterior subtalar joint, medial and lateral ankle gutters, and partial syndesmotic osseous fusion. (02/13/2023) Left foot radiographs: Significant osteoarthrosis and degenerative changes in the posterior subtalar joint, medial and lateral ankle gutters, and partial syndesmotic osseous fusion. (03/30/2023)      Assessment:   1. 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  Assessment and Plan    Ankle and Subtalar Joint Arthritis   Significant osteoarthrosis and degenerative changes are  present in the posterior subtalar joint, medial and lateral ankle gutters, with partial syndesmotic osseous fusion. There is pain on palpation and with range of motion. Operative and nonoperative treatments were discussed, including corticosteroid injection therapy, bracing, and surgical intervention (tibiotalar calcaneal arthrodesis  with a hind foot nail). He prefers nonoperative treatment. Administered corticosteroid injection in the left ankle and subtalar joint separately with 20mg  of Kenalog, 4mg  of dexamethasone , and 0.5cc of 0.5% Marcaine  plain. Prescribed an [place]  AFO from [facility] for bracing.  Deep Vein Thrombosis (DVT) History   DVT has been present in the left leg since 2007 and is now also in the right leg. A hematologist is currently investigating the cause of recurrent blood clots. Continue hematologist follow-up for ongoing management of DVT.          No follow-ups on file.

## 2023-03-30 NOTE — Patient Instructions (Addendum)
 Sean Garza

## 2023-03-31 LAB — PROTHROMBIN GENE MUTATION

## 2023-04-01 LAB — PNH PROFILE (-HIGH SENSITIVITY)

## 2023-04-08 DIAGNOSIS — E785 Hyperlipidemia, unspecified: Secondary | ICD-10-CM | POA: Diagnosis not present

## 2023-04-08 DIAGNOSIS — R7309 Other abnormal glucose: Secondary | ICD-10-CM | POA: Diagnosis not present

## 2023-04-08 DIAGNOSIS — I10 Essential (primary) hypertension: Secondary | ICD-10-CM | POA: Diagnosis not present

## 2023-04-08 DIAGNOSIS — Z125 Encounter for screening for malignant neoplasm of prostate: Secondary | ICD-10-CM | POA: Diagnosis not present

## 2023-04-13 DIAGNOSIS — E785 Hyperlipidemia, unspecified: Secondary | ICD-10-CM | POA: Diagnosis not present

## 2023-04-13 DIAGNOSIS — R7309 Other abnormal glucose: Secondary | ICD-10-CM | POA: Diagnosis not present

## 2023-04-13 DIAGNOSIS — I10 Essential (primary) hypertension: Secondary | ICD-10-CM | POA: Diagnosis not present

## 2023-04-25 ENCOUNTER — Other Ambulatory Visit: Payer: Self-pay

## 2023-04-25 DIAGNOSIS — I824Y2 Acute embolism and thrombosis of unspecified deep veins of left proximal lower extremity: Secondary | ICD-10-CM

## 2023-04-26 LAB — MISC LABCORP TEST (SEND OUT): Labcorp test code: 15040

## 2023-06-07 DIAGNOSIS — M19072 Primary osteoarthritis, left ankle and foot: Secondary | ICD-10-CM | POA: Diagnosis not present

## 2023-06-23 ENCOUNTER — Ambulatory Visit
Admission: RE | Admit: 2023-06-23 | Discharge: 2023-06-23 | Disposition: A | Discharge status: Home / Self Care | Payer: BC Managed Care – PPO | Source: Ambulatory Visit | Attending: Oncology | Admitting: Oncology

## 2023-06-23 DIAGNOSIS — I824Y2 Acute embolism and thrombosis of unspecified deep veins of left proximal lower extremity: Secondary | ICD-10-CM | POA: Insufficient documentation

## 2023-06-23 DIAGNOSIS — I82432 Acute embolism and thrombosis of left popliteal vein: Secondary | ICD-10-CM | POA: Diagnosis not present

## 2023-06-29 ENCOUNTER — Ambulatory Visit (INDEPENDENT_AMBULATORY_CARE_PROVIDER_SITE_OTHER): Payer: BC Managed Care – PPO | Admitting: Podiatry

## 2023-06-29 DIAGNOSIS — Z91199 Patient's noncompliance with other medical treatment and regimen due to unspecified reason: Secondary | ICD-10-CM

## 2023-06-30 NOTE — Progress Notes (Signed)
 Patient was no-show for appointment today

## 2023-07-01 ENCOUNTER — Inpatient Hospital Stay: Payer: BC Managed Care – PPO | Admitting: Oncology

## 2023-07-01 ENCOUNTER — Inpatient Hospital Stay: Payer: BC Managed Care – PPO | Attending: Oncology

## 2023-07-01 ENCOUNTER — Encounter: Payer: Self-pay | Admitting: Oncology

## 2023-07-01 VITALS — BP 134/88 | HR 72 | Temp 98.1°F | Resp 18 | Wt 327.0 lb

## 2023-07-01 DIAGNOSIS — Z86718 Personal history of other venous thrombosis and embolism: Secondary | ICD-10-CM | POA: Insufficient documentation

## 2023-07-01 DIAGNOSIS — Z79899 Other long term (current) drug therapy: Secondary | ICD-10-CM | POA: Insufficient documentation

## 2023-07-01 DIAGNOSIS — I824Y2 Acute embolism and thrombosis of unspecified deep veins of left proximal lower extremity: Secondary | ICD-10-CM | POA: Insufficient documentation

## 2023-07-01 DIAGNOSIS — Z8249 Family history of ischemic heart disease and other diseases of the circulatory system: Secondary | ICD-10-CM | POA: Insufficient documentation

## 2023-07-01 DIAGNOSIS — Z880 Allergy status to penicillin: Secondary | ICD-10-CM | POA: Diagnosis not present

## 2023-07-01 DIAGNOSIS — Z801 Family history of malignant neoplasm of trachea, bronchus and lung: Secondary | ICD-10-CM | POA: Insufficient documentation

## 2023-07-01 DIAGNOSIS — M25579 Pain in unspecified ankle and joints of unspecified foot: Secondary | ICD-10-CM | POA: Diagnosis not present

## 2023-07-01 DIAGNOSIS — Z7901 Long term (current) use of anticoagulants: Secondary | ICD-10-CM | POA: Diagnosis not present

## 2023-07-01 LAB — CMP (CANCER CENTER ONLY)
ALT: 18 U/L (ref 0–44)
AST: 18 U/L (ref 15–41)
Albumin: 3.6 g/dL (ref 3.5–5.0)
Alkaline Phosphatase: 73 U/L (ref 38–126)
Anion gap: 8 (ref 5–15)
BUN: 18 mg/dL (ref 8–23)
CO2: 26 mmol/L (ref 22–32)
Calcium: 9 mg/dL (ref 8.9–10.3)
Chloride: 107 mmol/L (ref 98–111)
Creatinine: 1.06 mg/dL (ref 0.61–1.24)
GFR, Estimated: >60 mL/min (ref >60)
Glucose, Bld: 92 mg/dL (ref 70–99)
Potassium: 4.7 mmol/L (ref 3.5–5.1)
Sodium: 141 mmol/L (ref 135–145)
Total Bilirubin: 1 mg/dL (ref 0.0–1.2)
Total Protein: 8.1 g/dL (ref 6.5–8.1)

## 2023-07-01 LAB — CBC WITH DIFFERENTIAL (CANCER CENTER ONLY)
Abs Immature Granulocytes: 0.03 10*3/uL (ref 0.00–0.07)
Basophils Absolute: 0.1 10*3/uL (ref 0.0–0.1)
Basophils Relative: 1 %
Eosinophils Absolute: 0.2 10*3/uL (ref 0.0–0.5)
Eosinophils Relative: 2 %
HCT: 44.3 % (ref 39.0–52.0)
Hemoglobin: 14.5 g/dL (ref 13.0–17.0)
Immature Granulocytes: 0 %
Lymphocytes Relative: 22 %
Lymphs Abs: 1.5 10*3/uL (ref 0.7–4.0)
MCH: 29.2 pg (ref 26.0–34.0)
MCHC: 32.7 g/dL (ref 30.0–36.0)
MCV: 89.1 fL (ref 80.0–100.0)
Monocytes Absolute: 0.6 10*3/uL (ref 0.1–1.0)
Monocytes Relative: 8 %
Neutro Abs: 4.6 10*3/uL (ref 1.7–7.7)
Neutrophils Relative %: 67 %
Platelet Count: 257 10*3/uL (ref 150–400)
RBC: 4.97 MIL/uL (ref 4.22–5.81)
RDW: 15.1 % (ref 11.5–15.5)
WBC Count: 6.9 10*3/uL (ref 4.0–10.5)
nRBC: 0 % (ref 0.0–0.2)

## 2023-07-01 MED ORDER — APIXABAN 2.5 MG PO TABS: 2.5000 mg | ORAL_TABLET | Freq: Two times a day (BID) | ORAL | 6 refills | Status: DC

## 2023-07-01 NOTE — Progress Notes (Signed)
 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 Previous history of provoked DVT in the right lower extremity post-surgery in 2007.  Hypercoagulable workup showed negative factor V Leiden mutation, negative prothrombin gene mutation, normal protein C and S, no lupus anticoagulant.  Negative beta glycoprotein antibodies, low anticardiolipin IgM, less than 40, not clinically significant. Repeat ultrasound showed resolved DVT Recommend patient to decrease to Eliquis  2.5mg  twice daily.    Orders Placed This Encounter  Procedures   CMP (Cancer Center only)    Standing Status:   Future    Expected Date:   12/31/2023    Expiration Date:   06/30/2024   CBC with Differential (Cancer Center Only)    Standing Status:   Future    Expected Date:   12/31/2023    Expiration Date:   06/30/2024   Follow up in 6 months  All questions were answered. The patient knows to call the clinic with any problems, questions or concerns.  Timmy Forbes, MD, PhD [facility] Hematology Oncology 07/01/2023   HISTORY OF PRESENTING ILLNESS:   Sean Garza is a  63 y.o.  male with PMH listed below was seen in consultation at the request of  Westley Hammers, MD  for evaluation of lower extremity DVT  He presented with pain in the left lower extremity, initially mistaken for gout. The discomfort was localized to the knee and ankle, preventing weight-bearing. The patient sought medical attention, leading to the discovery of multiple blood clots extending from the back of the knee to the ankle. 02/13/2023 LLE venous  ultrasound showed  Nonocclusive thrombus in the left popliteal vein and left posterior tibial vein. Probable occlusive thrombus in the visualized left peroneal vein.   blood clots were unprovoked, unlike the previous episode which was attributed to post-surgical immobility. The patient reported no recent surgeries, long-distance travel, or other potential triggers.  The patient was prescribed Eliquis , which he tolerated well without any bleeding complications. The medication alleviated some of the pain, but residual discomfort persisted, particularly in the ankle.  This was not the patient's first encounter with blood clots. In 2007, he developed clots in the right leg following Achilles tendon surgery. At that time, he was treated with Lovenox  injections bridging to Coumadin for several months.   The patient also reported chronic ankle pain, likely due to degenerative changes.  The patient's family history was notable for an aunt who died from a blood clot, and a father who died from lung cancer. The patient had no personal history of cancer, and his most recent colonoscopy in 2018 showed some polyps.  MEDICAL HISTORY:  Past Medical History:  Diagnosis Date   Asthma    Collagen vascular disease (HCC)    Rhematoid Arthritis   GERD (gastroesophageal reflux disease)    Gout    Hx of blood clots    Hypertension    Sleep apnea     SURGICAL HISTORY: Past Surgical History:  Procedure Laterality Date   ACHILLES TENDON REPAIR     COLONOSCOPY WITH PROPOFOL  N/A 12/13/2016   Procedure: COLONOSCOPY WITH PROPOFOL ;  Surgeon: Felicita Horns,  Jerrol Morelle, MD;  Location: [facility] ENDOSCOPY;  Service: Endoscopy;  Laterality: N/A;   EYE SURGERY     floppy eye syndrome surgery   SHOULDER ARTHROSCOPY WITH OPEN ROTATOR CUFF REPAIR Left 03/24/2017   Procedure: SHOULDER ARTHROSCOPY WITH OPEN ROTATOR CUFF REPAIR;  Surgeon: Elner Hahn, MD;  Location: [facility] ORS;  Service: Orthopedics;  Laterality: Left;    UVULOPALATOPHARYNGOPLASTY      SOCIAL HISTORY: Social History   Socioeconomic History   Marital status: Single    Spouse name: Not on file   Number of children: Not on file   Years of education: Not on file   Highest education level: Not on file  Occupational History   Not on file  Tobacco Use   Smoking status: Never   Smokeless tobacco: Never  Vaping Use   Vaping status: Never Used  Substance and Sexual Activity   Alcohol use: No   Drug use: No   Sexual activity: Not on file  Other Topics Concern   Not on file  Social History Narrative   Not on file   Social Drivers of Health   Financial Resource Strain: Low Risk  (03/25/2023)   Overall Financial Resource Strain (CARDIA)    Difficulty of Paying Living Expenses: Not hard at all  Food Insecurity: No Food Insecurity (03/25/2023)   Hunger Vital Sign    Worried About Running Out of Food in the Last Year: Never true    Ran Out of Food in the Last Year: Never true  Transportation Needs: No Transportation Needs (03/25/2023)   PRAPARE - Administrator, Civil Service (Medical): No    Lack of Transportation (Non-Medical): No  Physical Activity: Not on file  Stress: Not on file  Social Connections: Unknown (07/28/2021)   Received from [facility]   Social Network    Social Network: Not on file  Intimate Partner Violence: Not At Risk (03/25/2023)   Humiliation, Afraid, Rape, and Kick questionnaire    Fear of Current or Ex-Partner: No    Emotionally Abused: No    Physically Abused: No    Sexually Abused: No    FAMILY HISTORY: Family History  Problem Relation Age of Onset   CAD Father    Lung cancer Father    CAD Brother     ALLERGIES:  is allergic to penicillins.  MEDICATIONS:  Current Outpatient Medications  Medication Sig Dispense Refill   albuterol  (PROVENTIL  HFA;VENTOLIN  HFA) 108 (90 Base) MCG/ACT inhaler Inhale 2 puffs into the lungs every 6 (six) hours as needed for wheezing or shortness of breath.       Ascorbic Acid  (VITAMIN C) 1000 MG tablet Take 1,000 mg by mouth daily.     budesonide-formoterol  (SYMBICORT) 160-4.5 MCG/ACT inhaler Inhale 2 puffs into the lungs 2 (two) times daily as needed (for wheezing or shortness of breath.).      esomeprazole (NEXIUM) 20 MG capsule Take by mouth.     hydrochlorothiazide (HYDRODIURIL) 25 MG tablet Take 25 mg by mouth every morning.     ibuprofen (ADVIL) 100 MG/5ML suspension Take 1,000 mg by mouth every 4 (four) hours as needed.     sildenafil (REVATIO) 20 MG tablet Take 20 mg by mouth daily. As needed     valsartan (DIOVAN) 160 MG tablet Take 160 mg by mouth daily.     zinc  gluconate 50 MG tablet Take 50 mg by mouth daily.     apixaban  (ELIQUIS ) 2.5 MG TABS tablet Take 1 tablet (2.5 mg  total) by mouth 2 (two) times daily. 60 tablet 6   gabapentin  (NEURONTIN ) 300 MG capsule Take 1 capsule (300 mg total) by mouth 2 (two) times daily as needed for up to 7 days. (Patient not taking: Reported on 07/01/2023) 14 capsule 0   oxyCODONE  (ROXICODONE ) 5 MG immediate release tablet Take 1 tablet (5 mg total) by mouth every 8 (eight) hours as needed. (Patient not taking: Reported on 07/01/2023) 15 tablet 0   No current facility-administered medications for this visit.    Review of Systems  Constitutional:  Negative for appetite change, chills, fatigue, fever and unexpected weight change.  HENT:   Negative for hearing loss and voice change.   Eyes:  Negative for eye problems and icterus.  Respiratory:  Negative for chest tightness, cough and shortness of breath.   Cardiovascular:  Negative for chest pain and leg swelling.  Gastrointestinal:  Negative for abdominal distention and abdominal pain.  Endocrine: Negative for hot flashes.  Genitourinary:  Negative for difficulty urinating, dysuria and frequency.   Musculoskeletal:  Positive for arthralgias.  Skin:  Negative for itching and rash.  Neurological:  Negative for light-headedness and numbness.   Hematological:  Negative for adenopathy. Does not bruise/bleed easily.  Psychiatric/Behavioral:  Negative for confusion.    PHYSICAL EXAMINATION: ECOG PERFORMANCE STATUS: 1 - Symptomatic but completely ambulatory Vitals:   07/01/23 1131  BP: 134/88  Pulse: 72  Resp: 18  Temp: 98.1 F (36.7 C)   Filed Weights   07/01/23 1131  Weight: (!) 327 lb (148.3 kg)    Physical Exam Constitutional:      General: He is not in acute distress.    Appearance: He is obese.  HENT:     Head: Normocephalic and atraumatic.  Eyes:     General: No scleral icterus. Cardiovascular:     Rate and Rhythm: Normal rate and regular rhythm.  Pulmonary:     Effort: Pulmonary effort is normal. No respiratory distress.  Abdominal:     General: There is no distension.  Musculoskeletal:        General: No deformity. Normal range of motion.     Cervical back: Normal range of motion and neck supple.  Skin:    General: Skin is warm and dry.     Findings: No erythema or rash.  Neurological:     Mental Status: He is alert and oriented to person, place, and time. Mental status is at baseline.  Psychiatric:        Mood and Affect: Mood normal.     LABORATORY DATA:  I have reviewed the data as listed    Latest Ref Rng & Units 07/01/2023   11:08 AM 03/25/2023   10:14 AM 05/15/2021    9:15 AM  CBC  WBC 4.0 - 10.5 K/uL 6.9  6.2  7.7   Hemoglobin 13.0 - 17.0 g/dL 16.1  09.6  04.5   Hematocrit 39.0 - 52.0 % 44.3  42.4  44.3   Platelets 150 - 400 K/uL 257  241  271       Latest Ref Rng & Units 07/01/2023   11:08 AM 03/25/2023   10:14 AM 05/15/2021    9:15 AM  CMP  Glucose 70 - 99 mg/dL 92  97  409   BUN 8 - 23 mg/dL 18  18  21    Creatinine 0.61 - 1.24 mg/dL 8.11  9.14  7.82   Sodium 135 - 145 mmol/L 141  138  138   Potassium  3.5 - 5.1 mmol/L 4.7  4.2  4.1   Chloride 98 - 111 mmol/L 107  107  106   CO2 22 - 32 mmol/L 26  24  26    Calcium 8.9 - 10.3 mg/dL 9.0  9.0  8.7   Total Protein 6.5 - 8.1 g/dL 8.1   8.3  8.5   Total Bilirubin 0.0 - 1.2 mg/dL 1.0  0.8  0.7   Alkaline Phos 38 - 126 U/L 73  65  76   AST 15 - 41 U/L 18  20  16    ALT 0 - 44 U/L 18  18  18        RADIOGRAPHIC STUDIES: I have personally reviewed the radiological images as listed and agreed with the findings in the report. US  Venous Img Lower Unilateral Left Result Date: 06/23/2023 CLINICAL DATA:  Follow-up DVT EXAM: LEFT LOWER EXTREMITY VENOUS DOPPLER ULTRASOUND TECHNIQUE: Gray-scale sonography with compression, as well as color and duplex ultrasound, were performed to evaluate the deep venous system(s) from the level of the common femoral vein through the popliteal and proximal calf veins. COMPARISON:  Venous duplex ultrasound performed February 13, 2023 FINDINGS: VENOUS Normal compressibility of the common femoral, superficial femoral, and popliteal veins, as well as the visualized calf veins. Visualized portions of profunda femoral vein and great saphenous vein unremarkable. No filling defects to suggest DVT on grayscale or color Doppler imaging. Doppler waveforms show normal direction of venous flow, normal respiratory plasticity and response to augmentation. Limited views of the contralateral common femoral vein are unremarkable. OTHER The previously observed popliteal venous thrombosis is not seen on the current exam. Evaluation of the tibial veins is somewhat limited when compared to the prior exam, however, there is no convincing ultrasound evidence of residual deep venous thrombosis. Limitations: none IMPRESSION: 1. There is no ultrasound evidence of residual deep venous thrombosis. The previously observed popliteal DVT has resolved. Electronically Signed   By: Reagan Camera M.D.   On: 06/23/2023 11:23

## 2023-07-01 NOTE — Assessment & Plan Note (Addendum)
 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 Previous history of provoked DVT in the right lower extremity post-surgery in 2007.  Hypercoagulable workup showed negative factor V Leiden mutation, negative prothrombin gene mutation, normal protein C and S, no lupus anticoagulant.  Negative beta glycoprotein antibodies, low anticardiolipin IgM, less than 40, not clinically significant. Repeat ultrasound showed resolved DVT Recommend patient to decrease to Eliquis  2.5mg  twice daily.

## 2023-08-02 ENCOUNTER — Encounter (INDEPENDENT_AMBULATORY_CARE_PROVIDER_SITE_OTHER): Payer: Self-pay

## 2023-08-12 ENCOUNTER — Encounter: Payer: Self-pay | Admitting: Internal Medicine

## 2023-08-12 ENCOUNTER — Other Ambulatory Visit: Payer: Self-pay

## 2023-08-12 ENCOUNTER — Ambulatory Visit: Admitting: Internal Medicine

## 2023-08-12 VITALS — BP 120/82 | HR 82 | Temp 97.9°F | Resp 16 | Ht 67.0 in | Wt 333.6 lb

## 2023-08-12 DIAGNOSIS — J4520 Mild intermittent asthma, uncomplicated: Secondary | ICD-10-CM

## 2023-08-12 DIAGNOSIS — G4733 Obstructive sleep apnea (adult) (pediatric): Secondary | ICD-10-CM | POA: Diagnosis not present

## 2023-08-12 DIAGNOSIS — E559 Vitamin D deficiency, unspecified: Secondary | ICD-10-CM

## 2023-08-12 DIAGNOSIS — Z13220 Encounter for screening for lipoid disorders: Secondary | ICD-10-CM

## 2023-08-12 DIAGNOSIS — E66813 Obesity, class 3: Secondary | ICD-10-CM

## 2023-08-12 DIAGNOSIS — Z23 Encounter for immunization: Secondary | ICD-10-CM | POA: Diagnosis not present

## 2023-08-12 DIAGNOSIS — R5383 Other fatigue: Secondary | ICD-10-CM | POA: Diagnosis not present

## 2023-08-12 DIAGNOSIS — R7309 Other abnormal glucose: Secondary | ICD-10-CM | POA: Diagnosis not present

## 2023-08-12 DIAGNOSIS — Z86718 Personal history of other venous thrombosis and embolism: Secondary | ICD-10-CM

## 2023-08-12 DIAGNOSIS — I10 Essential (primary) hypertension: Secondary | ICD-10-CM

## 2023-08-12 DIAGNOSIS — M1990 Unspecified osteoarthritis, unspecified site: Secondary | ICD-10-CM

## 2023-08-12 DIAGNOSIS — K219 Gastro-esophageal reflux disease without esophagitis: Secondary | ICD-10-CM

## 2023-08-12 MED ORDER — ALBUTEROL SULFATE HFA 108 (90 BASE) MCG/ACT IN AERS: 2.0000 | INHALATION_SPRAY | Freq: Four times a day (QID) | RESPIRATORY_TRACT | 1 refills | Status: DC | PRN

## 2023-08-12 MED ORDER — PANTOPRAZOLE SODIUM 20 MG PO TBEC
20.0000 mg | DELAYED_RELEASE_TABLET | Freq: Every day | ORAL | 0 refills | Status: DC | PRN
Start: 2023-08-12 — End: 2024-01-31

## 2023-08-12 MED ORDER — BREZTRI AEROSPHERE 160-9-4.8 MCG/ACT IN AERO: 2.0000 | INHALATION_SPRAY | Freq: Two times a day (BID) | RESPIRATORY_TRACT | 11 refills | Status: DC

## 2023-08-12 NOTE — Progress Notes (Signed)
 New Patient Office Visit  Subjective    Patient ID: Sean Garza, male    DOB: 07/31/1960  Age: 63 y.o. MRN: [id number]  CC:  Chief Complaint  Patient presents with   Establish Care    HPI [name] presents to establish care. He is here with his daughter.   Discussed the use of AI scribe software for clinical note transcription with the patient, who gave verbal consent to proceed.  History of Present Illness [name] is a 63 year old male with hypertension, history of blood clots, asthma, and sleep apnea who presents for establishment of care.  He manages hypertension with hydrochlorothiazide 25 mg daily but has not been taking valsartan 160 mg. He monitors blood pressure at home.  He has a history of DVT with previous blood clots from groin to knee and knee to ankle. He is on Eliquis  2.5 mg twice daily and has regular follow-ups. No significant bleeding has occurred.  Asthma is managed with a Symbicort inhaler daily and Ventolin  as needed. His condition worsened post-COVID-19 hospitalization, where he required oxygen.  Sleep apnea was confirmed by a sleep study. He does not use CPAP due to intolerance and has undergone nasal surgery. He experiences poor sleep quality and daytime sleepiness.  He experiences acid reflux and uses over-the-counter Nexium as needed. Rheumatoid arthritis causes daily joint pain, managed with naproxen despite bleeding risk from Eliquis . Tylenol  is ineffective.  He is retired but works at a [employer] and is attempting dietary changes for weight loss.   Hypertension/OSA: -Medications: hydrochlorothiazide 25 mg, Valsartan 160 mg (but not taking valsartan) -Denies any SOB, CP, vision changes, LE edema or symptoms of hypotension -Diagnosed with severe OSA in the past, underwent oral surgery but symptoms are still present and cannot tolerate CPAP.  Hx of DVT: -Hx of original provoked DVT in RLE, then unprovoked in LLE -Currently on  Eliquis  2.5 mg BID  -Following with Hematology   Asthma:  -Asthma status: uncontrolled -Current Treatments: Symbicort, Albuterol  PRN -Satisfied with current treatment?: no -Visits to ER or Urgent Care in past year: no -Pneumovax: Not up to Date -Influenza: unknown -Hx of ICU admission for COVID-19 PNA in 2021 (refuses COVID vaccines)  GERD: -Currently on Nexium 20 mg OTC PRN  Health Maintenance: -Blood work due -Prevnar 20 due    Outpatient Encounter Medications as of 08/12/2023  Medication Sig   albuterol  (PROVENTIL  HFA;VENTOLIN  HFA) 108 (90 Base) MCG/ACT inhaler Inhale 2 puffs into the lungs every 6 (six) hours as needed for wheezing or shortness of breath.    apixaban  (ELIQUIS ) 2.5 MG TABS tablet Take 1 tablet (2.5 mg total) by mouth 2 (two) times daily.   Ascorbic Acid  (VITAMIN C) 1000 MG tablet Take 1,000 mg by mouth daily.   budesonide-formoterol  (SYMBICORT) 160-4.5 MCG/ACT inhaler Inhale 2 puffs into the lungs 2 (two) times daily as needed (for wheezing or shortness of breath.).    esomeprazole (NEXIUM) 20 MG capsule Take by mouth.   hydrochlorothiazide (HYDRODIURIL) 25 MG tablet Take 25 mg by mouth every morning.   naproxen sodium (ALEVE) 220 MG tablet Take 220 mg by mouth.   sildenafil (REVATIO) 20 MG tablet Take 20 mg by mouth daily. As needed   valsartan (DIOVAN) 160 MG tablet Take 160 mg by mouth daily.   zinc  gluconate 50 MG tablet Take 50 mg by mouth daily.   gabapentin  (NEURONTIN ) 300 MG capsule Take 1 capsule (300 mg total) by mouth 2 (two) times  daily as needed for up to 7 days. (Patient not taking: Reported on 08/12/2023)   ibuprofen (ADVIL) 100 MG/5ML suspension Take 1,000 mg by mouth every 4 (four) hours as needed. (Patient not taking: Reported on 08/12/2023)   oxyCODONE  (ROXICODONE ) 5 MG immediate release tablet Take 1 tablet (5 mg total) by mouth every 8 (eight) hours as needed. (Patient not taking: Reported on 08/12/2023)   No facility-administered encounter  medications on file as of 08/12/2023.    Past Medical History:  Diagnosis Date   Asthma    Collagen vascular disease (HCC)    Rhematoid Arthritis   GERD (gastroesophageal reflux disease)    Gout    Hx of blood clots    Hypertension    Sleep apnea     Past Surgical History:  Procedure Laterality Date   ACHILLES TENDON REPAIR     COLONOSCOPY WITH PROPOFOL  N/A 12/13/2016   Procedure: COLONOSCOPY WITH PROPOFOL ;  Surgeon: Cassie Click, MD;  Location: [facility] END[facility]COPY;  Service: Endoscopy;  Laterality: N/A;   EYE SURGERY     floppy eye syndrome surgery   SHOULDER ARTHROSCOPY WITH OPEN ROTATOR CUFF REPAIR Left 03/24/2017   Procedure: SHOULDER ARTHROSCOPY WITH OPEN ROTATOR CUFF REPAIR;  Surgeon: Elner Hahn, MD;  Location: [facility] ORS;  Service: Orthopedics;  Laterality: Left;   UVULOPALATOPHARYNGOPLASTY      Family History  Problem Relation Age of Onset   CAD Father    Lung cancer Father    CAD Brother     Social History   Socioeconomic History   Marital status: Single    Spouse name: Not on file   Number of children: Not on file   Years of education: Not on file   Highest education level: Not on file  Occupational History   Not on file  Tobacco Use   Smoking status: Never    Passive exposure: Never   Smokeless tobacco: Never  Vaping Use   Vaping status: Never Used  Substance and Sexual Activity   Alcohol use: No   Drug use: No   Sexual activity: Not Currently  Other Topics Concern   Not on file  Social History Narrative   Not on file   Social Drivers of Health   Financial Resource Strain: Low Risk  (03/25/2023)   Overall Financial Resource Strain (CARDIA)    Difficulty of Paying Living Expenses: Not hard at all  Food Insecurity: No Food Insecurity (03/25/2023)   Hunger Vital Sign    Worried About Running Out of Food in the Last Year: Never true    Ran Out of Food in the Last Year: Never true  Transportation Needs: No Transportation Needs (03/25/2023)    PRAPARE - Administrator, Civil Service (Medical): No    Lack of Transportation (Non-Medical): No  Physical Activity: Not on file  Stress: Not on file  Social Connections: Unknown (07/28/2021)   Received [facility]   Social Network    Social Network: Not on file  Intimate Partner Violence: Not At Risk (03/25/2023)   Humiliation, Afraid, Rape, and Kick questionnaire    Fear of Current or Ex-Partner: No    Emotionally Abused: No    Physically Abused: No    Sexually Abused: No    Review of Systems  Constitutional:  Positive for malaise/fatigue.  Respiratory:  Positive for shortness of breath and wheezing. Negative for cough.   Cardiovascular:  Negative for chest pain.  Musculoskeletal:  Positive for joint pain.  Objective    BP 120/82 (Cuff Size: Large)   Pulse 82   Temp 97.9 F (36.6 C) (Oral)   Resp 16   Ht 5\' 7"  (1.702 m)   Wt (!) 333 lb 9.6 oz (151.3 kg)   SpO2 96%   BMI 52.25 kg/m   Physical Exam Constitutional:      Appearance: Normal appearance. He is obese.  HENT:     Head: Normocephalic and atraumatic.     Mouth/Throat:     Mouth: Mucous membranes are moist.     Pharynx: Oropharynx is clear.  Eyes:     Extraocular Movements: Extraocular movements intact.     Conjunctiva/sclera: Conjunctivae normal.     Pupils: Pupils are equal, round, and reactive to light.  Neck:     Comments: No thyromegaly  Cardiovascular:     Rate and Rhythm: Normal rate and regular rhythm.  Pulmonary:     Effort: Pulmonary effort is normal.     Breath sounds: Normal breath sounds.  Musculoskeletal:     Cervical back: No tenderness.     Right lower leg: No edema.     Left lower leg: No edema.  Lymphadenopathy:     Cervical: No cervical adenopathy.  Skin:    General: Skin is warm and dry.  Neurological:     General: No focal deficit present.     Mental Status: He is alert. Mental status is at baseline.  Psychiatric:        Mood and Affect: Mood  normal.        Behavior: Behavior normal.         Assessment & Plan:   Assessment & Plan Asthma Asthma control suboptimal with Symbicort. Interested in Breztri  for better control due to preference for aerosol form. - Prescribed Breztri  inhaler once daily. - Continue albuterol  as needed. - Instructed to rinse mouth after inhaler use. - Provide Trelegy sample if Breztri  not covered.  Hypertension Blood pressure well-controlled with hydrochlorothiazide. Valsartan not currently taken. Home monitoring essential. - Continue hydrochlorothiazide 25 mg daily. - Monitor blood pressure at home. - Consider valsartan if home readings indicate need.  Deep vein thrombosis On Eliquis  2.5 mg twice daily. Evaluating bleeding risk and duration of therapy with hematology. - Continue Eliquis  2.5 mg twice daily. - Follow up with hematologist for therapy duration.  Arthritis Significant joint pain. Naproxen contraindicated with Eliquis . Tylenol  ineffective. Discussed naproxen risks. - Discontinue naproxen. - Consider Voltaren gel for pain relief.  Sleep apnea Symptoms suggest ongoing sleep apnea. Previous CPAP intolerance. Discussed re-evaluation and alternative treatments. - Refer to pulmonology for re-evaluation and potential sleep study. - Discuss alternative treatments with pulmonologist.  Gastroesophageal reflux disease (GERD) GERD managed with OTC Nexium. Considering pantoprazole  for better management. Prefers as-needed use. - Prescribe pantoprazole  20 mg as needed. - Educated on long-term PPI side effects, including nutrient malabsorption.  General Health Maintenance No pneumonia vaccine received. Discussed benefits due to history of severe COVID-19 and asthma. - Administer pneumonia vaccine today.  Follow-up Plans to monitor and adjust treatment. Discussed weight loss options and need for lab tests. - Schedule follow-up in one month. - Perform lab tests today: A1c, thyroid  function, vitamin D. - Review lab results and discuss weight loss medications at next visit.  - Ambulatory referral to Pulmonology - TSH - albuterol  (VENTOLIN  HFA) 108 (90 Base) MCG/ACT inhaler; Inhale 2 puffs into the lungs every 6 (six) hours as needed for wheezing or shortness of breath.  Dispense: 8 g;  Refill: 1 - budesonide-glycopyrrolate -formoterol  (BREZTRI AEROSPHERE) 160-9-4.8 MCG/ACT AERO inhaler; Inhale 2 puffs into the lungs 2 (two) times daily.  Dispense: 10.7 g; Refill: 11 - pantoprazole  (PROTONIX ) 20 MG tablet; Take 1 tablet (20 mg total) by mouth daily as needed for heartburn.  Dispense: 90 tablet; Refill: 0 - HgB A1c - TSH - Lipid Profile - Vitamin D (25 hydroxy) - Pneumococcal conjugate vaccine 20-valent (Prevnar 20)   Return in about 4 weeks (around 09/09/2023).   Rockney Cid, DO

## 2023-08-12 NOTE — Patient Instructions (Signed)
 It was great seeing you today!  Plan discussed at today's visit: -Blood work ordered today, results will be uploaded to MyChart.  -Change inhalers to Breztri daily, Albuterol  as needed -Change acid reflux medication to Pantoprazole  20 mg daily as needed -Hold Valsartan, continue hydrochlorothiazide 25 mg daily -Discontinue Naproxen due to interactions with Eliquis   -Prevnar 20 vaccine given today -Referral to Pulmonology placed for sleep study  Follow up in: 1 month  Take care and let us  know if you have any questions or concerns prior to your next visit.  Dr. Bud Care

## 2023-08-13 LAB — LIPID PANEL
Cholesterol: 158 mg/dL (ref <200)
HDL: 40 mg/dL (ref >=40)
LDL Cholesterol (Calc): 93 mg/dL
Non-HDL Cholesterol (Calc): 118 mg/dL (ref <130)
Total CHOL/HDL Ratio: 4 (calc) (ref <5.0)
Triglycerides: 157 mg/dL — ABNORMAL HIGH (ref <150)

## 2023-08-13 LAB — HEMOGLOBIN A1C
Hgb A1c MFr Bld: 5.9 % — ABNORMAL HIGH (ref <5.7)
Mean Plasma Glucose: 123 mg/dL
eAG (mmol/L): 6.8 mmol/L

## 2023-08-13 LAB — VITAMIN D 25 HYDROXY (VIT D DEFICIENCY, FRACTURES): Vit D, 25-Hydroxy: 39 ng/mL (ref 30–100)

## 2023-08-13 LAB — TSH: TSH: 4.43 m[IU]/L (ref 0.40–4.50)

## 2023-08-16 ENCOUNTER — Ambulatory Visit: Payer: Self-pay | Admitting: Internal Medicine

## 2023-08-21 ENCOUNTER — Encounter: Payer: Self-pay | Admitting: Emergency Medicine

## 2023-08-21 ENCOUNTER — Emergency Department
Admission: EM | Admit: 2023-08-21 | Discharge: 2023-08-21 | Disposition: A | Discharge status: Home / Self Care | Attending: Emergency Medicine | Admitting: Emergency Medicine

## 2023-08-21 ENCOUNTER — Emergency Department

## 2023-08-21 ENCOUNTER — Other Ambulatory Visit: Payer: Self-pay

## 2023-08-21 DIAGNOSIS — K5730 Diverticulosis of large intestine without perforation or abscess without bleeding: Secondary | ICD-10-CM | POA: Diagnosis not present

## 2023-08-21 DIAGNOSIS — Z7901 Long term (current) use of anticoagulants: Secondary | ICD-10-CM | POA: Diagnosis not present

## 2023-08-21 DIAGNOSIS — K449 Diaphragmatic hernia without obstruction or gangrene: Secondary | ICD-10-CM | POA: Diagnosis not present

## 2023-08-21 DIAGNOSIS — R109 Unspecified abdominal pain: Secondary | ICD-10-CM | POA: Diagnosis not present

## 2023-08-21 DIAGNOSIS — N132 Hydronephrosis with renal and ureteral calculous obstruction: Secondary | ICD-10-CM | POA: Insufficient documentation

## 2023-08-21 DIAGNOSIS — N200 Calculus of kidney: Secondary | ICD-10-CM | POA: Diagnosis not present

## 2023-08-21 DIAGNOSIS — I10 Essential (primary) hypertension: Secondary | ICD-10-CM | POA: Diagnosis not present

## 2023-08-21 LAB — URINALYSIS, ROUTINE W REFLEX MICROSCOPIC
Bilirubin Urine: NEGATIVE
Glucose, UA: NEGATIVE mg/dL
Ketones, ur: NEGATIVE mg/dL
Leukocytes,Ua: NEGATIVE
Nitrite: NEGATIVE
Protein, ur: 30 mg/dL — AB
RBC / HPF: >50 RBC/hpf (ref 0–5)
Specific Gravity, Urine: 1.016 (ref 1.005–1.030)
pH: 5 (ref 5.0–8.0)

## 2023-08-21 LAB — BASIC METABOLIC PANEL WITH GFR
Anion gap: 8 (ref 5–15)
BUN: 18 mg/dL (ref 8–23)
CO2: 26 mmol/L (ref 22–32)
Calcium: 9.1 mg/dL (ref 8.9–10.3)
Chloride: 103 mmol/L (ref 98–111)
Creatinine, Ser: 1.31 mg/dL — ABNORMAL HIGH (ref 0.61–1.24)
GFR, Estimated: >60 mL/min (ref >60)
Glucose, Bld: 127 mg/dL — ABNORMAL HIGH (ref 70–99)
Potassium: 4.4 mmol/L (ref 3.5–5.1)
Sodium: 137 mmol/L (ref 135–145)

## 2023-08-21 LAB — CBC
HCT: 42.8 % (ref 39.0–52.0)
Hemoglobin: 13.9 g/dL (ref 13.0–17.0)
MCH: 28.9 pg (ref 26.0–34.0)
MCHC: 32.5 g/dL (ref 30.0–36.0)
MCV: 89 fL (ref 80.0–100.0)
Platelets: 254 10*3/uL (ref 150–400)
RBC: 4.81 MIL/uL (ref 4.22–5.81)
RDW: 15 % (ref 11.5–15.5)
WBC: 6.9 10*3/uL (ref 4.0–10.5)
nRBC: 0 % (ref 0.0–0.2)

## 2023-08-21 MED ORDER — OXYCODONE HCL 5 MG PO TABS: 5.0000 mg | ORAL_TABLET | Freq: Three times a day (TID) | ORAL | 0 refills | Status: DC | PRN

## 2023-08-21 MED ORDER — MORPHINE SULFATE (PF) 4 MG/ML IV SOLN
4.0000 mg | Freq: Once | INTRAVENOUS | Status: AC
  Administered 2023-08-21: 4 mg via INTRAVENOUS
  Filled 2023-08-21: qty 1

## 2023-08-21 MED ORDER — OXYCODONE-ACETAMINOPHEN 5-325 MG PO TABS
1.0000 | ORAL_TABLET | ORAL | Status: AC
  Administered 2023-08-21: 1 via ORAL
  Filled 2023-08-21: qty 1

## 2023-08-21 MED ORDER — CEPHALEXIN 500 MG PO CAPS: 500.0000 mg | ORAL_CAPSULE | Freq: Two times a day (BID) | ORAL | 0 refills | Status: DC

## 2023-08-21 MED ORDER — SODIUM CHLORIDE 0.9 % IV SOLN
1.0000 g | INTRAVENOUS | Status: AC
  Administered 2023-08-21: 1 g via INTRAVENOUS
  Filled 2023-08-21: qty 10

## 2023-08-21 MED ORDER — ONDANSETRON 4 MG PO TBDP: 4.0000 mg | ORAL_TABLET | Freq: Three times a day (TID) | ORAL | 0 refills | Status: DC | PRN

## 2023-08-21 MED ORDER — ACETAMINOPHEN 500 MG PO TABS: 1000.0000 mg | ORAL_TABLET | Freq: Four times a day (QID) | ORAL | 2 refills | Status: AC | PRN

## 2023-08-21 MED ORDER — TAMSULOSIN HCL 0.4 MG PO CAPS
0.4000 mg | ORAL_CAPSULE | Freq: Every day | ORAL | 0 refills | Status: AC
Start: 2023-08-21 — End: 2023-08-31

## 2023-08-21 MED ORDER — ONDANSETRON HCL 4 MG/2ML IJ SOLN
4.0000 mg | INTRAMUSCULAR | Status: AC
  Administered 2023-08-21: 4 mg via INTRAVENOUS
  Filled 2023-08-21: qty 2

## 2023-08-21 NOTE — ED Provider Notes (Addendum)
 Patient signed out to me by Dr. Valetta Gaudy pending reassessment for pain.  In brief summary, 63 year old male presents with right flank pain.  CT abdomen pelvis with 6 mm right-sided urolithiasis with hydronephrosis.  Urinalysis equivocal for UTI.  Treated with ceftriaxone .  Discussed with urology who agrees overall likelihood of UTI is relatively low but does recommend empiric antibiotics of Keflex for 1 week with outpatient urology follow-up.  On my reassessment, pain adequately controlled, patient remains hemodynamically stable here.  No evidence of systemic infection at this time.  Will continue with plan per urology.  Rx Tylenol , Flomax , oxycodone , Zofran , Keflex.  On anticoagulation, NSAIDs contraindicated.  Counseled on oral hydration.  Updated ED course below.  Physical Exam  BP (!) 141/91 (BP Location: Right Arm)   Pulse 66   Temp 98.6 F (37 C) (Oral)   Resp (!) 22   Ht 5\' 7"  (1.702 m)   Wt (!) 147.4 kg   SpO2 94%   BMI 50.90 kg/m   Physical Exam  Procedures  Procedures  ED Course / MDM   Clinical Course as of 08/21/23 0903  Paulene Boron Aug 21, 2023  0619 Patient reports that he is only allergic to penicillin.  He has never had any issue with other antibiotic in has taken medication including amoxicillin without difficulty in the past.  It appears that he is also at least once received Rocephin  based on pharmacy review [MQ]  0619 I think the overall probability of urinary tract infection is relatively low, but given bacteria noted in the urine on a fairly clean sample will give dose of Rocephin  and urine culture [MQ]  4098 Case discussed with Dr. Jarvis Mesa, Urology. Reviewed imaging, UA, labs, etc.  [MQ]  [id number] Patient reevaluated, pain controlled.  Discussed findings and tentative plan for outpatient urology follow-up.  Will prescribe Tylenol , Zofran , oxycodone , Flomax , Keflex.  Strict ED return precautions discussed.  Patient and family agree with plan.  Will place urology referral.  Patient  agrees to discharge home. [MM]    Clinical Course User Index [MM] Collis Deaner, MD [MQ] Iver Marker, MD   Medical Decision Making Amount and/or Complexity of Data Reviewed Labs: ordered.  Risk OTC drugs. Prescription drug management.          Collis Deaner, MD 08/21/23 1191    Collis Deaner, MD 08/21/23 [phone]

## 2023-08-21 NOTE — ED Provider Notes (Signed)
 [facility] Provider Note    Event Date/Time   First MD Initiated Contact with Patient 08/21/23 [phone]     (approximate)   History   Flank Pain   HPI  Sean Garza is a 63 y.o. male history of DVT, lymphedema, hypertension and sleep apnea on Eliquis    About 2:30 in the morning he had sudden pain in his right flank.  Radiates slightly towards his groin.  Is very sharp in nature and associated with vomiting due to the degree of pain.  No fevers or chills no preceding recent illness.  Reports he had a kidney stone once around August 30, 2019 that he passed on his own, but he recalls that the pain feels the same.  He did not have to have any sort of surgery procedure to pass stone on previous  No nausea right now but vomiting due to pain earlier.  Reports a severe pain in his right flank and kidney area    Physical Exam   Triage Vital Signs: ED Triage Vitals  Encounter Vitals Group     BP 08/21/23 0504 (!) 167/98     Systolic BP Percentile --      Diastolic BP Percentile --      Pulse Rate 08/21/23 0504 69     Resp 08/21/23 0504 20     Temp 08/21/23 0504 98.6 F (37 C)     Temp Source 08/21/23 0504 Oral     SpO2 08/21/23 0504 96 %     Weight 08/21/23 0505 (!) 325 lb (147.4 kg)     Height 08/21/23 0505 5\' 7"  (1.702 m)     Head Circumference --      Peak Flow --      Pain Score --      Pain Loc --      Pain Education --      Exclude from Growth Chart --     Most recent vital signs: Vitals:   08/21/23 0637 08/21/23 0732  BP:    Pulse: 66   Resp:    Temp:    SpO2: 97% 97%     General: Awake, no distress except appears at least in moderate pain reporting pain in the right flank right costovertebral region CV:  Good peripheral perfusion.  Resp:  Normal effort.  Clear bilateral Abd:  No distention.  Soft nontender nondistended except reports some discomfort to palpation deep in the right flank.  Negative Murphy.  He reports moderate reproducible pain  to percussion of the right costovertebral angle only, none noted on the left Other:     ED Results / Procedures / Treatments   Labs (all labs ordered are listed, but only abnormal results are displayed) Labs Reviewed  URINALYSIS, ROUTINE W REFLEX MICROSCOPIC - Abnormal; Notable for the following components:      Result Value   Color, Urine YELLOW (*)    APPearance CLOUDY (*)    Hgb urine dipstick LARGE (*)    Protein, ur 30 (*)    Bacteria, UA RARE (*)    All other components within normal limits  BASIC METABOLIC PANEL WITH GFR - Abnormal; Notable for the following components:   Glucose, Bld 127 (*)    Creatinine, Ser 1.31 (*)    All other components within normal limits  URINE CULTURE  CBC     EKG     RADIOLOGY  No CT imaging interpreted by me as positive for kidney stone in the right side awaiting  final read by radiology  CT Renal Stone Study Result Date: 08/21/2023 CLINICAL DATA:  Abdominal pain/flank pain. EXAM: CT ABDOMEN AND PELVIS WITHOUT CONTRAST TECHNIQUE: Multidetector CT imaging of the abdomen and pelvis was performed following the standard protocol without IV contrast. RADIATION DOSE REDUCTION: This exam was performed according to the departmental dose-optimization program which includes automated exposure control, adjustment of the mA and/or kV according to patient size and/or use of iterative reconstruction technique. COMPARISON:  10/13/2019 FINDINGS: Lower chest: Chronic scarring noted within the lingula and right middle lobe. Mild mosaic attenuation pattern and diffuse bronchial wall thickening noted within the imaged portions of the lower lobes. No pleural effusion or consolidative change. Hepatobiliary: No focal liver abnormality is seen. No gallstones, gallbladder wall thickening, or biliary dilatation. Pancreas: Unremarkable. No pancreatic ductal dilatation or surrounding inflammatory changes. Spleen: Normal in size without focal abnormality. Adrenals/Urinary  Tract: Normal adrenal glands. There are multiple left kidney cysts measuring up to 4.8 cm. No follow-up imaging recommended. Punctate stone within the upper pole of left kidney is noted, image 71/5. There are 2 punctate stones within the inferior pole of the right kidney, image 71/5. Right-sided hydronephrosis with perinephric fat stranding. Within the proximal right ureter there is a 6 mm stone, image 53/2 and image 62/5. Urinary bladder appears decompressed. Stomach/Bowel: Small hiatal hernia. Stomach otherwise unremarkable. The appendix is visualized and appears normal. No bowel wall thickening, inflammation or distension. Colonic diverticulosis without acute diverticulitis. Vascular/Lymphatic: No significant vascular findings are present. No enlarged abdominal or pelvic lymph nodes. Reproductive: Prostate is unremarkable. Other: No free fluid or fluid collections. No signs of pneumoperitoneum. Musculoskeletal: No acute or significant osseous findings. Remote healed right lower rib fractures. No acute or suspicious osseous findings. Thoracolumbar degenerative disc disease. IMPRESSION: 1. Right-sided hydronephrosis with perinephric fat stranding secondary to a 6 mm stone within the proximal right ureter. 2. Bilateral nephrolithiasis. 3. Colonic diverticulosis without acute diverticulitis. 4. Small hiatal hernia. 5. Mild mosaic attenuation pattern and diffuse bronchial wall thickening noted within the imaged portions of the lower lobes. Findings are nonspecific but may be seen in the setting of small airways disease. Electronically Signed   By: Kimberley Penman M.D.   On: 08/21/2023 06:43      PROCEDURES:  Critical Care performed: No  Procedures   MEDICATIONS ORDERED IN ED: Medications  morphine  (PF) 4 MG/ML injection 4 mg (4 mg Intravenous Given 08/21/23 0622)  ondansetron  (ZOFRAN ) injection 4 mg (4 mg Intravenous Given 08/21/23 0622)  oxyCODONE -acetaminophen  (PERCOCET/ROXICET) 5-325 MG per tablet 1  tablet (1 tablet Oral Given 08/21/23 0655)  cefTRIAXone  (ROCEPHIN ) 1 g in sodium chloride  0.9 % 100 mL IVPB (0 g Intravenous Stopped 08/21/23 0717)  morphine  (PF) 4 MG/ML injection 4 mg (4 mg Intravenous Given 08/21/23 0747)     IMPRESSION / MDM / ASSESSMENT AND PLAN / ED COURSE  I reviewed the triage vital signs and the nursing notes.                              Differential diagnosis includes but is not limited to, abdominal perforation, aortic dissection, cholecystitis, appendicitis, diverticulitis, colitis, esophagitis/gastritis, kidney stone, pyelonephritis, urinary tract infection, aortic aneurysm. All are considered in decision and treatment plan. Based upon the patient's presentation and risk factors, as well as the nature of pain and history reported by patient I suspect this is likely nephrolithiasis.  On reviewing his CT scan it appears that he has  a moderate-sized kidney stone on the right side awaiting final read by radiologist.  Will treat for pain control, avoiding Toradol  due to very slight increase in baseline creatinine as well as his use of anticoagulant.  Will provide morphine ,.  He has previously taken oxycodone  with good effect.  Will also give dose of Rocephin    Patient's presentation is most consistent with acute complicated illness / injury requiring diagnostic workup.   Clinical Course as of 08/21/23 8119  Paulene Boron Aug 21, 2023  0619 Patient reports that he is only allergic to penicillin.  He has never had any issue with other antibiotic in has taken medication including amoxicillin without difficulty in the past.  It appears that he is also at least once received Rocephin  based on pharmacy review [MQ]  0619 I think the overall probability of urinary tract infection is relatively low, but given bacteria noted in the urine on a fairly clean sample will give dose of Rocephin  and urine culture [MQ]  1478 Case discussed with Dr. Jarvis Mesa, Urology. Reviewed imaging, UA, labs, etc.   [MQ]    Clinical Course User Index [MQ] Iver Marker, MD   Discussed CT imaging clinical presentation and history with Dr. Jarvis Mesa, he reviewed urinalysis CT labs advises reasonable to discharge with antibiotic and urology follow-up once pain controlled.  I him in agreement the patient does not have fever no elevated white count no symptoms would be suggestive of obvious infection though he does have some bacteria there are no no leukocytes no nitrites. Sent for culture.  Ongoing care assigned to my partner Dr. Cam Cava, with plan to follow-up on reassessment for pain control, and if discharge would need close urology follow-up in recommend brief 7-day course of cephalexin.  If pain not well-controlled or other complicating features urology available for further evaluation/consultation  FINAL CLINICAL IMPRESSION(S) / ED DIAGNOSES   Final diagnoses:  Kidney stone on right side     Rx / DC Orders   ED Discharge Orders     None        Note:  This document was prepared using Dragon voice recognition software and may include unintentional dictation errors.   Iver Marker, MD 08/21/23 [phone]

## 2023-08-21 NOTE — Discharge Instructions (Addendum)
 Your evaluation in the emergency department was notable for a right-sided kidney stone.  There is also evidence of a possible urinary tract infection, we are treating this out of caution as well.  Your case was discussed with the urologist, and they would like to see you in clinic--we have placed a referral for this.  I prescribed several pain and nausea medications to use in addition.  Please do follow-up with your urologist and primary care provider for reevaluation, and return to the emergency department with any new or worsening symptoms including fever, uncontrollable pain or vomiting, inability to urinate, or any other symptoms concerning to you.

## 2023-08-21 NOTE — ED Triage Notes (Signed)
 Arrived pov with right side flank pain that started around 0230. Denies urinary symptoms  Hx of kidney stones says pain feels the same

## 2023-08-22 LAB — URINE CULTURE: Culture: NO GROWTH

## 2023-08-26 ENCOUNTER — Ambulatory Visit: Payer: Self-pay

## 2023-08-26 ENCOUNTER — Ambulatory Visit: Admitting: Family Medicine

## 2023-08-26 ENCOUNTER — Encounter: Payer: Self-pay | Admitting: Family Medicine

## 2023-08-26 ENCOUNTER — Other Ambulatory Visit: Payer: Self-pay

## 2023-08-26 VITALS — BP 126/84 | HR 79 | Temp 98.1°F | Resp 18 | Ht 67.0 in | Wt 335.6 lb

## 2023-08-26 DIAGNOSIS — N200 Calculus of kidney: Secondary | ICD-10-CM

## 2023-08-26 DIAGNOSIS — I82409 Acute embolism and thrombosis of unspecified deep veins of unspecified lower extremity: Secondary | ICD-10-CM | POA: Diagnosis not present

## 2023-08-26 DIAGNOSIS — M10071 Idiopathic gout, right ankle and foot: Secondary | ICD-10-CM

## 2023-08-26 MED ORDER — PREDNISONE 10 MG PO TABS: 10.0000 mg | ORAL_TABLET | Freq: Two times a day (BID) | ORAL | 0 refills | Status: DC

## 2023-08-26 NOTE — Progress Notes (Signed)
 Name: Sean Garza   MRN: [id number]    DOB: 11-Jan-1961   Date:08/26/2023       Progress Note  Subjective  Chief Complaint  Chief Complaint  Patient presents with   Foot Swelling   Joint Swelling   Hematuria    Seen in ER   HPI   Patient was seen at [facility] on June 8 th due to right flank pain, he was diagnosed with kidney stone and hydronephrosis, given Rocephin  shot in the [facility] and sent home on Cephalexin , oxycodone  and zofran . He states since his visit he stopped taking eliquis   ( afraid of hematuria ) and over the past two days developed significant pain on right ankle and some pain on right knee. Pain is typical of his gout attacks and in the past prednisone worked well for him. The left ankle is swollen, tender to touch ( even the sheets ) has some increase in warmth.   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 He denies left calf pain but leg has been swollen since gout attack  Patient Active Problem List   Diagnosis Date Noted   Acute deep vein thrombosis (DVT) of proximal vein of left lower extremity (HCC) 03/25/2023   Pain and swelling of lower leg 11/02/2019   Pneumonia due to COVID-19 virus 04/05/2019   Acute hypoxemic respiratory failure (HCC) 04/05/2019   Acute hypoxemic respiratory failure due to severe acute respiratory syndrome coronavirus 2 (SARS-CoV-2) disease (HCC) 04/05/2019   Sleep apnea    Asthma    Tear of left glenoid labrum 03/24/2017   Rotator cuff tendinitis, left 02/28/2017   Complete tear of left rotator cuff 02/28/2017   Lymphedema    History of DVT (deep vein thrombosis)    Family history of coronary artery disease    Obesity, Class III, BMI 40-49.9 (morbid obesity)    Stress at work    Chest pain 08/06/2015    Past Surgical History:  Procedure Laterality Date    ACHILLES TENDON REPAIR     COLONOSCOPY WITH PROPOFOL  N/A 12/13/2016   Procedure: COLONOSCOPY WITH PROPOFOL ;  Surgeon: Cassie Click, MD;  Location: [facility] ENDOSCOPY;  Service: Endoscopy;  Laterality: N/A;   EYE SURGERY     floppy eye syndrome surgery   SHOULDER ARTHROSCOPY WITH OPEN ROTATOR CUFF REPAIR Left 03/24/2017   Procedure: SHOULDER ARTHROSCOPY WITH OPEN ROTATOR CUFF REPAIR;  Surgeon: Elner Hahn, MD;  Location: [facility] ORS;  Service: Orthopedics;  Laterality: Left;   UVULOPALATOPHARYNGOPLASTY      Family History  Problem Relation Age of Onset   CAD Father    Lung cancer Father    CAD Brother     Social History   Tobacco Use   Smoking status: Never    Passive exposure: Never   Smokeless tobacco: Never  Substance Use Topics   Alcohol use: No     Current Outpatient Medications:    acetaminophen  (TYLENOL ) 500 MG tablet, Take 2 tablets (1,000 mg total) by mouth every 6 (six) hours as needed., Disp: 100 tablet, Rfl: 2   albuterol  (VENTOLIN  HFA) 108 (90 Base) MCG/ACT inhaler, Inhale 2 puffs into the lungs every 6 (six) hours as needed for wheezing or shortness of breath., Disp: 8 g, Rfl: 1   Ascorbic Acid  (VITAMIN C) 1000  MG tablet, Take 1,000 mg by mouth daily., Disp: , Rfl:    budesonide-formoterol  (SYMBICORT) 160-4.5 MCG/ACT inhaler, Inhale 2 puffs into the lungs 2 (two) times daily as needed (for wheezing or shortness of breath.). , Disp: , Rfl:    budesonide-glycopyrrolate -formoterol  (BREZTRI  AEROSPHERE) 160-9-4.8 MCG/ACT AERO inhaler, Inhale 2 puffs into the lungs 2 (two) times daily., Disp: 10.7 g, Rfl: 11   ondansetron  (ZOFRAN -ODT) 4 MG disintegrating tablet, Take 1 tablet (4 mg total) by mouth every 8 (eight) hours as needed for nausea or vomiting., Disp: 20 tablet, Rfl: 0   oxyCODONE  (ROXICODONE ) 5 MG immediate release tablet, Take 1 tablet (5 mg total) by mouth every 8 (eight) hours as needed for breakthrough pain., Disp: 12 tablet, Rfl: 0   pantoprazole  (PROTONIX )  20 MG tablet, Take 1 tablet (20 mg total) by mouth daily as needed for heartburn., Disp: 90 tablet, Rfl: 0   sildenafil (REVATIO) 20 MG tablet, Take 20 mg by mouth daily. As needed, Disp: , Rfl:    tamsulosin  (FLOMAX ) 0.4 MG CAPS capsule, Take 1 capsule (0.4 mg total) by mouth daily for 10 days., Disp: 10 capsule, Rfl: 0   zinc  gluconate 50 MG tablet, Take 50 mg by mouth daily., Disp: , Rfl:    apixaban  (ELIQUIS ) 2.5 MG TABS tablet, Take 1 tablet (2.5 mg total) by mouth 2 (two) times daily. (Patient not taking: Reported on 08/26/2023), Disp: 60 tablet, Rfl: 6   cephALEXin  (KEFLEX ) 500 MG capsule, Take 1 capsule (500 mg total) by mouth 2 (two) times daily for 7 days. (Patient not taking: Reported on 08/26/2023), Disp: 14 capsule, Rfl: 0   hydrochlorothiazide (HYDRODIURIL) 25 MG tablet, Take 25 mg by mouth every morning., Disp: , Rfl:   Allergies  Allergen Reactions   Penicillins Other (See Comments)    Childhood reaction. Has patient had a PCN reaction causing immediate rash, facial/tongue/throat swelling, SOB or lightheadedness with hypotension: Unknown Has patient had a PCN reaction causing severe rash involving mucus membranes or skin necrosis: Unknown Has patient had a PCN reaction that required hospitalization: No Has patient had a PCN reaction occurring within the last 10 years: No If all of the above answers are NO, then may proceed with Cephalosporin use.     I personally reviewed active problem list, medication list, allergies with the patient/caregiver today.   ROS  Ten systems reviewed and is negative except as mentioned in HPI    Objective Physical Exam Constitutional: Patient appears well-developed and well-nourished. Obese  No distress.  HEENT: head atraumatic, normocephalic, pupils equal and reactive to light, neck supple Cardiovascular: Normal rate, regular rhythm and normal heart sounds.  No murmur heard. Left ankle very swollen and tender to touch, no redness. Calves  are non tender  Pulmonary/Chest: Effort normal and breath sounds normal. No respiratory distress. Abdominal: Soft.  There is no tenderness. Psychiatric: Patient has a normal mood and affect. behavior is normal. Judgment and thought content normal.   Vitals:   08/26/23 1442  BP: 126/84  Pulse: 79  Resp: 18  Temp: 98.1 F (36.7 C)  TempSrc: Oral  SpO2: 95%  Weight: (!) 335 lb 9.6 oz (152.2 kg)  Height: 5' 7 (1.702 m)    Body mass index is 52.56 kg/m.  Recent Results (from the past 2160 hours)  CMP (Cancer Center only)     Status: None   Collection Time: 07/01/23 11:08 AM  Result Value Ref Range   Sodium 141 135 - 145 mmol/L   Potassium  4.7 3.5 - 5.1 mmol/L   Chloride 107 98 - 111 mmol/L   CO2 26 22 - 32 mmol/L   Glucose, Bld 92 70 - 99 mg/dL    Comment: Glucose reference range applies only to samples taken after fasting for at least 8 hours.   BUN 18 8 - 23 mg/dL   Creatinine 7.82 9.56 - 1.24 mg/dL   Calcium 9.0 8.9 - 21.3 mg/dL   Total Protein 8.1 6.5 - 8.1 g/dL   Albumin 3.6 3.5 - 5.0 g/dL   AST 18 15 - 41 U/L   ALT 18 0 - 44 U/L   Alkaline Phosphatase 73 38 - 126 U/L   Total Bilirubin 1.0 0.0 - 1.2 mg/dL   GFR, Estimated >08 >65 mL/min    Comment: (NOTE) Calculated using the CKD-EPI Creatinine Equation (2021)    Anion gap 8 5 - 15    Comment: Performed at [facility], [address] [place], [place] [address]  CBC with Differential (Cancer Center Only)     Status: None   Collection Time: 07/01/23 11:08 AM  Result Value Ref Range   WBC Count 6.9 4.0 - 10.5 K/uL   RBC 4.97 4.22 - 5.81 MIL/uL   Hemoglobin 14.5 13.0 - 17.0 g/dL   HCT 62.9 52.8 - 41.3 %   MCV 89.1 80.0 - 100.0 fL   MCH 29.2 26.0 - 34.0 pg   MCHC 32.7 30.0 - 36.0 g/dL   RDW 24.4 01.0 - 27.2 %   Platelet Count 257 150 - 400 K/uL   nRBC 0.0 0.0 - 0.2 %   Neutrophils Relative % 67 %   Neutro Abs 4.6 1.7 - 7.7 K/uL   Lymphocytes Relative 22 %   Lymphs Abs 1.5 0.7 - 4.0 K/uL   Monocytes  Relative 8 %   Monocytes Absolute 0.6 0.1 - 1.0 K/uL   Eosinophils Relative 2 %   Eosinophils Absolute 0.2 0.0 - 0.5 K/uL   Basophils Relative 1 %   Basophils Absolute 0.1 0.0 - 0.1 K/uL   Immature Granulocytes 0 %   Abs Immature Granulocytes 0.03 0.00 - 0.07 K/uL    Comment: Performed at [facility], [address] [place], [place] [address]  HgB A1c     Status: Abnormal   Collection Time: 08/12/23  3:47 PM  Result Value Ref Range   Hgb A1c MFr Bld 5.9 (H) <5.7 %    Comment: For someone without known diabetes, a hemoglobin  A1c value between 5.7% and 6.4% is consistent with prediabetes and should be confirmed with a  follow-up test. . For someone with known diabetes, a value <7% indicates that their diabetes is well controlled. A1c targets should be individualized based on duration of diabetes, age, comorbid conditions, and other considerations. . This assay result is consistent with an increased risk of diabetes. . Currently, no consensus exists regarding use of hemoglobin A1c for diagnosis of diabetes for children. .    Mean Plasma Glucose 123 mg/dL   eAG (mmol/L) 6.8 mmol/L  TSH     Status: None   Collection Time: 08/12/23  3:47 PM  Result Value Ref Range   TSH 4.43 0.40 - 4.50 mIU/L  Lipid Profile     Status: Abnormal   Collection Time: 08/12/23  3:47 PM  Result Value Ref Range   Cholesterol 158 <200 mg/dL   HDL 40 > OR = 40 mg/dL   Triglycerides 403 (H) <150 mg/dL   LDL Cholesterol (Calc)  93 mg/dL (calc)    Comment: Reference range: <100 . Desirable range <100 mg/dL for primary prevention;   <70 mg/dL for patients with CHD or diabetic patients  with > or = 2 CHD risk factors. Aaron Aas LDL-C is now calculated using the Martin-Hopkins  calculation, which is a validated novel method providing  better accuracy than the Friedewald equation in the  estimation of LDL-C.  Melinda Sprawls et al. Erroll Heard. 1610;960(45): 2061-2068   (http://education.[facility].com/faq/FAQ164)    Total CHOL/HDL Ratio 4.0 <5.0 (calc)   Non-HDL Cholesterol (Calc) 118 <130 mg/dL (calc)    Comment: For patients with diabetes plus 1 major ASCVD risk  factor, treating to a non-HDL-C goal of <100 mg/dL  (LDL-C of <40 mg/dL) is considered a therapeutic  option.   Vitamin D  (25 hydroxy)     Status: None   Collection Time: 08/12/23  3:47 PM  Result Value Ref Range   Vit D, 25-Hydroxy 39 30 - 100 ng/mL    Comment: Vitamin D  Status         25-OH Vitamin D : . Deficiency:                    <20 ng/mL Insufficiency:             20 - 29 ng/mL Optimal:                 > or = 30 ng/mL . For 25-OH Vitamin D  testing on patients on  D2-supplementation and patients for whom quantitation  of D2 and D3 fractions is required, the [employer]ureD(TM) 25-OH VIT D, (D2,D3), LC/MS/MS is recommended: order  code 98119 (patients >32yrs). . See Note 1 . Note 1 . For additional information, please refer to  http://education.[facility].com/faq/FAQ199  (This link is being provided for informational/ educational purposes only.)   Basic metabolic panel     Status: Abnormal   Collection Time: 08/21/23  5:07 AM  Result Value Ref Range   Sodium 137 135 - 145 mmol/L   Potassium 4.4 3.5 - 5.1 mmol/L   Chloride 103 98 - 111 mmol/L   CO2 26 22 - 32 mmol/L   Glucose, Bld 127 (H) 70 - 99 mg/dL    Comment: Glucose reference range applies only to samples taken after fasting for at least 8 hours.   BUN 18 8 - 23 mg/dL   Creatinine, Ser 1.47 (H) 0.61 - 1.24 mg/dL   Calcium 9.1 8.9 - 82.9 mg/dL   GFR, Estimated >56 >21 mL/min    Comment: (NOTE) Calculated using the CKD-EPI Creatinine Equation (2021)    Anion gap 8 5 - 15    Comment: Performed at [facility], [address] [place], [place] [address]  CBC     Status: None   Collection Time: 08/21/23  5:07 AM  Result Value Ref Range   WBC 6.9 4.0 - 10.5 K/uL   RBC 4.81 4.22 - 5.81  MIL/uL   Hemoglobin 13.9 13.0 - 17.0 g/dL   HCT 78.4 69.6 - 29.5 %   MCV 89.0 80.0 - 100.0 fL   MCH 28.9 26.0 - 34.0 pg   MCHC 32.5 30.0 - 36.0 g/dL   RDW 28.4 13.2 - 44.0 %   Platelets 254 150 - 400 K/uL   nRBC 0.0 0.0 - 0.2 %    Comment: Performed at [facility], [address] [place], [place] [address]  Urinalysis, Routine w reflex microscopic -Urine, Clean Catch     Status: Abnormal  Collection Time: 08/21/23  5:59 AM  Result Value Ref Range   Color, Urine YELLOW (A) YELLOW   APPearance CLOUDY (A) CLEAR   Specific Gravity, Urine 1.016 1.005 - 1.030   pH 5.0 5.0 - 8.0   Glucose, UA NEGATIVE NEGATIVE mg/dL   Hgb urine dipstick LARGE (A) NEGATIVE   Bilirubin Urine NEGATIVE NEGATIVE   Ketones, ur NEGATIVE NEGATIVE mg/dL   Protein, ur 30 (A) NEGATIVE mg/dL   Nitrite NEGATIVE NEGATIVE   Leukocytes,Ua NEGATIVE NEGATIVE   RBC / HPF >50 0 - 5 RBC/hpf   WBC, UA 11-20 0 - 5 WBC/hpf   Bacteria, UA RARE (A) NONE SEEN   Squamous Epithelial / HPF 0-5 0 - 5 /HPF   Mucus PRESENT     Comment: Performed at [facility], [address], [place], [place] [address]  Urine Culture     Status: None   Collection Time: 08/21/23  5:59 AM   Specimen: Urine, Clean Catch  Result Value Ref Range   Specimen Description      URINE, CLEAN CATCH Performed at [facility], [address], [place], [place] [address]    Special Requests      NONE Performed at [facility], [address], [place], [place] [address]    Culture      NO GROWTH Performed at [facility], [address] [place], [place] [address]    Report Status 08/22/2023 FINAL         08/12/2023    3:01 PM  Fall Risk   Falls in the past year? 0  Number falls in past yr: 0  Injury with Fall? 0  Risk for fall due to : No Fall Risks  Follow up Falls evaluation completed     Assessment & Plan  1. Right nephrolithiasis (Primary)  Passed stone at home  2. Acute idiopathic  gout of right ankle  - predniSONE (DELTASONE) 10 MG tablet; Take 1 tablet (10 mg total) by mouth 2 (two) times daily with a meal.  Dispense: 6 tablet; Refill: 0  3. Recurrent kidney stones  - Ambulatory referral to Urology  4. Recurrent acute deep vein thrombosis (DVT) of lower extremity, unspecified laterality (HCC)   First on right - provoked, second on left unprovoked. Explained he must resume Eliquis  at 2.5 mg bid. Explained if leg stays swollen needs to

## 2023-08-26 NOTE — Telephone Encounter (Addendum)
 FYI Only or Action Required?: FYI only for provider  Patient was last seen in primary care on 08/12/2023 by Rockney Cid, DO. Called Nurse Triage reporting No chief complaint on file.. Symptoms began several days ago. Interventions attempted: Prescription medications: oxycodone  Cefdinir . Symptoms are: gradually worsening.  Triage Disposition: appointment today today  Patient/caregiver understands and will follow disposition?: yes Pt did not take abx today thinks is side effects               Copied from CRM [id number]. Topic: Clinical - Red Word Triage >> Aug 26, 2023  8:20 AM Oddis Bench wrote: Red Word that prompted transfer to Nurse Triage: patient is calling about going to the Er he had a kidney stone and gave him med for bacterial in urine cephalexin  500 mg and it has him with headache, nausea plus gout in his ankle and left knee. Ankle is swollen and has to use crutches to walk. Reason for Disposition  [1] Redness AND [2] painful when touched AND [3] no fever  Answer Assessment - Initial Assessment Questions 1. LOCATION: Which ankle is swollen? Where is the swelling?     Left  around the ankle 2. ONSET: When did the swelling start?     08/25/23 3. SWELLING: How bad is the swelling? Or, How large is it? (e.g., mild, moderate, severe; size of localized swelling)    - NONE: No joint swelling.   - LOCALIZED: Localized; small area of puffy or swollen skin (e.g., insect bite, skin irritation).   - MILD: Joint looks or feels mildly swollen or puffy.   - MODERATE: Swollen; interferes with normal activities (e.g., work or school); decreased range of movement; may be limping.   - SEVERE: Very swollen; can't move swollen joint at all; limping a lot or unable to walk.     moderate 4. PAIN: Is there any pain? If Yes, ask: How bad is it? (Scale 1-10; or mild, moderate, severe)   - NONE (0): no pain.   - MILD (1-3): doesn't interfere with normal activities.    - MODERATE  (4-7): interferes with normal activities (e.g., work or school) or awakens from sleep, limping.    - SEVERE (8-10): excruciating pain, unable to do any normal activities, unable to walk.      Mod to severe varies 5. CAUSE: What do you think caused the ankle swelling?     gout 6. OTHER SYMPTOMS: Do you have any other symptoms? (e.g., fever, chest pain, difficulty breathing, calf pain)     Nausea, left knee pain 7. PREGNANCY: Is there any chance you are pregnant? When was your last menstrual period?     N/a  Protocols used: Ankle Swelling-A-AH

## 2023-09-05 ENCOUNTER — Other Ambulatory Visit (INDEPENDENT_AMBULATORY_CARE_PROVIDER_SITE_OTHER): Payer: Self-pay | Admitting: Nurse Practitioner

## 2023-09-05 DIAGNOSIS — G8929 Other chronic pain: Secondary | ICD-10-CM

## 2023-09-05 DIAGNOSIS — Z86718 Personal history of other venous thrombosis and embolism: Secondary | ICD-10-CM

## 2023-09-06 ENCOUNTER — Encounter (INDEPENDENT_AMBULATORY_CARE_PROVIDER_SITE_OTHER): Payer: BC Managed Care – PPO

## 2023-09-06 ENCOUNTER — Encounter (INDEPENDENT_AMBULATORY_CARE_PROVIDER_SITE_OTHER): Payer: Self-pay | Admitting: Nurse Practitioner

## 2023-09-06 ENCOUNTER — Ambulatory Visit (INDEPENDENT_AMBULATORY_CARE_PROVIDER_SITE_OTHER): Payer: BC Managed Care – PPO | Admitting: Nurse Practitioner

## 2023-09-06 VITALS — BP 114/77 | HR 79 | Ht 67.0 in | Wt 333.8 lb

## 2023-09-06 DIAGNOSIS — M25572 Pain in left ankle and joints of left foot: Secondary | ICD-10-CM | POA: Diagnosis not present

## 2023-09-06 DIAGNOSIS — I8391 Asymptomatic varicose veins of right lower extremity: Secondary | ICD-10-CM | POA: Diagnosis not present

## 2023-09-06 DIAGNOSIS — G8929 Other chronic pain: Secondary | ICD-10-CM

## 2023-09-06 DIAGNOSIS — I82432 Acute embolism and thrombosis of left popliteal vein: Secondary | ICD-10-CM | POA: Diagnosis not present

## 2023-09-06 NOTE — Progress Notes (Signed)
 Subjective:    Patient ID: Sean Garza, male    DOB: 10-Jul-1960, 63 y.o.   MRN: [id number] Chief Complaint  Patient presents with   Follow-up    - 6 month DVT + see fb     The patient returns today for evaluation of a DVT in his left lower extremity.  At that time he was also having significant ankle pain as well.  Since his last visit the left lower extremity DVT has been resolved as evidence from recent DVT study.  In addition he visited podiatry and it was noted that he had significant arthritis in his ankles and he received a steroid shot into the ankle pain that he was having previously is also much better.  He has previously had a DVT in his right lower extremity that was provoked postsurgery.  He has visited hematology for further workup for possible hypercoagulable state.  It was noted that the patient does not have any known hypercoagulable disorders but it is felt that given his 2 DVTs it would be best for the patient to move forward with prophylactic anticoagulation.  He has been transition by hematology to 2.5 mg of Eliquis  twice daily.  The patient does endorse having some occasional swelling sporadically bilaterally but nothing significant or painful such as when he had his DVT.  He also notes having some notable varicosities in the right lower extremity but there not painful or discomfort taking to him at this time.    Review of Systems  Cardiovascular:  Positive for leg swelling.  Musculoskeletal:  Positive for arthralgias.  All other systems reviewed and are negative.      Objective:   Physical Exam Vitals reviewed.  HENT:     Head: Normocephalic.   Cardiovascular:     Rate and Rhythm: Normal rate.  Pulmonary:     Effort: Pulmonary effort is normal.   Musculoskeletal:     Right lower leg: Edema present.     Left lower leg: Edema present.   Skin:    General: Skin is warm and dry.   Neurological:     Mental Status: He is alert and oriented to person,  place, and time.   Psychiatric:        Mood and Affect: Mood normal.        Behavior: Behavior normal.        Thought Content: Thought content normal.        Judgment: Judgment normal.     BP 114/77   Pulse 79   Ht 5' 7 (1.702 m)   Wt (!) 333 lb 12.8 oz (151.4 kg)   BMI 52.28 kg/m   Past Medical History:  Diagnosis Date   Asthma    Collagen vascular disease (HCC)    Rhematoid Arthritis   GERD (gastroesophageal reflux disease)    Gout    Hx of blood clots    Hypertension    Sleep apnea     Social History   Socioeconomic History   Marital status: Single    Spouse name: Not on file   Number of children: Not on file   Years of education: Not on file   Highest education level: Not on file  Occupational History   Not on file  Tobacco Use   Smoking status: Never    Passive exposure: Never   Smokeless tobacco: Never  Vaping Use   Vaping status: Never Used  Substance and Sexual Activity   Alcohol use: No  Drug use: No   Sexual activity: Not Currently  Other Topics Concern   Not on file  Social History Narrative   Not on file   Social Drivers of Health   Financial Resource Strain: Low Risk  (03/25/2023)   Overall Financial Resource Strain (CARDIA)    Difficulty of Paying Living Expenses: Not hard at all  Food Insecurity: No Food Insecurity (03/25/2023)   Hunger Vital Sign    Worried About Running Out of Food in the Last Year: Never true    Ran Out of Food in the Last Year: Never true  Transportation Needs: No Transportation Needs (03/25/2023)   PRAPARE - Administrator, Civil Service (Medical): No    Lack of Transportation (Non-Medical): No  Physical Activity: Not on file  Stress: Not on file  Social Connections: Unknown (07/28/2021)   Received from [facility]   Social Network    Social Network: Not on file  Intimate Partner Violence: Not At Risk (03/25/2023)   Humiliation, Afraid, Rape, and Kick questionnaire    Fear of Current or  Ex-Partner: No    Emotionally Abused: No    Physically Abused: No    Sexually Abused: No    Past Surgical History:  Procedure Laterality Date   ACHILLES TENDON REPAIR     COLONOSCOPY WITH PROPOFOL  N/A 12/13/2016   Procedure: COLONOSCOPY WITH PROPOFOL ;  Surgeon: Viktoria Lamar DASEN, MD;  Location: [facility] ENDOSCOPY;  Service: Endoscopy;  Laterality: N/A;   EYE SURGERY     floppy eye syndrome surgery   SHOULDER ARTHROSCOPY WITH OPEN ROTATOR CUFF REPAIR Left 03/24/2017   Procedure: SHOULDER ARTHROSCOPY WITH OPEN ROTATOR CUFF REPAIR;  Surgeon: Edie Norleen PARAS, MD;  Location: [facility] ORS;  Service: Orthopedics;  Laterality: Left;   UVULOPALATOPHARYNGOPLASTY      Family History  Problem Relation Age of Onset   CAD Father    Lung cancer Father    CAD Brother     Allergies  Allergen Reactions   Penicillins Other (See Comments)    Childhood reaction. Has patient had a PCN reaction causing immediate rash, facial/tongue/throat swelling, SOB or lightheadedness with hypotension: Unknown Has patient had a PCN reaction causing severe rash involving mucus membranes or skin necrosis: Unknown Has patient had a PCN reaction that required hospitalization: No Has patient had a PCN reaction occurring within the last 10 years: No If all of the above answers are NO, then may proceed with Cephalosporin use.        Latest Ref Rng & Units 08/21/2023    5:07 AM 07/01/2023   11:08 AM 03/25/2023   10:14 AM  CBC  WBC 4.0 - 10.5 K/uL 6.9  6.9  6.2   Hemoglobin 13.0 - 17.0 g/dL 86.0  85.4  86.0   Hematocrit 39.0 - 52.0 % 42.8  44.3  42.4   Platelets 150 - 400 K/uL 254  257  241       CMP     Component Value Date/Time   NA 137 08/21/2023 0507   NA 142 05/24/2011 0057   K 4.4 08/21/2023 0507   K 4.1 05/24/2011 0057   CL 103 08/21/2023 0507   CL 103 05/24/2011 0057   CO2 26 08/21/2023 0507   CO2 25 05/24/2011 0057   GLUCOSE 127 (H) 08/21/2023 0507   GLUCOSE 96 05/24/2011 0057   BUN 18 08/21/2023 0507    BUN 16 05/24/2011 0057   CREATININE 1.31 (H) 08/21/2023 0507   CREATININE 1.06 07/01/2023 1108  CREATININE 0.83 05/24/2011 0057   CALCIUM 9.1 08/21/2023 0507   CALCIUM 8.6 05/24/2011 0057   PROT 8.1 07/01/2023 1108   ALBUMIN 3.6 07/01/2023 1108   AST 18 07/01/2023 1108   ALT 18 07/01/2023 1108   ALKPHOS 73 07/01/2023 1108   BILITOT 1.0 07/01/2023 1108   GFRNONAA >60 08/21/2023 0507   GFRNONAA >60 07/01/2023 1108   GFRNONAA >60 05/24/2011 0057     No results found.     Assessment & Plan:   1. Deep vein thrombosis (DVT) of popliteal vein of left lower extremity, unspecified chronicity (HCC) (Primary) Left lower extremity popliteal DVT has resolved.  We discussed postphlebitic symptoms as well as ongoing treatment of these which would include use of medical grade compression socks to be worn daily, elevation and activity.  At this time he is not having any significant postphlebitic changes.  He continues to follow with hematology and has an upcoming visit with them.  Based on this he will continue to follow with hematology for his anticoagulation and he will follow-up with us  on an as-needed basis.  2. Chronic pain of left ankle Pain related to arthritic changes.  He will continue to follow with podiatry if he has ongoing issues.  3. Asymptomatic varicose veins of right lower extremity Currently he is not having any pain or discomfort in the varicosities.  We discussed symptomatic changes such as pain, tenderness or discomfort in the varicosities.  In addition he is not experience itching of the area.  We discussed conservative therapies including use of medical grade compression, elevation and activity for treatment of varicosities.  Patient is advised to follow-up with us  if it becomes symptomatic and painful.    Current Outpatient Medications on File Prior to Visit  Medication Sig Dispense Refill   acetaminophen  (TYLENOL ) 500 MG tablet Take 2 tablets (1,000 mg total) by mouth  every 6 (six) hours as needed. 100 tablet 2   albuterol  (VENTOLIN  HFA) 108 (90 Base) MCG/ACT inhaler Inhale 2 puffs into the lungs every 6 (six) hours as needed for wheezing or shortness of breath. 8 g 1   apixaban  (ELIQUIS ) 2.5 MG TABS tablet Take 1 tablet (2.5 mg total) by mouth 2 (two) times daily. 60 tablet 6   Ascorbic Acid  (VITAMIN C) 1000 MG tablet Take 1,000 mg by mouth daily.     budesonide-glycopyrrolate -formoterol  (BREZTRI  AEROSPHERE) 160-9-4.8 MCG/ACT AERO inhaler Inhale 2 puffs into the lungs 2 (two) times daily. 10.7 g 11   cholecalciferol (VITAMIN D3) 25 MCG (1000 UNIT) tablet Take 1,000 Units by mouth daily.     pantoprazole  (PROTONIX ) 20 MG tablet Take 1 tablet (20 mg total) by mouth daily as needed for heartburn. 90 tablet 0   sildenafil (REVATIO) 20 MG tablet Take 20 mg by mouth daily. As needed     zinc  gluconate 50 MG tablet Take 50 mg by mouth daily.     budesonide-formoterol  (SYMBICORT) 160-4.5 MCG/ACT inhaler Inhale 2 puffs into the lungs 2 (two) times daily as needed (for wheezing or shortness of breath.).  (Patient not taking: Reported on 09/06/2023)     ondansetron  (ZOFRAN -ODT) 4 MG disintegrating tablet Take 1 tablet (4 mg total) by mouth every 8 (eight) hours as needed for nausea or vomiting. (Patient not taking: Reported on 09/06/2023) 20 tablet 0   oxyCODONE  (ROXICODONE ) 5 MG immediate release tablet Take 1 tablet (5 mg total) by mouth every 8 (eight) hours as needed for breakthrough pain. (Patient not taking: Reported on 09/06/2023) 12 tablet  0   predniSONE  (DELTASONE ) 10 MG tablet Take 1 tablet (10 mg total) by mouth 2 (two) times daily with a meal. (Patient not taking: Reported on 09/06/2023) 6 tablet 0   No current facility-administered medications on file prior to visit.    There are no Patient Instructions on file for this visit. No follow-ups on file.   Tramaine Sauls E Irlanda Croghan, NP

## 2023-09-13 ENCOUNTER — Encounter: Payer: Self-pay | Admitting: Internal Medicine

## 2023-09-13 ENCOUNTER — Ambulatory Visit: Admitting: Internal Medicine

## 2023-09-13 ENCOUNTER — Other Ambulatory Visit: Payer: Self-pay

## 2023-09-13 VITALS — BP 130/72 | HR 84 | Temp 98.0°F | Resp 18 | Ht 67.0 in | Wt 332.9 lb

## 2023-09-13 DIAGNOSIS — E66813 Obesity, class 3: Secondary | ICD-10-CM

## 2023-09-13 DIAGNOSIS — M1990 Unspecified osteoarthritis, unspecified site: Secondary | ICD-10-CM | POA: Diagnosis not present

## 2023-09-13 DIAGNOSIS — G4733 Obstructive sleep apnea (adult) (pediatric): Secondary | ICD-10-CM | POA: Diagnosis not present

## 2023-09-13 DIAGNOSIS — M109 Gout, unspecified: Secondary | ICD-10-CM

## 2023-09-13 DIAGNOSIS — J4520 Mild intermittent asthma, uncomplicated: Secondary | ICD-10-CM | POA: Diagnosis not present

## 2023-09-13 DIAGNOSIS — I872 Venous insufficiency (chronic) (peripheral): Secondary | ICD-10-CM

## 2023-09-13 DIAGNOSIS — Z6841 Body Mass Index (BMI) 40.0 and over, adult: Secondary | ICD-10-CM

## 2023-09-13 DIAGNOSIS — R7303 Prediabetes: Secondary | ICD-10-CM

## 2023-09-13 MED ORDER — TRELEGY ELLIPTA 100-62.5-25 MCG/ACT IN AEPB: 1.0000 | INHALATION_SPRAY | Freq: Every day | RESPIRATORY_TRACT | 11 refills | Status: DC

## 2023-09-13 MED ORDER — PREDNISONE 10 MG PO TABS
ORAL_TABLET | ORAL | 0 refills | Status: AC
Start: 2023-09-13 — End: 2023-09-19

## 2023-09-13 MED ORDER — ZEPBOUND 2.5 MG/0.5ML ~~LOC~~ SOAJ: 2.5000 mg | SUBCUTANEOUS | 0 refills | Status: DC

## 2023-09-13 NOTE — Patient Instructions (Signed)
 Low-Purine Eating Plan A low-purine eating plan involves making food choices to limit your purine intake. Purine is a kind of uric acid. Too much uric acid in your blood can cause certain conditions, such as gout and kidney stones. Eating a low-purine diet may help control these conditions. What are tips for following this plan? Shopping Avoid buying products that contain high-fructose corn syrup. Check for this on food labels. It is commonly found in many processed foods and soft drinks. Be sure to check for it in baked goods such as cookies, canned fruits, and cereals and cereal bars. Avoid buying veal, chicken breast with skin, lamb, and organ meats such as liver. These types of meats tend to have the highest purine content. Choose dairy products. These may lower uric acid levels. Avoid certain types of fish. Not all fish and seafood have high purine content. Examples with high purine content include anchovies, trout, tuna, sardines, and salmon. Avoid buying beverages that contain alcohol, particularly beer and hard liquor. Alcohol can affect the way your body gets rid of uric acid. Meal planning  Learn which foods do or do not affect you. If you find out that a food tends to cause your gout symptoms to flare up, avoid eating that food. You can enjoy foods that do not cause problems. If you have any questions about a food item, talk with your dietitian or health care provider. Reduce the overall amount of meat in your diet. When you do eat meat, choose ones with lower purine content. Include plenty of fruits and vegetables. Although some vegetables may have a high purine content--such as asparagus, mushrooms, spinach, or cauliflower--it has been shown that these do not contribute to uric acid blood levels as much. Consume at least 1 dairy serving a day. This has been shown to decrease uric acid levels. General information If you drink alcohol: Limit how much you have to: 0-1 drink a day for  women who are not pregnant. 0-2 drinks a day for men. Know how much alcohol is in a drink. In the [place] one drink equals one 12 oz bottle of beer (355 mL), one 5 oz glass of wine (148 mL), or one 1 oz glass of hard liquor (44 mL). Drink plenty of water. Try to drink enough to keep your urine pale yellow. Fluids can help remove uric acid from your body. Work with your health care provider and dietitian to develop a plan to achieve or maintain a healthy weight. Losing weight may help reduce uric acid in your blood. What foods are recommended? The following are some types of foods that are good choices when limiting purine intake: Fresh or frozen fruits and vegetables. Whole grains, breads, cereals, and pasta. Rice. Beans, peas, legumes. Nuts and seeds. Dairy products. Fats and oils. The items listed above may not be a complete list. Talk with a dietitian about what dietary choices are best for you. What foods are not recommended? Limit your intake of foods high in purines, including: Beer and other alcohol. Meat-based gravy or sauce. Canned or fresh fish, such as: Anchovies, sardines, herring, salmon, and tuna. Mussels and scallops. Codfish, trout, and haddock. Bacon, veal, chicken breast with skin, and lamb. Organ meats, such as: Liver or kidney. Tripe. Sweetbreads (thymus gland or pancreas). Wild Education officer, environmental. Yeast or yeast extract supplements. Drinks sweetened with high-fructose corn syrup, such as soda. Processed foods made with high-fructose corn syrup. The items listed above may not be a complete list of foods  and beverages you should limit. Contact a dietitian for more information. Summary Eating a low-purine diet may help control conditions caused by too much uric acid in the body, such as gout or kidney stones. Choose low-purine foods, limit alcohol, and limit high-fructose corn syrup. You will learn over time which foods do or do not affect you. If you find out that a  food tends to cause your gout symptoms to flare up, avoid eating that food. This information is not intended to replace advice given to you by your health care provider. Make sure you discuss any questions you have with your health care provider. Document Revised: 02/12/2021 Document Reviewed: 02/12/2021 Elsevier Patient Education  2025 [employer].

## 2023-09-13 NOTE — Progress Notes (Addendum)
 Established Patient Office Visit  Subjective    Patient ID: Sean Garza, male    DOB: 07-26-1960  Age: 63 y.o. MRN: [id number]  CC:  Chief Complaint  Patient presents with   Follow-up    4 week recheck    HPI Sean Garza presents for follow up from last visit.   Discussed the use of AI scribe software for clinical note transcription with the patient, who gave verbal consent to proceed.  History of Present Illness  Sean Garza is a 63 year old male with gout who presents for a follow-up visit.  He recently experienced a gout flare affecting his knee and ankle after a hospital visit for kidney stones. Prednisone  resolved the symptoms, but he now has pain in his right big toe. Gout occurs three to four times a year, affecting his knee and ankle, with severe pain requiring crutches.  He recently passed a kidney stone without surgical intervention and developed a urinary tract infection, treated with antibiotics. No current urinary symptoms are present.  He has asthma and was using a Trelegy inhaler effectively. He has not picked up the prescribed Breztri  inhaler due to insurance coverage concerns.  He is not on blood pressure medication as his blood pressure is stable. He is on Eliquis  for anticoagulation due to previous blood clots in his leg.  He takes pantoprazole  for acid reflux, which is effective. He is aware of his prediabetic status with an A1c of 5.9 and is monitoring his condition.   Hypertension/OSA: -Medications: had been on hydrochlorothiazide 25 mg but discontinued after the first gout attack -Denies any SOB, CP, vision changes, LE edema or symptoms of hypotension -Diagnosed with severe OSA in the past, underwent oral surgery but symptoms are still present and cannot tolerate CPAP.  Hx of DVT: -Hx of original provoked DVT in RLE, then unprovoked in LLE -Currently on Eliquis  2.5 mg BID  -Following with Hematology   Asthma:  -Asthma status:  uncontrolled -Current Treatments: Symbicort, Albuterol  PRN - did well with Trelegy sample -Satisfied with current treatment?: no -Visits to ER or Urgent Care in past year: no -Pneumovax: Not up to Date -Influenza: unknown -Hx of ICU admission for COVID-19 PNA in 2021 (refuses COVID vaccines)  GERD: -Currently on Nexium 20 mg OTC PRN  Health Maintenance: -Blood work UTD    Outpatient Encounter Medications as of 09/13/2023  Medication Sig   acetaminophen  (TYLENOL ) 500 MG tablet Take 2 tablets (1,000 mg total) by mouth every 6 (six) hours as needed.   albuterol  (VENTOLIN  HFA) 108 (90 Base) MCG/ACT inhaler Inhale 2 puffs into the lungs every 6 (six) hours as needed for wheezing or shortness of breath.   apixaban  (ELIQUIS ) 2.5 MG TABS tablet Take 1 tablet (2.5 mg total) by mouth 2 (two) times daily.   Ascorbic Acid  (VITAMIN C) 1000 MG tablet Take 1,000 mg by mouth daily.   budesonide-glycopyrrolate -formoterol  (BREZTRI  AEROSPHERE) 160-9-4.8 MCG/ACT AERO inhaler Inhale 2 puffs into the lungs 2 (two) times daily.   cholecalciferol (VITAMIN D3) 25 MCG (1000 UNIT) tablet Take 1,000 Units by mouth daily.   pantoprazole  (PROTONIX ) 20 MG tablet Take 1 tablet (20 mg total) by mouth daily as needed for heartburn.   sildenafil (REVATIO) 20 MG tablet Take 20 mg by mouth daily. As needed   zinc  gluconate 50 MG tablet Take 50 mg by mouth daily.   budesonide-formoterol  (SYMBICORT) 160-4.5 MCG/ACT inhaler Inhale 2 puffs into the lungs 2 (two) times daily as needed (for  wheezing or shortness of breath.).  (Patient not taking: Reported on 09/13/2023)   ondansetron  (ZOFRAN -ODT) 4 MG disintegrating tablet Take 1 tablet (4 mg total) by mouth every 8 (eight) hours as needed for nausea or vomiting. (Patient not taking: Reported on 09/13/2023)   oxyCODONE  (ROXICODONE ) 5 MG immediate release tablet Take 1 tablet (5 mg total) by mouth every 8 (eight) hours as needed for breakthrough pain. (Patient not taking: Reported on  09/06/2023)   predniSONE  (DELTASONE ) 10 MG tablet Take 1 tablet (10 mg total) by mouth 2 (two) times daily with a meal. (Patient not taking: Reported on 09/13/2023)   No facility-administered encounter medications on file as of 09/13/2023.    Past Medical History:  Diagnosis Date   Asthma    Collagen vascular disease (HCC)    Rhematoid Arthritis   GERD (gastroesophageal reflux disease)    Gout    Hx of blood clots    Hypertension    Sleep apnea     Past Surgical History:  Procedure Laterality Date   ACHILLES TENDON REPAIR     COLONOSCOPY WITH PROPOFOL  N/A 12/13/2016   Procedure: COLONOSCOPY WITH PROPOFOL ;  Surgeon: Viktoria Lamar DASEN, MD;  Location: [facility] ENDOSCOPY;  Service: Endoscopy;  Laterality: N/A;   EYE SURGERY     floppy eye syndrome surgery   SHOULDER ARTHROSCOPY WITH OPEN ROTATOR CUFF REPAIR Left 03/24/2017   Procedure: SHOULDER ARTHROSCOPY WITH OPEN ROTATOR CUFF REPAIR;  Surgeon: Edie Norleen PARAS, MD;  Location: [facility] ORS;  Service: Orthopedics;  Laterality: Left;   UVULOPALATOPHARYNGOPLASTY      Family History  Problem Relation Age of Onset   CAD Father    Lung cancer Father    CAD Brother     Social History   Socioeconomic History   Marital status: Single    Spouse name: Not on file   Number of children: Not on file   Years of education: Not on file   Highest education level: Not on file  Occupational History   Not on file  Tobacco Use   Smoking status: Never    Passive exposure: Never   Smokeless tobacco: Never  Vaping Use   Vaping status: Never Used  Substance and Sexual Activity   Alcohol use: No   Drug use: No   Sexual activity: Not Currently  Other Topics Concern   Not on file  Social History Narrative   Not on file   Social Drivers of Health   Financial Resource Strain: Low Risk  (03/25/2023)   Overall Financial Resource Strain (CARDIA)    Difficulty of Paying Living Expenses: Not hard at all  Food Insecurity: No Food Insecurity (03/25/2023)    Hunger Vital Sign    Worried About Running Out of Food in the Last Year: Never true    Ran Out of Food in the Last Year: Never true  Transportation Needs: No Transportation Needs (03/25/2023)   PRAPARE - Administrator, Civil Service (Medical): No    Lack of Transportation (Non-Medical): No  Physical Activity: Not on file  Stress: Not on file  Social Connections: Unknown (07/28/2021)   Received from [facility]   Social Network    Social Network: Not on file  Intimate Partner Violence: Not At Risk (03/25/2023)   Humiliation, Afraid, Rape, and Kick questionnaire    Fear of Current or Ex-Partner: No    Emotionally Abused: No    Physically Abused: No    Sexually Abused: No    Review of  Systems  Musculoskeletal:  Positive for joint pain.        Objective    BP 130/72 (Cuff Size: Large)   Pulse 84   Temp 98 F (36.7 C) (Oral)   Resp 18   Ht 5' 7 (1.702 m)   Wt (!) 332 lb 14.4 oz (151 kg)   SpO2 93%   BMI 52.14 kg/m   Physical Exam Constitutional:      Appearance: Normal appearance. He is obese.  HENT:     Head: Normocephalic and atraumatic.   Eyes:     Conjunctiva/sclera: Conjunctivae normal.    Cardiovascular:     Rate and Rhythm: Normal rate and regular rhythm.  Pulmonary:     Effort: Pulmonary effort is normal.     Breath sounds: Normal breath sounds.  Feet:     Right foot:     Skin integrity: Erythema and warmth present.   Skin:    General: Skin is warm and dry.     Comments: Venous stasis dermatitis present in RLE   Neurological:     General: No focal deficit present.     Mental Status: He is alert. Mental status is at baseline.   Psychiatric:        Mood and Affect: Mood normal.        Behavior: Behavior normal.         Assessment & Plan:   Assessment & Plan  Gout Recurrent gout in right big toe. Steroids effective but symptoms recurred. Eliquis  use contraindicates anti-inflammatories. Discussed allopurinol for prevention  post-flare. Provided dietary trigger education. - Prescribe prednisone : 30 mg for 2 days, 20 mg for 2 days, 10 mg for 2 days. - Provide educational materials on dietary triggers. - Consider allopurinol if attacks persist.  Chronic Pain Management/Arthritis Chronic joint pain exacerbated by gout and arthritis. Eliquis  limits pain management options. Discussed Tylenol  and Voltaren gel as alternatives. - Recommend Tylenol  for pain. - Consider Voltaren gel for localized pain. - Weight loss discussed to improve joint pain.  Prediabetes A1c at 5.9%. Discussed steroid impact on blood sugar and importance of monitoring. Emphasized lifestyle modifications. - Recheck A1c in a couple of months.  Weight Management Discussed weight management options. Interested in Zepbound . Explained mechanism, side effects, insurance coverage, and long-term use. Noted average weight loss and pancreatitis risk. Patient states that regarding lifestyle, he is currently eating 2 meals a day (breakfast and lunch) and is staying around 2500 calories a day. He also is participating in regular exercise by walking 30 minutes a day, 3 days a week.  - Prescribe Zepbound . - Instruct to call for appointment in 3-4 weeks if approved and obtained.  Asthma Trelegy inhaler effective. Breztri  not picked up due to insurance. Plan to switch to Trelegy for coverage. - Switch inhaler prescription to Trelegy.  Venous Stasis Dermatitis Venous stasis dermatitis due to fluid pooling. Discussed preventive measures to reduce swelling. - Advise use of compression stockings and leg elevation.   - Fluticasone-Umeclidin-Vilant (TRELEGY ELLIPTA ) 100-62.5-25 MCG/ACT AEPB; Inhale 1 puff into the lungs daily.  Dispense: 1 each; Refill: 11 - tirzepatide  (ZEPBOUND ) 2.5 MG/0.5ML Pen; Inject 2.5 mg into the skin once a week.  Dispense: 2 mL; Refill: 0 - predniSONE  (DELTASONE ) 10 MG tablet; Take 3 tablets (30 mg total) by mouth daily with breakfast for  2 days, THEN 2 tablets (20 mg total) daily with breakfast for 2 days, THEN 1 tablet (10 mg total) daily with breakfast for 2 days.  Dispense: 12  tablet; Refill: 0    Return in about 6 months (around 03/15/2024).   Sharyle Fischer, DO

## 2023-09-14 ENCOUNTER — Other Ambulatory Visit (HOSPITAL_COMMUNITY): Payer: Self-pay

## 2023-09-14 ENCOUNTER — Telehealth: Payer: Self-pay | Admitting: Pharmacy Technician

## 2023-09-14 NOTE — Telephone Encounter (Signed)
 Prior Authorization form/request asks a question that requires your assistance. Please see the question below and advise accordingly. The PA will not be submitted until the necessary information is received.   Oh my goodness these plans are requiring more and more everyday all kinds of information for these GLP's and unfortunately without the information they are asking for we are seeing more denials. Below is what Sean Garza insurance plan is requesting. Dr Bernardo may make an addendum to his office visit to include the info:

## 2023-09-15 ENCOUNTER — Other Ambulatory Visit (HOSPITAL_COMMUNITY): Payer: Self-pay

## 2023-09-21 NOTE — Telephone Encounter (Signed)
 FYI

## 2023-09-30 ENCOUNTER — Telehealth: Payer: Self-pay | Admitting: Internal Medicine

## 2023-09-30 NOTE — Telephone Encounter (Signed)
 Copied from CRM [id number]. Topic: Clinical - Medication Question >> Sep 30, 2023 11:00 AM Deleta RAMAN wrote: Reason for CRM: patient is calling to follow up about zepbound  medication request he would like to know the process and status. You can reach him [phone]. Patient also mentioned he consume about 2500 calories a day. He does not have physical exercise routine just lifting at work and walking sometimes.

## 2023-10-03 NOTE — Telephone Encounter (Signed)
 Good afternoon Sherrilyn, I sent a message back to the office on 09/14/23 needing additional information and it look like on 09/22/23 Sharyle asked you to contact Mr. Szumski for more information.  As soon as I have the information that is plan is asking for I can submit the PA.  Thanks

## 2023-10-03 NOTE — Telephone Encounter (Signed)
 Any response to this PA yet

## 2023-10-03 NOTE — Telephone Encounter (Signed)
 Order sent to [facility] for PA

## 2023-10-04 NOTE — Telephone Encounter (Signed)
 Patients states he only eats breakfast and lunch and consumes probably about 2500 calories a day?  And walks 3 days a week for about 

## 2023-10-05 ENCOUNTER — Ambulatory Visit: Admitting: Urology

## 2023-10-05 ENCOUNTER — Other Ambulatory Visit (HOSPITAL_COMMUNITY): Payer: Self-pay

## 2023-10-05 NOTE — Telephone Encounter (Signed)
 Pharmacy Patient Advocate Encounter   Received notification from CoverMyMeds that prior authorization for Zepbound  2.5MG /0.5ML pen-injectors is required/requested.   Insurance verification completed.   The patient is insured through [facility] .   Per test claim: PA required; PA submitted to above mentioned insurance via CoverMyMeds Key/confirmation #/EOC [id number] Status is pending

## 2023-10-06 ENCOUNTER — Other Ambulatory Visit (HOSPITAL_COMMUNITY): Payer: Self-pay

## 2023-10-06 NOTE — Telephone Encounter (Signed)
 Yes please addend note and re sent to me, so I can forward to PA team

## 2023-10-06 NOTE — Telephone Encounter (Signed)
 Pharmacy Patient Advocate Encounter  Received notification from [employer] that Prior Authorization for Zepbound  2.5MG /0.5ML pen-injectors has been DENIED.  No reason given; No denial letter received via Fax or CMM. It has been requested and will be uploaded to the media tab once received.   PA #/Case ID/Reference #: [id number]

## 2023-10-12 NOTE — Telephone Encounter (Signed)
 Good afternoon, that information was added when the PA was submitted and it was denied 10/06/23, denial letter was uploaded to pt's media tab.

## 2023-10-17 ENCOUNTER — Ambulatory Visit: Admitting: Urology

## 2023-10-20 ENCOUNTER — Encounter: Payer: Self-pay | Admitting: Urology

## 2023-12-07 ENCOUNTER — Ambulatory Visit
Admission: RE | Admit: 2023-12-07 | Discharge: 2023-12-07 | Disposition: A | Discharge status: Home / Self Care | Source: Ambulatory Visit | Attending: Internal Medicine | Admitting: Internal Medicine

## 2023-12-07 ENCOUNTER — Encounter: Payer: Self-pay | Admitting: Internal Medicine

## 2023-12-07 ENCOUNTER — Ambulatory Visit (INDEPENDENT_AMBULATORY_CARE_PROVIDER_SITE_OTHER): Admitting: Internal Medicine

## 2023-12-07 VITALS — BP 130/80 | HR 73 | Temp 98.2°F | Ht 68.0 in | Wt 338.8 lb

## 2023-12-07 DIAGNOSIS — J4520 Mild intermittent asthma, uncomplicated: Secondary | ICD-10-CM

## 2023-12-07 DIAGNOSIS — G4733 Obstructive sleep apnea (adult) (pediatric): Secondary | ICD-10-CM | POA: Diagnosis not present

## 2023-12-07 DIAGNOSIS — R0602 Shortness of breath: Secondary | ICD-10-CM | POA: Diagnosis not present

## 2023-12-07 NOTE — Progress Notes (Signed)
 Name: Sean Garza MRN: [id number] DOB: Jan 14, 1961    CHIEF COMPLAINT:  ASSESSMENT OF SLEEP APNEA Assessment of asthma Diagnosis of DVT   HISTORY OF PRESENT ILLNESS: Patient is seen today for problems and issues with sleep related to excessive daytime sleepiness Patient  has been having sleep problems for many years Patient has been having excessive daytime sleepiness for a long time Patient has been having extreme fatigue and tiredness, lack of energy +  very Loud snoring every night + struggling breathe at night and gasps for air + morning headaches + Nonrefreshing sleep  Discussed sleep data and reviewed with patient.  Encouraged proper weight management.  Discussed driving precautions and its relationship with hypersomnolence.  Discussed operating dangerous equipment and its relationship with hypersomnolence.  Discussed sleep hygiene, and benefits of a fixed sleep waked time.  The importance of getting eight or more hours of sleep discussed with patient.  Discussed limiting the use of the computer and television before bedtime.  Decrease naps during the day, so night time sleep will become enhanced.  Limit caffeine, and sleep deprivation.  HTN, stroke, and heart failure are potential risk factors.   30 years ago patient was diagnosed with sleep apnea patient underwent CPAP therapy however did not tolerated Patient underwent laryngeal surgery at that time which did not seem to help Patient currently weighs 330 pounds  Patient with history of COVID in 2021 was hospitalized for 2 weeks Was out of work for 5-1/2 months Ever since then patient has had increased work of breathing shortness of breath placed on Trelegy inhaler It seems to be helping however will increase the dose Patient is a non-smoker however had significant secondhand smoke exposure from parents Father died of lung cancer Patient was diagnosed with childhood asthma He remembers taking albuterol  as needed  but no hospitalizations Patient did have exercise-induced bronchospasms however he does not recall any triggers  No exacerbation at this time No evidence of heart failure at this time No evidence or signs of infection at this time No respiratory distress No fevers, chills, nausea, vomiting, diarrhea No evidence of lower extremity edema No evidence hemoptysis  Patient with history of DVT right leg due to surgery which was provoked Patient history with a left leg DVT unprovoked Currently on anticoagulation Followed by Dr. Babara with hematology    12/07/2023    9:00 AM  Results of the Epworth flowsheet  Sitting and reading 3  Watching TV 3  Sitting, inactive in a public place (e.g. a theatre or a meeting) 0  As a passenger in a car for an hour without a break 1  Lying down to rest in the afternoon when circumstances permit 2  Sitting and talking to someone 0  Sitting quietly after a lunch without alcohol 1  In a car, while stopped for a few minutes in traffic 0  Total score 10      PAST MEDICAL HISTORY :   has a past medical history of Asthma, Collagen vascular disease, GERD (gastroesophageal reflux disease), Gout, blood clots, Hypertension, and Sleep apnea.  has a past surgical history that includes Achilles tendon repair; Eye surgery; Colonoscopy with propofol  (N/A, 12/13/2016); Uvulopalatopharyngoplasty; and Shoulder arthroscopy with open rotator cuff repair (Left, 03/24/2017). Prior to Admission medications   Medication Sig Start Date End Date Taking? Authorizing Provider  acetaminophen  (TYLENOL ) 500 MG tablet Take 2 tablets (1,000 mg total) by mouth every 6 (six) hours as needed. 08/21/23 08/20/24  Clarine Ozell LABOR, MD  albuterol  (VENTOLIN  HFA) 108 (90 Base) MCG/ACT inhaler Inhale 2 puffs into the lungs every 6 (six) hours as needed for wheezing or shortness of breath. 08/12/23   Bernardo Fend, DO  apixaban  (ELIQUIS ) 2.5 MG TABS tablet Take 1 tablet (2.5 mg total) by mouth 2 (two)  times daily. 07/01/23   Babara Call, MD  Ascorbic Acid  (VITAMIN C) 1000 MG tablet Take 1,000 mg by mouth daily.    [provider]  cholecalciferol (VITAMIN D3) 25 MCG (1000 UNIT) tablet Take 1,000 Units by mouth daily.    [provider]  Fluticasone-Umeclidin-Vilant (TRELEGY ELLIPTA ) 100-62.5-25 MCG/ACT AEPB Inhale 1 puff into the lungs daily. 09/13/23   Bernardo Fend, DO  pantoprazole  (PROTONIX ) 20 MG tablet Take 1 tablet (20 mg total) by mouth daily as needed for heartburn. 08/12/23   Bernardo Fend, DO  sildenafil (REVATIO) 20 MG tablet Take 20 mg by mouth daily. As needed    [provider]  tirzepatide  (ZEPBOUND ) 2.5 MG/0.5ML Pen Inject 2.5 mg into the skin once a week. 09/13/23   Bernardo Fend, DO  zinc  gluconate 50 MG tablet Take 50 mg by mouth daily.    [provider]   Allergies  Allergen Reactions   Penicillins Other (See Comments)    Childhood reaction. Has patient had a PCN reaction causing immediate rash, facial/tongue/throat swelling, SOB or lightheadedness with hypotension: Unknown Has patient had a PCN reaction causing severe rash involving mucus membranes or skin necrosis: Unknown Has patient had a PCN reaction that required hospitalization: No Has patient had a PCN reaction occurring within the last 10 years: No If all of the above answers are NO, then may proceed with Cephalosporin use.     FAMILY HISTORY:  family history includes CAD in his brother and father; Lung cancer in his father. SOCIAL HISTORY:  reports that he has never smoked. He has never been exposed to tobacco smoke. He has never used smokeless tobacco. He reports that he does not drink alcohol and does not use drugs.   BP 130/80   Pulse 73   Temp 98.2 F (36.8 C)   Ht 5' 8 (1.727 m)   Wt (!) 338 lb 12.8 oz (153.7 kg)   SpO2 95%   BMI 51.51 kg/m    Review of Systems: Gen:  Denies  fever, sweats, chills weight loss  HEENT: Denies blurred vision,  double vision, ear pain, eye pain, hearing loss, nose bleeds, sore throat Cardiac:  No dizziness, chest pain or heaviness, chest tightness,edema, No JVD Resp:   No cough, -sputum production, -shortness of breath,-wheezing, -hemoptysis,  Other:  All other systems negative   Physical Examination:   General Appearance: No distress  EYES PERRLA, EOM intact.   NECK Supple, No JVD Pulmonary: normal breath sounds, No wheezing.  CardiovascularNormal S1,S2.  No m/r/g.   Abdomen: Benign, Soft, non-tender. Neurology UE/LE 5/5 strength, no focal deficits Ext pulses intact, cap refill intact ALL OTHER ROS ARE NEGATIVE     ASSESSMENT AND PLAN SYNOPSIS  63 year old pleasant white male morbidly obese with deconditioned state with signs and symptoms of obstructive sleep apnea in the setting of mild to moderate persistent asthma which has worsened since his COVID diagnosis with underlying left leg DVT  Assessment of OSA Recommend repeat home sleep study We will initiate therapy once results are completed  Assessment of asthma Mild to moderate Will increase Trelegy to 200 1 puff once a day Rinse mouth after use Albuterol  as needed Avoid Allergens and Irritants  Avoid secondhand smoke Avoid SICK contacts Recommend  Masking  when appropriate Recommend Keep up-to-date with vaccinations No exacerbation at this time No evidence of heart failure at this time No evidence or signs of infection at this time No respiratory distress No fevers, chills, nausea, vomiting, diarrhea No evidence of lower extremity edema No evidence hemoptysis   History of DVT unprovoked Previous history of COVID It seems that patient will be on anticoagulation therapy indefinitely Follow-up with hematology   Obesity -recommend significant weight loss -recommend changing diet  Deconditioned state -Recommend increased daily activity and exercise   MEDICATION ADJUSTMENTS/LABS AND TESTS ORDERED: Repeat home  sleep study Check pulmonary function testing Increase to Trelegy 200 Rinse mouth after use Weight loss Avoid Allergens and Irritants Avoid secondhand smoke Avoid SICK contacts Recommend  Masking  when appropriate Recommend Keep up-to-date with vaccinations   CURRENT MEDICATIONS REVIEWED AT LENGTH WITH PATIENT TODAY   Patient  satisfied with Plan of action and management. All questions answered   Follow up    I spent a total of 71 minutes dedicated to the care of this patient on the date of this encounter to include pre-visit review of records, face-to-face time with the patient discussing conditions above, post visit ordering of testing, clinical documentation with the electronic health record, making appropriate referrals as documented, and communicating necessary information to the patient's healthcare team.     Nickolas Alm Cellar, M.D.  [facility]  Medical Director [facility] Medical Director [facility] Cardio-Pulmonary Department

## 2023-12-07 NOTE — Patient Instructions (Signed)
 Recommend home sleep study to assess for sleep apnea Recommend chest x-ray to assess lung Recommend pulmonary function testing to assess your breathing  Lets plan to increase your Trelegy to 200, 1 puff once a day Please rinse mouth after use  Recommend weight loss  Avoid Allergens and Irritants Avoid secondhand smoke Avoid SICK contacts Recommend  Masking  when appropriate Recommend Keep up-to-date with vaccinations

## 2023-12-13 ENCOUNTER — Ambulatory Visit: Payer: Self-pay | Admitting: Internal Medicine

## 2023-12-13 DIAGNOSIS — G4733 Obstructive sleep apnea (adult) (pediatric): Secondary | ICD-10-CM

## 2023-12-15 ENCOUNTER — Encounter

## 2023-12-15 DIAGNOSIS — G4733 Obstructive sleep apnea (adult) (pediatric): Secondary | ICD-10-CM

## 2023-12-26 DIAGNOSIS — G4733 Obstructive sleep apnea (adult) (pediatric): Secondary | ICD-10-CM | POA: Diagnosis not present

## 2023-12-30 ENCOUNTER — Ambulatory Visit: Admitting: Oncology

## 2023-12-30 ENCOUNTER — Other Ambulatory Visit

## 2024-01-13 ENCOUNTER — Other Ambulatory Visit (HOSPITAL_COMMUNITY): Payer: Self-pay

## 2024-01-13 ENCOUNTER — Telehealth: Payer: Self-pay

## 2024-01-13 NOTE — Telephone Encounter (Signed)
   Good morning Sean Garza above is from the denial letter, is there any documentation that I can send regarding active participation for atleast 3 months prior in any lifestyle modification program regarding his weight.  The sleep study will be sent BUT we also need the above.  Thanks!

## 2024-01-13 NOTE — Telephone Encounter (Signed)
 FYI

## 2024-01-13 NOTE — Telephone Encounter (Signed)
 Copied from CRM [id number]. Topic: General - Other >> Jan 12, 2024  4:20 PM Santiya F wrote: Reason for CRM: Patient is calling in because he has been diagnosed with severe sleep apnea and is now using a CPAP machine and wanted to let his provider know. Patient also wants to know the status of his Zepbound .

## 2024-01-13 NOTE — Telephone Encounter (Signed)
 Can we try again w/ sleep apnea as DX? Sleep study under media tab    Copied from CRM #[id number]. Topic: General - Other >> Jan 12, 2024  4:20 PM Santiya F wrote: Reason for CRM: Patient is calling in because he has been diagnosed with severe sleep apnea and is now using a CPAP machine and wanted to let his provider know. Patient also wants to know the status of his Zepbound .

## 2024-01-17 NOTE — Telephone Encounter (Signed)
 Appt scheduled

## 2024-01-30 DIAGNOSIS — G4733 Obstructive sleep apnea (adult) (pediatric): Secondary | ICD-10-CM | POA: Diagnosis not present

## 2024-01-31 ENCOUNTER — Encounter: Payer: Self-pay | Admitting: Internal Medicine

## 2024-01-31 ENCOUNTER — Other Ambulatory Visit: Payer: Self-pay

## 2024-01-31 ENCOUNTER — Ambulatory Visit: Admitting: Internal Medicine

## 2024-01-31 VITALS — BP 122/80 | HR 88 | Temp 97.8°F | Resp 18 | Ht 68.0 in | Wt 336.8 lb

## 2024-01-31 DIAGNOSIS — E66813 Obesity, class 3: Secondary | ICD-10-CM

## 2024-01-31 DIAGNOSIS — Z23 Encounter for immunization: Secondary | ICD-10-CM | POA: Diagnosis not present

## 2024-01-31 DIAGNOSIS — M1A0610 Idiopathic chronic gout, right knee, without tophus (tophi): Secondary | ICD-10-CM

## 2024-01-31 DIAGNOSIS — J4520 Mild intermittent asthma, uncomplicated: Secondary | ICD-10-CM

## 2024-01-31 DIAGNOSIS — Z6841 Body Mass Index (BMI) 40.0 and over, adult: Secondary | ICD-10-CM

## 2024-01-31 DIAGNOSIS — K219 Gastro-esophageal reflux disease without esophagitis: Secondary | ICD-10-CM

## 2024-01-31 DIAGNOSIS — N529 Male erectile dysfunction, unspecified: Secondary | ICD-10-CM

## 2024-01-31 DIAGNOSIS — G4733 Obstructive sleep apnea (adult) (pediatric): Secondary | ICD-10-CM | POA: Diagnosis not present

## 2024-01-31 DIAGNOSIS — R7303 Prediabetes: Secondary | ICD-10-CM

## 2024-01-31 LAB — POCT GLYCOSYLATED HEMOGLOBIN (HGB A1C): Hemoglobin A1C: 5.7 % — AB (ref 4.0–5.6)

## 2024-01-31 MED ORDER — PREDNISONE 10 MG PO TABS: ORAL_TABLET | ORAL | 0 refills | Status: AC

## 2024-01-31 MED ORDER — ALBUTEROL SULFATE HFA 108 (90 BASE) MCG/ACT IN AERS: 2.0000 | INHALATION_SPRAY | Freq: Four times a day (QID) | RESPIRATORY_TRACT | 1 refills | Status: AC | PRN

## 2024-01-31 MED ORDER — PANTOPRAZOLE SODIUM 20 MG PO TBEC: 20.0000 mg | DELAYED_RELEASE_TABLET | Freq: Every day | ORAL | 0 refills | Status: AC | PRN

## 2024-01-31 MED ORDER — SILDENAFIL CITRATE 20 MG PO TABS
20.0000 mg | ORAL_TABLET | Freq: Every day | ORAL | 2 refills | Status: AC
Start: 2024-01-31 — End: ?

## 2024-01-31 MED ORDER — ALLOPURINOL 100 MG PO TABS: 100.0000 mg | ORAL_TABLET | Freq: Every day | ORAL | 1 refills | Status: AC

## 2024-01-31 NOTE — Progress Notes (Signed)
 Established Patient Office Visit  Subjective    Patient ID: Sean Garza, male    DOB: 11-06-1960  Age: 63 y.o. MRN: [id number]  CC:  Chief Complaint  Patient presents with   Obesity    Discuss weight loss options    HPI Sean Garza presents for follow up from last visit.   Discussed the use of AI scribe software for clinical note transcription with the patient, who gave verbal consent to proceed.  History of Present Illness  Sean Garza is a 63 year old male with obesity, prediabetes, and sleep apnea who presents for a follow-up regarding weight management and medication authorization.  He is experiencing issues with insurance coverage for a weight loss medication, which requires participation in a structured weight loss program for approval. Despite previous documentation attempts, insurance has denied medication for weight management.  He has obesity, prediabetes, and sleep apnea, managed with a CPAP machine. He is currently experiencing a gout flare in his right knee, which began on Sunday. This is his third gout flare this year, with the last one in July in his left leg. He is taking Eliquis  and has used prednisone  for gout flares in the past.  His medications include Eliquis , Trelegy, Protonix , and an albuterol  inhaler, which he finds less effective than previous ones. He takes Tylenol  daily but finds it less effective than naproxen for pain relief. His last A1c was checked in May.  He is also needing ED medication refilled today.    Hypertension/OSA: -Medications: had been on hydrochlorothiazide 25 mg but discontinued after the first gout attack, not on anything now -Denies any SOB, CP, vision changes, LE edema or symptoms of hypotension -Diagnosed with severe OSA, now following with Pulmonology and on a CPAP  Hx of DVT: -Hx of original provoked DVT in RLE, then unprovoked in LLE -Currently on Eliquis  2.5 mg BID  -Following with Hematology   Asthma:   -Asthma status: better -Current Treatments: Trelegy, Albuterol  PRN  -Satisfied with current treatment?: yes -Visits to ER or Urgent Care in past year: no -Pneumovax: Up to Date -Influenza: Up to Date -Hx of ICU admission for COVID-19 PNA in 2021 (refuses COVID vaccines)  GERD: -Currently on Nexium 20 mg OTC PRN  Health Maintenance: -Blood work UTD    Outpatient Encounter Medications as of 01/31/2024  Medication Sig   acetaminophen  (TYLENOL ) 500 MG tablet Take 2 tablets (1,000 mg total) by mouth every 6 (six) hours as needed.   albuterol  (VENTOLIN  HFA) 108 (90 Base) MCG/ACT inhaler Inhale 2 puffs into the lungs every 6 (six) hours as needed for wheezing or shortness of breath.   apixaban  (ELIQUIS ) 2.5 MG TABS tablet Take 1 tablet (2.5 mg total) by mouth 2 (two) times daily.   Ascorbic Acid  (VITAMIN C) 1000 MG tablet Take 1,000 mg by mouth daily.   cholecalciferol (VITAMIN D3) 25 MCG (1000 UNIT) tablet Take 1,000 Units by mouth daily.   Fluticasone-Umeclidin-Vilant (TRELEGY ELLIPTA ) 100-62.5-25 MCG/ACT AEPB Inhale 1 puff into the lungs daily.   pantoprazole  (PROTONIX ) 20 MG tablet Take 1 tablet (20 mg total) by mouth daily as needed for heartburn.   sildenafil (REVATIO) 20 MG tablet Take 20 mg by mouth daily. As needed   zinc  gluconate 50 MG tablet Take 50 mg by mouth daily.   tirzepatide  (ZEPBOUND ) 2.5 MG/0.5ML Pen Inject 2.5 mg into the skin once a week. (Patient not taking: Reported on 01/31/2024)   No facility-administered encounter medications on file as of  01/31/2024.    Past Medical History:  Diagnosis Date   Asthma    Collagen vascular disease    Rhematoid Arthritis   GERD (gastroesophageal reflux disease)    Gout    Hx of blood clots    Hypertension    Sleep apnea     Past Surgical History:  Procedure Laterality Date   ACHILLES TENDON REPAIR     COLONOSCOPY WITH PROPOFOL  N/A 12/13/2016   Procedure: COLONOSCOPY WITH PROPOFOL ;  Surgeon: Viktoria Lamar DASEN, MD;   Location: [facility] ENDOSCOPY;  Service: Endoscopy;  Laterality: N/A;   EYE SURGERY     floppy eye syndrome surgery   SHOULDER ARTHROSCOPY WITH OPEN ROTATOR CUFF REPAIR Left 03/24/2017   Procedure: SHOULDER ARTHROSCOPY WITH OPEN ROTATOR CUFF REPAIR;  Surgeon: Edie Norleen PARAS, MD;  Location: [facility] ORS;  Service: Orthopedics;  Laterality: Left;   UVULOPALATOPHARYNGOPLASTY      Family History  Problem Relation Age of Onset   CAD Father    Lung cancer Father    CAD Brother     Social History   Socioeconomic History   Marital status: Single    Spouse name: Not on file   Number of children: Not on file   Years of education: Not on file   Highest education level: Not on file  Occupational History   Not on file  Tobacco Use   Smoking status: Never    Passive exposure: Never   Smokeless tobacco: Never  Vaping Use   Vaping status: Never Used  Substance and Sexual Activity   Alcohol use: No   Drug use: No   Sexual activity: Not Currently  Other Topics Concern   Not on file  Social History Narrative   Not on file   Social Drivers of Health   Financial Resource Strain: Low Risk  (03/25/2023)   Overall Financial Resource Strain (CARDIA)    Difficulty of Paying Living Expenses: Not hard at all  Food Insecurity: No Food Insecurity (03/25/2023)   Hunger Vital Sign    Worried About Running Out of Food in the Last Year: Never true    Ran Out of Food in the Last Year: Never true  Transportation Needs: No Transportation Needs (03/25/2023)   PRAPARE - Administrator, Civil Service (Medical): No    Lack of Transportation (Non-Medical): No  Physical Activity: Not on file  Stress: Not on file  Social Connections: Not on file  Intimate Partner Violence: Not At Risk (03/25/2023)   Humiliation, Afraid, Rape, and Kick questionnaire    Fear of Current or Ex-Partner: No    Emotionally Abused: No    Physically Abused: No    Sexually Abused: No    Review of Systems  Musculoskeletal:   Positive for joint pain.        Objective    BP 122/80 (Cuff Size: Large)   Pulse 88   Temp 97.8 F (36.6 C) (Oral)   Resp 18   Ht 5' 8 (1.727 m)   Wt (!) 336 lb 12.8 oz (152.8 kg)   SpO2 94%   BMI 51.21 kg/m   Physical Exam Constitutional:      Appearance: Normal appearance. He is obese.  HENT:     Head: Normocephalic and atraumatic.  Eyes:     Conjunctiva/sclera: Conjunctivae normal.  Cardiovascular:     Rate and Rhythm: Normal rate and regular rhythm.  Pulmonary:     Effort: Pulmonary effort is normal.     Breath sounds:  Normal breath sounds.  Musculoskeletal:     Right knee: Swelling present. Tenderness present.  Feet:     Right foot:     Skin integrity: Erythema and warmth present.  Skin:    General: Skin is warm and dry.  Neurological:     General: No focal deficit present.     Mental Status: He is alert. Mental status is at baseline.  Psychiatric:        Mood and Affect: Mood normal.        Behavior: Behavior normal.         Assessment & Plan:   Assessment & Plan Obesity, class 3 Insurance requires documented structured weight loss program before medication approval. Previous medication attempts denied due to lack of documentation. - Referred to a nutritionist for a structured weight loss program. - Documented participation in the program for three months. - Will reassess insurance approval for medication in three months.  Gout, right knee Acute gout flare in right knee, third flare this year. NSAIDs contraindicated due to Eliquis . Discussed allopurinol for prevention post-flare. - Prescribed prednisone  taper: 30 mg for 2 days, 20 mg for 2 days, 10 mg for 2 days. - Prescribed allopurinol for daily use after acute flare resolves. - Advised to monitor for chest pain while taking prednisone .  Prediabetes Monitoring A1c levels to assess glucose control. Last A1c check in May. - Performed finger stick A1c test today which was stable at 5.7%. -  Will send results via MyChart.  Gastro-esophageal reflux disease Requires ongoing management with Protonix . - Prescribed Protonix .  Mild intermittent asthma Requires new rescue inhaler. Previous inhaler ineffective. - Prescribed albuterol  Ventolin  HFA inhaler.  Male erectile dysfunction Discussed cost-saving options with GoodRx. - Ensure GoodRx card is available for cost savings.  - Flu vaccine trivalent PF, 6mos and older(Flulaval,Afluria,Fluarix,Fluzone) - Referral to Nutrition and Diabetes Services - POCT HgB A1C - predniSONE  (DELTASONE ) 10 MG tablet; Take 3 tablets (30 mg total) by mouth daily with breakfast for 2 days, THEN 2 tablets (20 mg total) daily with breakfast for 2 days, THEN 1 tablet (10 mg total) daily with breakfast for 2 days.  Dispense: 12 tablet; Refill: 0 - allopurinol (ZYLOPRIM) 100 MG tablet; Take 1 tablet (100 mg total) by mouth daily.  Dispense: 90 tablet; Refill: 1 - albuterol  (VENTOLIN  HFA) 108 (90 Base) MCG/ACT inhaler; Inhale 2 puffs into the lungs every 6 (six) hours as needed for wheezing or shortness of breath.  Dispense: 8 g; Refill: 1 - pantoprazole  (PROTONIX ) 20 MG tablet; Take 1 tablet (20 mg total) by mouth daily as needed for heartburn.  Dispense: 90 tablet; Refill: 0 - sildenafil (REVATIO) 20 MG tablet; Take 1 tablet (20 mg total) by mouth daily. As needed  Dispense: 10 tablet; Refill: 2    Return in about 3 months (around 05/02/2024).   Sharyle Fischer, DO

## 2024-02-01 ENCOUNTER — Other Ambulatory Visit (HOSPITAL_COMMUNITY): Payer: Self-pay

## 2024-02-01 ENCOUNTER — Telehealth: Payer: Self-pay | Admitting: Pharmacy Technician

## 2024-02-01 NOTE — Telephone Encounter (Signed)
 Pharmacy Patient Advocate Encounter   Received notification from CoverMyMeds that prior authorization for Sildenafil Citrate (PAH) 20MG  tablets is required/requested.  Insurance verification completed.   The patient is insured through [facility].   Per test claim: PA required; PA submitted to above mentioned insurance via CoverMyMeds Key/confirmation #/EOC [id number] Status is pending

## 2024-02-02 NOTE — Telephone Encounter (Signed)
 Pharmacy Patient Advocate Encounter  Received notification from [employer] that Prior Authorization for Sildenafil Citrate (PAH) 20MG  tablets  has been DENIED.  Full denial letter will be uploaded to the media tab. See denial reason below.   PA #/Case ID/Reference #: [id number]

## 2024-02-08 ENCOUNTER — Inpatient Hospital Stay: Admitting: Oncology

## 2024-02-08 ENCOUNTER — Inpatient Hospital Stay: Attending: Oncology

## 2024-02-08 ENCOUNTER — Encounter: Payer: Self-pay | Admitting: Oncology

## 2024-02-08 VITALS — BP 132/85 | HR 73 | Temp 97.2°F | Resp 18 | Wt 331.4 lb

## 2024-02-08 DIAGNOSIS — I824Y2 Acute embolism and thrombosis of unspecified deep veins of left proximal lower extremity: Secondary | ICD-10-CM

## 2024-02-08 DIAGNOSIS — G8929 Other chronic pain: Secondary | ICD-10-CM | POA: Insufficient documentation

## 2024-02-08 DIAGNOSIS — Z801 Family history of malignant neoplasm of trachea, bronchus and lung: Secondary | ICD-10-CM | POA: Diagnosis not present

## 2024-02-08 DIAGNOSIS — Z86718 Personal history of other venous thrombosis and embolism: Secondary | ICD-10-CM | POA: Insufficient documentation

## 2024-02-08 DIAGNOSIS — M7989 Other specified soft tissue disorders: Secondary | ICD-10-CM

## 2024-02-08 DIAGNOSIS — I87009 Postthrombotic syndrome without complications of unspecified extremity: Secondary | ICD-10-CM | POA: Insufficient documentation

## 2024-02-08 DIAGNOSIS — Z79899 Other long term (current) drug therapy: Secondary | ICD-10-CM | POA: Diagnosis not present

## 2024-02-08 DIAGNOSIS — M109 Gout, unspecified: Secondary | ICD-10-CM | POA: Diagnosis not present

## 2024-02-08 DIAGNOSIS — Z880 Allergy status to penicillin: Secondary | ICD-10-CM | POA: Insufficient documentation

## 2024-02-08 DIAGNOSIS — Z8249 Family history of ischemic heart disease and other diseases of the circulatory system: Secondary | ICD-10-CM | POA: Insufficient documentation

## 2024-02-08 DIAGNOSIS — Z7901 Long term (current) use of anticoagulants: Secondary | ICD-10-CM | POA: Diagnosis not present

## 2024-02-08 LAB — CBC WITH DIFFERENTIAL (CANCER CENTER ONLY)
Abs Immature Granulocytes: 0.06 K/uL (ref 0.00–0.07)
Basophils Absolute: 0.1 K/uL (ref 0.0–0.1)
Basophils Relative: 1 %
Eosinophils Absolute: 0.2 K/uL (ref 0.0–0.5)
Eosinophils Relative: 3 %
HCT: 41.8 % (ref 39.0–52.0)
Hemoglobin: 14 g/dL (ref 13.0–17.0)
Immature Granulocytes: 1 %
Lymphocytes Relative: 19 %
Lymphs Abs: 1.5 K/uL (ref 0.7–4.0)
MCH: 29.2 pg (ref 26.0–34.0)
MCHC: 33.5 g/dL (ref 30.0–36.0)
MCV: 87.3 fL (ref 80.0–100.0)
Monocytes Absolute: 0.8 K/uL (ref 0.1–1.0)
Monocytes Relative: 10 %
Neutro Abs: 5.4 K/uL (ref 1.7–7.7)
Neutrophils Relative %: 66 %
Platelet Count: 255 K/uL (ref 150–400)
RBC: 4.79 MIL/uL (ref 4.22–5.81)
RDW: 14.8 % (ref 11.5–15.5)
WBC Count: 8 K/uL (ref 4.0–10.5)
nRBC: 0 % (ref 0.0–0.2)

## 2024-02-08 LAB — CMP (CANCER CENTER ONLY)
ALT: 21 U/L (ref 0–44)
AST: 19 U/L (ref 15–41)
Albumin: 3.6 g/dL (ref 3.5–5.0)
Alkaline Phosphatase: 65 U/L (ref 38–126)
Anion gap: 9 (ref 5–15)
BUN: 17 mg/dL (ref 8–23)
CO2: 22 mmol/L (ref 22–32)
Calcium: 8.8 mg/dL — ABNORMAL LOW (ref 8.9–10.3)
Chloride: 105 mmol/L (ref 98–111)
Creatinine: 0.99 mg/dL (ref 0.61–1.24)
GFR, Estimated: >60 mL/min (ref >60)
Glucose, Bld: 92 mg/dL (ref 70–99)
Potassium: 4 mmol/L (ref 3.5–5.1)
Sodium: 136 mmol/L (ref 135–145)
Total Bilirubin: 1.1 mg/dL (ref 0.0–1.2)
Total Protein: 7.9 g/dL (ref 6.5–8.1)

## 2024-02-08 MED ORDER — APIXABAN 2.5 MG PO TABS: 2.5000 mg | ORAL_TABLET | Freq: Two times a day (BID) | ORAL | 6 refills | Status: AC

## 2024-02-08 NOTE — Assessment & Plan Note (Addendum)
 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 Previous history of provoked DVT in the right lower extremity post-surgery in 2007.  Hypercoagulable workup showed negative factor V Leiden mutation, negative prothrombin gene mutation, normal protein C and S, no lupus anticoagulant.  Negative beta glycoprotein antibodies, low anticardiolipin IgM, less than 40, not clinically significant.  Recommend patient to decrease to Eliquis  2.5mg  twice daily.

## 2024-02-08 NOTE — Progress Notes (Signed)
 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 Previous history of provoked DVT in the right lower extremity post-surgery in 2007.  Hypercoagulable workup showed negative factor V Leiden mutation, negative prothrombin gene mutation, normal protein C and S, no lupus anticoagulant.  Negative beta glycoprotein antibodies, low anticardiolipin IgM, less than 40, not clinically significant.  Recommend patient to decrease to Eliquis  2.5mg  twice daily.   Post-thrombotic syndrome Recommend leg elevation, wearing compression stocking. Left lower extremity cramp and swelling, check venous ultrasound to rule out recurrent DVT.    Orders Placed This Encounter  Procedures   US  Venous Img Lower Unilateral Left    Standing Status:   Future    Expected Date:   02/13/2024    Expiration Date:   02/07/2025    Reason for Exam (SYMPTOM  OR DIAGNOSIS REQUIRED):   leg swelling    Preferred imaging location?:   [facility]   CMP (Cancer Center only)    Standing Status:   Future    Expected Date:   08/07/2024    Expiration Date:   11/05/2024   CBC with Differential (Cancer Center Only)    Standing Status:   Future    Expected Date:   08/07/2024    Expiration Date:   11/05/2024   Follow up in 6 months  All questions were answered. The patient knows to call the clinic with any problems, questions or concerns.  Zelphia Cap, MD, PhD [facility] Hematology Oncology 02/08/2024   HISTORY OF PRESENTING ILLNESS:   Sean Garza is a  63 y.o.  male with PMH listed below was seen in consultation at the request of  Corlis Honor BROCKS, MD  for  evaluation of lower extremity DVT  He presented with pain in the left lower extremity, initially mistaken for gout. The discomfort was localized to the knee and ankle, preventing weight-bearing. The patient sought medical attention, leading to the discovery of multiple blood clots extending from the back of the knee to the ankle. 02/13/2023 LLE venous ultrasound showed  Nonocclusive thrombus in the left popliteal vein and left posterior tibial vein. Probable occlusive thrombus in the visualized left peroneal vein.   blood clots were unprovoked, unlike the previous episode which was attributed to post-surgical immobility. The patient reported no recent surgeries, long-distance travel, or other potential triggers.  The patient was prescribed Eliquis , which he tolerated well without any bleeding complications. The medication alleviated some of the pain, but residual discomfort persisted, particularly in the ankle.  This was not the patient's first encounter with blood clots. In 2007, he developed clots in the right leg following Achilles tendon surgery. At that time, he was treated with Lovenox  injections bridging to Coumadin for several months.   The patient also reported chronic ankle pain, likely due to degenerative changes.  The patient's family history was notable for an aunt who died from a blood clot, and a father who died from lung cancer. The patient had no personal history of cancer, and his most recent colonoscopy in 2018 showed some polyps.   INTERVAL HISTORY Sean Garza is a 63 y.o. male who has above history reviewed  by me today presents for follow up visit for history of lower extremity DVT. Patient takes Eliquis  2.5 mg twice daily.  Occasionally he misses a dose. He reports left lower extremity cramps and swelling.  He denies any recent immobilization events.  He walks a lot during the past weekend and cramp and the swelling happened after that.  MEDICAL HISTORY:  Past Medical  History:  Diagnosis Date   Asthma    Collagen vascular disease    Rhematoid Arthritis   GERD (gastroesophageal reflux disease)    Gout    Hx of blood clots    Hypertension    Sleep apnea     SURGICAL HISTORY: Past Surgical History:  Procedure Laterality Date   ACHILLES TENDON REPAIR     COLONOSCOPY WITH PROPOFOL  N/A 12/13/2016   Procedure: COLONOSCOPY WITH PROPOFOL ;  Surgeon: Viktoria Lamar DASEN, MD;  Location: [facility] ENDOSCOPY;  Service: Endoscopy;  Laterality: N/A;   EYE SURGERY     floppy eye syndrome surgery   SHOULDER ARTHROSCOPY WITH OPEN ROTATOR CUFF REPAIR Left 03/24/2017   Procedure: SHOULDER ARTHROSCOPY WITH OPEN ROTATOR CUFF REPAIR;  Surgeon: Edie Norleen PARAS, MD;  Location: [facility] ORS;  Service: Orthopedics;  Laterality: Left;   UVULOPALATOPHARYNGOPLASTY      SOCIAL HISTORY: Social History   Socioeconomic History   Marital status: Single    Spouse name: Not on file   Number of children: Not on file   Years of education: Not on file   Highest education level: Not on file  Occupational History   Not on file  Tobacco Use   Smoking status: Never    Passive exposure: Never   Smokeless tobacco: Never  Vaping Use   Vaping status: Never Used  Substance and Sexual Activity   Alcohol use: No   Drug use: No   Sexual activity: Not Currently  Other Topics Concern   Not on file  Social History Narrative   Not on file   Social Drivers of Health   Financial Resource Strain: Low Risk  (03/25/2023)   Overall Financial Resource Strain (CARDIA)    Difficulty of Paying Living Expenses: Not hard at all  Food Insecurity: No Food Insecurity (03/25/2023)   Hunger Vital Sign    Worried About Running Out of Food in the Last Year: Never true    Ran Out of Food in the Last Year: Never true  Transportation Needs: No Transportation Needs (03/25/2023)   PRAPARE - Administrator, Civil Service (Medical): No    Lack of Transportation (Non-Medical): No  Physical Activity: Not on  file  Stress: Not on file  Social Connections: Not on file  Intimate Partner Violence: Not At Risk (03/25/2023)   Humiliation, Afraid, Rape, and Kick questionnaire    Fear of Current or Ex-Partner: No    Emotionally Abused: No    Physically Abused: No    Sexually Abused: No    FAMILY HISTORY: Family History  Problem Relation Age of Onset   CAD Father    Lung cancer Father    CAD Brother     ALLERGIES:  is allergic to penicillins.  MEDICATIONS:  Current Outpatient Medications  Medication Sig Dispense Refill   acetaminophen  (TYLENOL ) 500 MG tablet Take 2 tablets (1,000 mg total) by mouth every 6 (six) hours as needed. 100 tablet 2   albuterol  (VENTOLIN  HFA) 108 (90 Base) MCG/ACT inhaler Inhale 2 puffs into the lungs every 6 (six) hours as needed for wheezing or shortness of breath.  8 g 1   allopurinol  (ZYLOPRIM ) 100 MG tablet Take 1 tablet (100 mg total) by mouth daily. 90 tablet 1   Ascorbic Acid  (VITAMIN C) 1000 MG tablet Take 1,000 mg by mouth daily.     cholecalciferol (VITAMIN D3) 25 MCG (1000 UNIT) tablet Take 1,000 Units by mouth daily.     Fluticasone-Umeclidin-Vilant (TRELEGY ELLIPTA ) 100-62.5-25 MCG/ACT AEPB Inhale 1 puff into the lungs daily. 1 each 11   pantoprazole  (PROTONIX ) 20 MG tablet Take 1 tablet (20 mg total) by mouth daily as needed for heartburn. 90 tablet 0   sildenafil  (REVATIO ) 20 MG tablet Take 1 tablet (20 mg total) by mouth daily. As needed 10 tablet 2   zinc  gluconate 50 MG tablet Take 50 mg by mouth daily.     apixaban  (ELIQUIS ) 2.5 MG TABS tablet Take 1 tablet (2.5 mg total) by mouth 2 (two) times daily. 60 tablet 6   tirzepatide  (ZEPBOUND ) 2.5 MG/0.5ML Pen Inject 2.5 mg into the skin once a week. (Patient not taking: Reported on 02/08/2024) 2 mL 0   No current facility-administered medications for this visit.    Review of Systems  Constitutional:  Negative for appetite change, chills, fatigue, fever and unexpected weight change.  HENT:   Negative  for hearing loss and voice change.   Eyes:  Negative for eye problems and icterus.  Respiratory:  Negative for chest tightness, cough and shortness of breath.   Cardiovascular:  Positive for leg swelling. Negative for chest pain.  Gastrointestinal:  Negative for abdominal distention and abdominal pain.  Endocrine: Negative for hot flashes.  Genitourinary:  Negative for difficulty urinating, dysuria and frequency.   Musculoskeletal:  Positive for arthralgias.  Skin:  Negative for itching and rash.  Neurological:  Negative for light-headedness and numbness.  Hematological:  Negative for adenopathy. Does not bruise/bleed easily.  Psychiatric/Behavioral:  Negative for confusion.    PHYSICAL EXAMINATION: ECOG PERFORMANCE STATUS: 1 - Symptomatic but completely ambulatory Vitals:   02/08/24 1352 02/08/24 1358  BP: (!) 146/85 132/85  Pulse: 73   Resp: 18   Temp: (!) 97.2 F (36.2 C)   SpO2: 92%    Filed Weights   02/08/24 1352  Weight: (!) 331 lb 6.4 oz (150.3 kg)    Physical Exam Constitutional:      General: He is not in acute distress.    Appearance: He is obese.  HENT:     Head: Normocephalic and atraumatic.  Eyes:     General: No scleral icterus. Cardiovascular:     Rate and Rhythm: Normal rate and regular rhythm.  Pulmonary:     Effort: Pulmonary effort is normal. No respiratory distress.     Breath sounds: Normal breath sounds. No wheezing.  Abdominal:     General: Bowel sounds are normal. There is no distension.     Palpations: Abdomen is soft.  Musculoskeletal:        General: No deformity. Normal range of motion.     Cervical back: Normal range of motion and neck supple.     Left lower leg: Edema present.  Skin:    General: Skin is warm and dry.     Findings: No erythema or rash.  Neurological:     Mental Status: He is alert and oriented to person, place, and time. Mental status is at baseline.  Psychiatric:        Mood and Affect: Mood normal.      LABORATORY DATA:  I have reviewed the data as  listed    Latest Ref Rng & Units 02/08/2024    1:14 PM 08/21/2023    5:07 AM 07/01/2023   11:08 AM  CBC  WBC 4.0 - 10.5 K/uL 8.0  6.9  6.9   Hemoglobin 13.0 - 17.0 g/dL 85.9  86.0  85.4   Hematocrit 39.0 - 52.0 % 41.8  42.8  44.3   Platelets 150 - 400 K/uL 255  254  257       Latest Ref Rng & Units 02/08/2024    1:14 PM 08/21/2023    5:07 AM 07/01/2023   11:08 AM  CMP  Glucose 70 - 99 mg/dL 92  872  92   BUN 8 - 23 mg/dL 17  18  18    Creatinine 0.61 - 1.24 mg/dL 9.00  8.68  8.93   Sodium 135 - 145 mmol/L 136  137  141   Potassium 3.5 - 5.1 mmol/L 4.0  4.4  4.7   Chloride 98 - 111 mmol/L 105  103  107   CO2 22 - 32 mmol/L 22  26  26    Calcium 8.9 - 10.3 mg/dL 8.8  9.1  9.0   Total Protein 6.5 - 8.1 g/dL 7.9   8.1   Total Bilirubin 0.0 - 1.2 mg/dL 1.1   1.0   Alkaline Phos 38 - 126 U/L 65   73   AST 15 - 41 U/L 19   18   ALT 0 - 44 U/L 21   18       RADIOGRAPHIC STUDIES: I have personally reviewed the radiological images as listed and agreed with the findings in the report. DG Chest 2 View Result Date: 12/12/2023 CLINICAL DATA:  Shortness of breath. EXAM: CHEST - 2 VIEW COMPARISON:  Chest radiograph dated 05/15/2021 FINDINGS: No focal consolidation, pleural effusion, pneumothorax. The cardiac silhouette is within normal limits. No acute osseous pathology. Old healed right lateral rib fractures. IMPRESSION: No active cardiopulmonary disease. Electronically Signed   By: Vanetta Chou M.D.   On: 12/12/2023 20:24

## 2024-02-08 NOTE — Assessment & Plan Note (Signed)
 Recommend leg elevation, wearing compression stocking. Left lower extremity cramp and swelling, check venous ultrasound to rule out recurrent DVT.

## 2024-02-13 ENCOUNTER — Ambulatory Visit
Admission: RE | Admit: 2024-02-13 | Discharge: 2024-02-13 | Disposition: A | Source: Ambulatory Visit | Attending: Oncology

## 2024-02-13 DIAGNOSIS — M7989 Other specified soft tissue disorders: Secondary | ICD-10-CM | POA: Insufficient documentation

## 2024-02-13 DIAGNOSIS — I82532 Chronic embolism and thrombosis of left popliteal vein: Secondary | ICD-10-CM | POA: Diagnosis not present

## 2024-02-14 ENCOUNTER — Telehealth: Payer: Self-pay

## 2024-02-14 NOTE — Telephone Encounter (Signed)
 Per Dr. Babara: please let pt know that US  showed chronic changes after blood clot. no acute blood clot. continue eliquis  2.5mg  BID.   Pt has been contacted and notified of US  results and recommendation and has verbalized understanding.

## 2024-02-22 ENCOUNTER — Encounter: Payer: Self-pay | Admitting: Internal Medicine

## 2024-02-22 ENCOUNTER — Ambulatory Visit: Admitting: Internal Medicine

## 2024-02-22 VITALS — BP 120/80 | HR 77 | Ht 68.0 in | Wt 331.8 lb

## 2024-02-22 DIAGNOSIS — J4520 Mild intermittent asthma, uncomplicated: Secondary | ICD-10-CM

## 2024-02-22 DIAGNOSIS — G4733 Obstructive sleep apnea (adult) (pediatric): Secondary | ICD-10-CM | POA: Diagnosis not present

## 2024-02-22 DIAGNOSIS — J4540 Moderate persistent asthma, uncomplicated: Secondary | ICD-10-CM

## 2024-02-22 MED ORDER — TRELEGY ELLIPTA 100-62.5-25 MCG/ACT IN AEPB: 1.0000 | INHALATION_SPRAY | Freq: Every day | RESPIRATORY_TRACT | 11 refills | Status: AC

## 2024-02-22 NOTE — Patient Instructions (Addendum)
 Continue Trelegy as prescribed Please rinse mouth after use  Avoid Allergens and Irritants Avoid secondhand smoke Avoid SICK contacts Recommend  Masking  when appropriate Recommend Keep up-to-date with vaccinations  Recommend referral to Dr. Jess for sleep evaluation and assess for Zepbound 

## 2024-02-22 NOTE — Progress Notes (Signed)
 Name: Sean Garza MRN: [id number] DOB: [birth year]    CHIEF COMPLAINT:  ASSESSMENT OF SLEEP APNEA Assessment of asthma Diagnosis of DVT   HISTORY OF PRESENT ILLNESS:  Discussed sleep data and reviewed with patient.  Encouraged proper weight management.  Discussed sleep hygiene Patient uses and benefits from therapy Using CPAP nightly and with naps Settings are comfortable and is sleeping well. AHI significantly reduced to 4.2 However patient does have issues with mask Recommend traveling with CPAP Recommend referral to Dr. Jess for further evaluation and for step on evaluation   Patient currently weighs 330 pounds  Patient with history of COVID in 2021 was hospitalized for 2 weeks Was out of work for 5-1/2 months Ever since then patient has had increased work of breathing shortness of breath placed on Trelegy inhaler It seems to be helping however will increase the dose Patient is a non-smoker however had significant secondhand smoke exposure from parents Father died of lung cancer Patient currently on Trelegy inhaler which has helped Will continue as prescribed  No exacerbation at this time No evidence of heart failure at this time No evidence or signs of infection at this time No respiratory distress No fevers, chills, nausea, vomiting, diarrhea No evidence of lower extremity edema No evidence hemoptysis   Patient with history of DVT right leg due to surgery which was provoked Patient history with a left leg DVT unprovoked Currently on anticoagulation Followed by Dr. Babara with hematology    12/07/2023    9:00 AM  Results of the Epworth flowsheet  Sitting and reading 3  Watching TV 3  Sitting, inactive in a public place (e.g. a theatre or a meeting) 0  As a passenger in a car for an hour without a break 1  Lying down to rest in the afternoon when circumstances permit 2  Sitting and talking to someone 0  Sitting quietly after a lunch without alcohol 1  In  a car, while stopped for a few minutes in traffic 0  Total score 10      PAST MEDICAL HISTORY :   has a past medical history of Asthma, Collagen vascular disease, GERD (gastroesophageal reflux disease), Gout, blood clots, Hypertension, and Sleep apnea.  has a past surgical history that includes Achilles tendon repair; Eye surgery; Colonoscopy with propofol  (N/A, 12/13/2016); Uvulopalatopharyngoplasty; and Shoulder arthroscopy with open rotator cuff repair (Left, 03/24/2017). Prior to Admission medications   Medication Sig Start Date End Date Taking? Authorizing Provider  acetaminophen  (TYLENOL ) 500 MG tablet Take 2 tablets (1,000 mg total) by mouth every 6 (six) hours as needed. 08/21/23 08/20/24  Clarine Ozell LABOR, MD  albuterol  (VENTOLIN  HFA) 108 (90 Base) MCG/ACT inhaler Inhale 2 puffs into the lungs every 6 (six) hours as needed for wheezing or shortness of breath. 08/12/23   Bernardo Fend, DO  apixaban  (ELIQUIS ) 2.5 MG TABS tablet Take 1 tablet (2.5 mg total) by mouth 2 (two) times daily. 07/01/23   Babara Call, MD  Ascorbic Acid  (VITAMIN C) 1000 MG tablet Take 1,000 mg by mouth daily.    [provider]  cholecalciferol (VITAMIN D3) 25 MCG (1000 UNIT) tablet Take 1,000 Units by mouth daily.    [provider]  Fluticasone-Umeclidin-Vilant (TRELEGY ELLIPTA ) 100-62.5-25 MCG/ACT AEPB Inhale 1 puff into the lungs daily. 09/13/23   Bernardo Fend, DO  pantoprazole  (PROTONIX ) 20 MG tablet Take 1 tablet (20 mg total) by mouth daily as needed for heartburn. 08/12/23   Bernardo Fend, DO  sildenafil  (REVATIO )  20 MG tablet Take 20 mg by mouth daily. As needed    [provider]  tirzepatide  (ZEPBOUND ) 2.5 MG/0.5ML Pen Inject 2.5 mg into the skin once a week. 09/13/23   Bernardo Fend, DO  zinc  gluconate 50 MG tablet Take 50 mg by mouth daily.    [provider]   Allergies  Allergen Reactions   Penicillins Other (See Comments)    Childhood reaction. Has  patient had a PCN reaction causing immediate rash, facial/tongue/throat swelling, SOB or lightheadedness with hypotension: Unknown Has patient had a PCN reaction causing severe rash involving mucus membranes or skin necrosis: Unknown Has patient had a PCN reaction that required hospitalization: No Has patient had a PCN reaction occurring within the last 10 years: No If all of the above answers are NO, then may proceed with Cephalosporin use.     FAMILY HISTORY:  family history includes CAD in his brother and father; Lung cancer in his father. SOCIAL HISTORY:  reports that he has never smoked. He has never been exposed to tobacco smoke. He has never used smokeless tobacco. He reports that he does not drink alcohol and does not use drugs.   BP 120/80   Pulse 77   Ht 5' 8 (1.727 m)   Wt (!) 331 lb 12.8 oz (150.5 kg)   SpO2 97%   BMI 50.45 kg/m   Alert awake following commands No respiratory distress no wheezing S1-S2 no murmurs Pulses intact No focal deficits      ASSESSMENT AND PLAN SYNOPSIS  63 year old pleasant white male morbidly obese with deconditioned state with signs and symptoms of obstructive sleep apnea in the setting of mild to moderate persistent asthma which has worsened since his COVID diagnosis with underlying left leg DVT  Assessment of OSA Continue CPAP as prescribed Recommend increased compliance Recommend travel with CPAP Referral to Dr. Jess for further evaluation Consider Zepbound  for weight loss  Assessment of asthma Mild to moderate Continue Trelegy 100 1 puff once a day Rinse mouth after use Albuterol  as needed    History of DVT unprovoked Previous history of COVID It seems that patient will be on anticoagulation therapy indefinitely Follow-up with hematology   Obesity -recommend significant weight loss -recommend changing diet  Deconditioned state -Recommend increased daily activity and exercise    MEDICATION  ADJUSTMENTS/LABS AND TESTS ORDERED: Continue CPAP Increased compliance referral to Dr. Jess  Zepbound  evaluation  Continue Trelegy  Avoid Allergens and Irritants Avoid secondhand smoke Avoid SICK contacts Recommend  Masking  when appropriate Recommend Keep up-to-date with vaccinations    CURRENT MEDICATIONS REVIEWED AT LENGTH WITH PATIENT TODAY   Patient  satisfied with Plan of action and management. All questions answered   Follow up 6 months   I spent a total of 41 minutes dedicated to the care of this patient on the date of this encounter to include pre-visit review of records, face-to-face time with the patient discussing conditions above, post visit ordering of testing, clinical documentation with the electronic health record, making appropriate referrals as documented, and communicating necessary information to the patient's healthcare team.     Nickolas Alm Cellar, M.D.  [facility]  Medical Director [facility] Medical Director [facility] Cardio-Pulmonary Department

## 2024-02-29 ENCOUNTER — Ambulatory Visit: Admitting: Sleep Medicine

## 2024-02-29 DIAGNOSIS — G4733 Obstructive sleep apnea (adult) (pediatric): Secondary | ICD-10-CM | POA: Diagnosis not present

## 2024-04-09 ENCOUNTER — Ambulatory Visit: Payer: Self-pay

## 2024-04-09 ENCOUNTER — Ambulatory Visit: Admitting: Internal Medicine

## 2024-04-09 NOTE — Telephone Encounter (Signed)
 FYI Only or Action Required?: Action required by provider: request for appointment.  Patient was last seen in primary care on 01/31/2024 by Bernardo Fend, DO.  Called Nurse Triage reporting Cough.  Symptoms began several days ago.  Interventions attempted: Prescription medications: asthma inhaler.  Symptoms are: gradually worsening.Productive cough, runny nose, SOB, wheezing.  Triage Disposition: See Physician Within 24 Hours  Patient/caregiver understands and will follow disposition?:        Summary: Shortness of breath, productive cough   Reason for Triage: Pt calling reports he was contacted to reschedule an appt today for Congestion, runny nose, sore throat due to office closure for weather.  Pt reports symptoms has worsened, is now having asthma flare up, increased wheezing, and productive cough, with yellow mucous.         Reason for Disposition  [1] Continuous (nonstop) coughing interferes with work or school AND [2] no improvement using cough treatment per Care Advice  Answer Assessment - Initial Assessment Questions 1. ONSET: When did the cough begin?      Thursday 2. SEVERITY: How bad is the cough today?      severe 3. SPUTUM: Describe the color of your sputum (e.g., none, dry cough; clear, white, yellow, green)     yellow 4. HEMOPTYSIS: Are you coughing up any blood? If Yes, ask: How much? (e.g., flecks, streaks, tablespoons, etc.)     no 5. DIFFICULTY BREATHING: Are you having difficulty breathing? If Yes, ask: How bad is it? (e.g., mild, moderate, severe)      exertion 6. FEVER: Do you have a fever? If Yes, ask: What is your temperature, how was it measured, and when did it start?     no 7. CARDIAC HISTORY: Do you have any history of heart disease? (e.g., heart attack, congestive heart failure)      no 8. LUNG HISTORY: Do you have any history of lung disease?  (e.g., pulmonary embolus, asthma, emphysema)     asthma 9. PE RISK  FACTORS: Do you have a history of blood clots? (or: recent major surgery, recent prolonged travel, bedridden)     no 10. OTHER SYMPTOMS: Do you have any other symptoms? (e.g., runny nose, wheezing, chest pain)       wheezing 11. PREGNANCY: Is there any chance you are pregnant? When was your last menstrual period?       N/a 12. TRAVEL: Have you traveled out of the country in the last month? (e.g., travel history, exposures)       no  Protocols used: Cough - Acute Productive-A-AH

## 2024-04-10 ENCOUNTER — Ambulatory Visit: Admitting: Internal Medicine

## 2024-04-10 ENCOUNTER — Telehealth: Payer: Self-pay | Admitting: Pharmacist

## 2024-04-10 ENCOUNTER — Other Ambulatory Visit (HOSPITAL_COMMUNITY): Payer: Self-pay

## 2024-04-10 ENCOUNTER — Encounter: Payer: Self-pay | Admitting: Internal Medicine

## 2024-04-10 ENCOUNTER — Other Ambulatory Visit: Payer: Self-pay

## 2024-04-10 VITALS — BP 128/82 | HR 95 | Temp 97.9°F | Resp 18 | Ht 68.0 in | Wt 330.1 lb

## 2024-04-10 DIAGNOSIS — M1990 Unspecified osteoarthritis, unspecified site: Secondary | ICD-10-CM | POA: Diagnosis not present

## 2024-04-10 DIAGNOSIS — Z6841 Body Mass Index (BMI) 40.0 and over, adult: Secondary | ICD-10-CM

## 2024-04-10 DIAGNOSIS — E66813 Obesity, class 3: Secondary | ICD-10-CM

## 2024-04-10 DIAGNOSIS — J4541 Moderate persistent asthma with (acute) exacerbation: Secondary | ICD-10-CM

## 2024-04-10 DIAGNOSIS — J029 Acute pharyngitis, unspecified: Secondary | ICD-10-CM | POA: Diagnosis not present

## 2024-04-10 DIAGNOSIS — R7303 Prediabetes: Secondary | ICD-10-CM

## 2024-04-10 DIAGNOSIS — J40 Bronchitis, not specified as acute or chronic: Secondary | ICD-10-CM | POA: Diagnosis not present

## 2024-04-10 DIAGNOSIS — G4733 Obstructive sleep apnea (adult) (pediatric): Secondary | ICD-10-CM

## 2024-04-10 LAB — POCT RAPID STREP A (OFFICE): Rapid Strep A Screen: NEGATIVE

## 2024-04-10 MED ORDER — PREDNISONE 20 MG PO TABS: 40.0000 mg | ORAL_TABLET | Freq: Every day | ORAL | 0 refills | Status: AC

## 2024-04-10 MED ORDER — ZEPBOUND 2.5 MG/0.5ML ~~LOC~~ SOAJ: 2.5000 mg | SUBCUTANEOUS | 0 refills | Status: AC

## 2024-04-10 MED ORDER — AZITHROMYCIN 250 MG PO TABS: ORAL_TABLET | ORAL | 0 refills | Status: AC

## 2024-04-10 NOTE — Telephone Encounter (Signed)
 Pharmacy Patient Advocate Encounter   Received notification from [facility] that prior authorization for Zepbound  2.5mg /0.15ml is required/requested.   Insurance verification completed.   The patient is insured through [facility].   Per test claim: PA required; PA submitted to above mentioned insurance via Latent Key/confirmation #/EOC [id number] Status is pending

## 2024-04-10 NOTE — Progress Notes (Signed)
 "  Acute Office Visit  Subjective:     Patient ID: DERELLE COCKRELL, male    DOB: 03-12-1961, 64 y.o.   MRN: [id number]  Chief Complaint  Patient presents with   Cough    Yellow mucous and phlegm for 6 days    Cough Associated symptoms include ear pain, a sore throat, shortness of breath and wheezing. Pertinent negatives include no chills or fever.   Patient is in today for productive cough.   Discussed the use of AI scribe software for clinical note transcription with the patient, who gave verbal consent to proceed.  History of Present Illness MARKS SCALERA is a 64 year old male with asthma who presents with worsening respiratory symptoms.  Since around [place] he has had cough, nasal congestion, and yellow mucus that initially improved but has worsened over the past six days. He has sore throat and left ear discomfort with a plugged sensation. He has shortness of breath with activity and increased wheezing related to asthma. He is using albuterol  about six times daily with temporary relief and uses Trelegy for asthma. For congestion he has been using Mucinex  every four hours and Flonase twice daily, with some benefit, though he ran out of Mucinex  last night. He denies fever and sinus pain.   Review of Systems  Constitutional:  Positive for malaise/fatigue. Negative for chills and fever.  HENT:  Positive for congestion, ear pain and sore throat. Negative for sinus pain.   Respiratory:  Positive for cough, sputum production, shortness of breath and wheezing.         Objective:    BP 128/82 (Cuff Size: Large)   Pulse 95   Temp 97.9 F (36.6 C) (Oral)   Resp 18   Ht 5' 8 (1.727 m)   Wt (!) 330 lb 1.6 oz (149.7 kg)   SpO2 95%   BMI 50.19 kg/m    Physical Exam Constitutional:      Appearance: Normal appearance. He is ill-appearing.  HENT:     Head: Normocephalic and atraumatic.     Right Ear: Tympanic membrane, ear canal and external ear normal.     Left Ear:  Tympanic membrane, ear canal and external ear normal.     Nose: Congestion present.     Mouth/Throat:     Mouth: Mucous membranes are moist.     Pharynx: Posterior oropharyngeal erythema present.  Eyes:     Conjunctiva/sclera: Conjunctivae normal.  Cardiovascular:     Rate and Rhythm: Normal rate and regular rhythm.  Pulmonary:     Effort: Pulmonary effort is normal.     Breath sounds: Wheezing present. No rhonchi or rales.  Skin:    General: Skin is warm and dry.  Neurological:     General: No focal deficit present.     Mental Status: He is alert. Mental status is at baseline.  Psychiatric:        Mood and Affect: Mood normal.        Behavior: Behavior normal.     No results found for any visits on 04/10/24.      Assessment & Plan:   Assessment & Plan Moderate persistent asthma with acute exacerbation Asthma exacerbation likely due to viral infection, increased albuterol  use noted. No active wheezing, reduced lung expansion.  - Prescribed prednisone  40 mg daily for 5 days. - Continue albuterol  as needed.  Acute bronchitis Cough with yellow mucus, congestion, sore throat. Fever present. Viral bronchitis with potential bacterial superinfection considered.  Azithromycin  prescribed for bacterial coverage. - Prescribed azithromycin  with a double dose on the first day, followed by one pill daily for 5 days. - Performed rapid strep test to rule out streptococcal infection which was negative.   Class 3 severe obesity Discussed weight management options, including Zepbound  and Wegovy. Insurance coverage issues noted, potential out-of-pocket costs for [employer]. Zepbound  prescription refilled to trigger insurance approval. - Refilled Zepbound  prescription to trigger insurance approval process. - Discussed Georjean as an alternative if Zepbound  is not covered.  Obstructive sleep apnea syndrome Sleep apnea management affected by recent illness impacting CPAP use. Insurance coverage for  weight management medications discussed in relation to sleep apnea treatment.  - azithromycin  (ZITHROMAX ) 250 MG tablet; Take 2 tablets on day 1, then 1 tablet daily on days 2 through 5  Dispense: 6 tablet; Refill: 0 - predniSONE  (DELTASONE ) 20 MG tablet; Take 2 tablets (40 mg total) by mouth daily with breakfast for 5 days.  Dispense: 10 tablet; Refill: 0 - tirzepatide  (ZEPBOUND ) 2.5 MG/0.5ML Pen; Inject 2.5 mg into the skin once a week.  Dispense: 2 mL; Refill: 0 - POCT rapid strep A   Return if symptoms worsen or fail to improve.  Sharyle Fischer, DO   "

## 2024-04-11 ENCOUNTER — Other Ambulatory Visit (HOSPITAL_COMMUNITY): Payer: Self-pay

## 2024-04-11 NOTE — Telephone Encounter (Signed)
 Pharmacy Patient Advocate Encounter  Received notification from [employer] that Prior Authorization for tirzepatide  (ZEPBOUND ) 2.5 MG/0.5ML Pen has been APPROVED from 02/10/2024 to 11/08/2024. Ran test claim, Copay is $30. This test claim was processed through [facility]- copay amounts may vary at other pharmacies due to pharmacy/plan contracts, or as the patient moves through the different stages of their insurance plan.   PA #/Case ID/Reference #: [id number]

## 2024-04-19 NOTE — Telephone Encounter (Unsigned)
 Copied from CRM [id number]. Topic: General - Other >> Apr 18, 2024  4:16 PM Winona SAUNDERS wrote: RICK for Bernardo, Pt Insurance aproved zepbound  shots. Pt started it last night his girlfriend showed him how. He just wanted to let Bernardo know

## 2024-05-03 ENCOUNTER — Ambulatory Visit: Admitting: Internal Medicine

## 2024-08-07 ENCOUNTER — Inpatient Hospital Stay: Admitting: Oncology

## 2024-08-07 ENCOUNTER — Inpatient Hospital Stay
